# Patient Record
Sex: Male | Born: 1960 | Race: White | Hispanic: No | Marital: Married | Smoking: Never smoker
Health system: Southern US, Community
[De-identification: ages and names within clinical notes are randomized; demographics above are authoritative.]

## PROBLEM LIST (undated history)

## (undated) DIAGNOSIS — K7460 Unspecified cirrhosis of liver: Secondary | ICD-10-CM

## (undated) DIAGNOSIS — F1010 Alcohol abuse, uncomplicated: Secondary | ICD-10-CM

## (undated) DIAGNOSIS — I2510 Atherosclerotic heart disease of native coronary artery without angina pectoris: Secondary | ICD-10-CM

## (undated) DIAGNOSIS — E785 Hyperlipidemia, unspecified: Secondary | ICD-10-CM

## (undated) DIAGNOSIS — R0600 Dyspnea, unspecified: Secondary | ICD-10-CM

## (undated) DIAGNOSIS — E119 Type 2 diabetes mellitus without complications: Secondary | ICD-10-CM

## (undated) DIAGNOSIS — I10 Essential (primary) hypertension: Secondary | ICD-10-CM

## (undated) HISTORY — DX: Hyperlipidemia, unspecified: E78.5

## (undated) HISTORY — PX: NO PAST SURGERIES: SHX2092

## (undated) HISTORY — DX: Atherosclerotic heart disease of native coronary artery without angina pectoris: I25.10

## (undated) HISTORY — DX: Type 2 diabetes mellitus without complications: E11.9

---

## 2002-10-10 ENCOUNTER — Encounter: Payer: Self-pay | Admitting: Emergency Medicine

## 2002-10-10 ENCOUNTER — Emergency Department (HOSPITAL_COMMUNITY): Admission: AD | Admit: 2002-10-10 | Discharge: 2002-10-10 | Payer: Self-pay | Admitting: Emergency Medicine

## 2004-04-01 ENCOUNTER — Encounter: Admission: RE | Admit: 2004-04-01 | Discharge: 2004-04-01 | Payer: Self-pay | Admitting: Neurological Surgery

## 2004-04-02 ENCOUNTER — Encounter: Admission: RE | Admit: 2004-04-02 | Discharge: 2004-04-02 | Payer: Self-pay | Admitting: Neurological Surgery

## 2004-07-15 ENCOUNTER — Ambulatory Visit: Payer: Self-pay | Admitting: Family Medicine

## 2004-07-28 ENCOUNTER — Ambulatory Visit (HOSPITAL_BASED_OUTPATIENT_CLINIC_OR_DEPARTMENT_OTHER): Admission: RE | Admit: 2004-07-28 | Discharge: 2004-07-28 | Payer: Self-pay | Admitting: Family Medicine

## 2004-07-30 ENCOUNTER — Ambulatory Visit: Payer: Self-pay | Admitting: Internal Medicine

## 2004-10-23 ENCOUNTER — Ambulatory Visit: Payer: Self-pay | Admitting: Family Medicine

## 2004-10-30 ENCOUNTER — Ambulatory Visit: Payer: Self-pay | Admitting: Family Medicine

## 2018-07-28 ENCOUNTER — Emergency Department (HOSPITAL_COMMUNITY): Payer: Managed Care, Other (non HMO)

## 2018-07-28 ENCOUNTER — Inpatient Hospital Stay (HOSPITAL_COMMUNITY)
Admission: EM | Admit: 2018-07-28 | Discharge: 2018-08-06 | DRG: 897 | Disposition: A | Discharge status: Home Health | Payer: Managed Care, Other (non HMO) | Attending: Family Medicine | Admitting: Family Medicine

## 2018-07-28 ENCOUNTER — Other Ambulatory Visit: Payer: Self-pay

## 2018-07-28 ENCOUNTER — Encounter (HOSPITAL_COMMUNITY): Payer: Self-pay

## 2018-07-28 DIAGNOSIS — Z23 Encounter for immunization: Secondary | ICD-10-CM

## 2018-07-28 DIAGNOSIS — R17 Unspecified jaundice: Secondary | ICD-10-CM | POA: Diagnosis not present

## 2018-07-28 DIAGNOSIS — E785 Hyperlipidemia, unspecified: Secondary | ICD-10-CM | POA: Diagnosis present

## 2018-07-28 DIAGNOSIS — F10231 Alcohol dependence with withdrawal delirium: Principal | ICD-10-CM | POA: Diagnosis present

## 2018-07-28 DIAGNOSIS — R05 Cough: Secondary | ICD-10-CM | POA: Diagnosis present

## 2018-07-28 DIAGNOSIS — E871 Hypo-osmolality and hyponatremia: Secondary | ICD-10-CM | POA: Diagnosis present

## 2018-07-28 DIAGNOSIS — F10232 Alcohol dependence with withdrawal with perceptual disturbance: Secondary | ICD-10-CM | POA: Diagnosis present

## 2018-07-28 DIAGNOSIS — I10 Essential (primary) hypertension: Secondary | ICD-10-CM | POA: Diagnosis present

## 2018-07-28 DIAGNOSIS — K700 Alcoholic fatty liver: Secondary | ICD-10-CM | POA: Diagnosis present

## 2018-07-28 DIAGNOSIS — B9789 Other viral agents as the cause of diseases classified elsewhere: Secondary | ICD-10-CM | POA: Diagnosis not present

## 2018-07-28 DIAGNOSIS — R000 Tachycardia, unspecified: Secondary | ICD-10-CM | POA: Diagnosis not present

## 2018-07-28 DIAGNOSIS — R410 Disorientation, unspecified: Secondary | ICD-10-CM | POA: Diagnosis not present

## 2018-07-28 DIAGNOSIS — F10939 Alcohol use, unspecified with withdrawal, unspecified: Secondary | ICD-10-CM | POA: Diagnosis present

## 2018-07-28 DIAGNOSIS — R7401 Elevation of levels of liver transaminase levels: Secondary | ICD-10-CM

## 2018-07-28 DIAGNOSIS — R4182 Altered mental status, unspecified: Secondary | ICD-10-CM | POA: Diagnosis not present

## 2018-07-28 DIAGNOSIS — F10239 Alcohol dependence with withdrawal, unspecified: Secondary | ICD-10-CM | POA: Diagnosis present

## 2018-07-28 DIAGNOSIS — R740 Nonspecific elevation of levels of transaminase and lactic acid dehydrogenase [LDH]: Secondary | ICD-10-CM | POA: Diagnosis not present

## 2018-07-28 DIAGNOSIS — Z20828 Contact with and (suspected) exposure to other viral communicable diseases: Secondary | ICD-10-CM | POA: Diagnosis present

## 2018-07-28 DIAGNOSIS — F419 Anxiety disorder, unspecified: Secondary | ICD-10-CM | POA: Diagnosis present

## 2018-07-28 DIAGNOSIS — F10932 Alcohol use, unspecified with withdrawal with perceptual disturbance: Secondary | ICD-10-CM

## 2018-07-28 DIAGNOSIS — J069 Acute upper respiratory infection, unspecified: Secondary | ICD-10-CM | POA: Diagnosis present

## 2018-07-28 DIAGNOSIS — R030 Elevated blood-pressure reading, without diagnosis of hypertension: Secondary | ICD-10-CM

## 2018-07-28 DIAGNOSIS — F1020 Alcohol dependence, uncomplicated: Secondary | ICD-10-CM | POA: Diagnosis present

## 2018-07-28 HISTORY — DX: Alcohol abuse, uncomplicated: F10.10

## 2018-07-28 LAB — COMPREHENSIVE METABOLIC PANEL
ALT: 34 U/L (ref 0–44)
AST: 89 U/L — ABNORMAL HIGH (ref 15–41)
Albumin: 3.9 g/dL (ref 3.5–5.0)
Alkaline Phosphatase: 179 U/L — ABNORMAL HIGH (ref 38–126)
Anion gap: 12 (ref 5–15)
BUN: 10 mg/dL (ref 6–20)
CO2: 21 mmol/L — ABNORMAL LOW (ref 22–32)
Calcium: 10.2 mg/dL (ref 8.9–10.3)
Chloride: 98 mmol/L (ref 98–111)
Creatinine, Ser: 0.81 mg/dL (ref 0.61–1.24)
GFR calc Af Amer: >60 mL/min (ref >60)
GFR calc non Af Amer: >60 mL/min (ref >60)
Glucose, Bld: 132 mg/dL — ABNORMAL HIGH (ref 70–99)
Potassium: 3.6 mmol/L (ref 3.5–5.1)
Sodium: 131 mmol/L — ABNORMAL LOW (ref 135–145)
Total Bilirubin: 4.6 mg/dL — ABNORMAL HIGH (ref 0.3–1.2)
Total Protein: 8.5 g/dL — ABNORMAL HIGH (ref 6.5–8.1)

## 2018-07-28 LAB — CBC WITH DIFFERENTIAL/PLATELET
Abs Immature Granulocytes: 0.06 10*3/uL (ref 0.00–0.07)
Basophils Absolute: 0 10*3/uL (ref 0.0–0.1)
Basophils Relative: 1 %
Eosinophils Absolute: 0.2 10*3/uL (ref 0.0–0.5)
Eosinophils Relative: 2 %
HCT: 42 % (ref 39.0–52.0)
Hemoglobin: 14.3 g/dL (ref 13.0–17.0)
Immature Granulocytes: 1 %
Lymphocytes Relative: 11 %
Lymphs Abs: 0.7 10*3/uL (ref 0.7–4.0)
MCH: 34 pg (ref 26.0–34.0)
MCHC: 34 g/dL (ref 30.0–36.0)
MCV: 99.8 fL (ref 80.0–100.0)
Monocytes Absolute: 0.7 10*3/uL (ref 0.1–1.0)
Monocytes Relative: 11 %
Neutro Abs: 4.9 10*3/uL (ref 1.7–7.7)
Neutrophils Relative %: 74 %
Platelets: UNDETERMINED 10*3/uL (ref 150–400)
RBC: 4.21 MIL/uL — ABNORMAL LOW (ref 4.22–5.81)
RDW: 12.1 % (ref 11.5–15.5)
WBC: 6.6 10*3/uL (ref 4.0–10.5)
nRBC: 0 % (ref 0.0–0.2)

## 2018-07-28 LAB — ETHANOL: Alcohol, Ethyl (B): <10 mg/dL (ref <10)

## 2018-07-28 LAB — CBG MONITORING, ED: Glucose-Capillary: 131 mg/dL — ABNORMAL HIGH (ref 70–99)

## 2018-07-28 LAB — LIPID PANEL
Cholesterol: 224 mg/dL — ABNORMAL HIGH (ref 0–200)
HDL: 42 mg/dL (ref >40)
LDL Cholesterol: 162 mg/dL — ABNORMAL HIGH (ref 0–99)
Total CHOL/HDL Ratio: 5.3 RATIO
Triglycerides: 98 mg/dL (ref <150)
VLDL: 20 mg/dL (ref 0–40)

## 2018-07-28 MED ORDER — LACTATED RINGERS IV BOLUS
1000.0000 mL | Freq: Once | INTRAVENOUS | Status: AC
  Administered 2018-07-28: 1000 mL via INTRAVENOUS

## 2018-07-28 MED ORDER — LORAZEPAM 2 MG/ML IJ SOLN
1.0000 mg | Freq: Once | INTRAMUSCULAR | Status: AC
  Administered 2018-07-28: 17:00:00 1 mg via INTRAVENOUS
  Filled 2018-07-28: qty 1

## 2018-07-28 MED ORDER — LORAZEPAM 2 MG/ML IJ SOLN
2.0000 mg | INTRAMUSCULAR | Status: DC | PRN
  Administered 2018-07-28 – 2018-07-29 (×4): 2 mg via INTRAVENOUS
  Administered 2018-07-29: 3 mg via INTRAVENOUS
  Administered 2018-07-29 – 2018-07-31 (×7): 2 mg via INTRAVENOUS
  Filled 2018-07-28 (×2): qty 1
  Filled 2018-07-28: qty 2
  Filled 2018-07-28 (×11): qty 1

## 2018-07-28 MED ORDER — LORAZEPAM 2 MG/ML IJ SOLN
1.0000 mg | Freq: Once | INTRAMUSCULAR | Status: AC
  Administered 2018-07-28: 1 mg via INTRAVENOUS
  Filled 2018-07-28: qty 1

## 2018-07-28 MED ORDER — CHLORDIAZEPOXIDE HCL 25 MG PO CAPS: ORAL_CAPSULE | ORAL | 0 refills | Status: DC

## 2018-07-28 MED ORDER — LORAZEPAM 2 MG/ML IJ SOLN
1.0000 mg | Freq: Once | INTRAMUSCULAR | Status: AC
  Administered 2018-07-28: 19:00:00 1 mg via INTRAVENOUS
  Filled 2018-07-28: qty 1

## 2018-07-28 NOTE — ED Notes (Signed)
Pt reporting shortness of breat, a cough, and fevers since 07/24/18

## 2018-07-28 NOTE — H&P (Addendum)
[facility] Admission History and Physical Service Pager: [phone]  Patient name:  Leroy Bell Medical record number: [id number] Date of birth: 1960/05/13 Age: 58 y.o. Gender: male  Primary Care Provider: Patient, No Pcp Per Consultants: None Code Status: Full  Chief Complaint: Confusion, visual hallucinations, tremors, cough and fevers  Assessment and Plan: Leroy Bell is a 58 y.o. male presenting with confusion, visual hallucinations, tremors, cough and fevers at home in the setting of long history of alcohol abuse concerning for acute alcohol withdrawal. Patient has also reported cough, and fevers at home with self quarantined for the past few days suspicious for possible covid-19. PMH is significant for alcohol use disorder.  Acute alcohol withdrawal- last alcoholic drink on Sunday. patient actively tremulous and hallucinating/delirious/agitated while in ED. CIWA score 11>7>13.  Given 3 doses of Ativan and 2 liters LR in the ED. relevant labs significant for mild hyponatremia 131, AST 89, ALT 34, T bili 4.6, Alk Phos 179.  Physical exam showed jaundiced skin and sclera, obese abdomen negative for fluid wave, negative lower extremity edema, oriented x4, active tremor, negative nystagmus, active visual hallucinations and delirium. CXR showed no active cardiopulmonary process with an elevated left Hemidiaphragm.  Patient likely in acute alcohol withdrawal and needing admission to Step down unit for close monitoring of withdrawal symptoms and support for several days. No history of previous hospitalization for alcohol withdrawal or DTs.  May be difficult to assess tremor as a symptom of withdrawal 2/2 patient endorsing a chronic essential tremor. -Admit to stepdown, attending Dr. Mingo Amber -CIWA protocol, monitor score CCM consult prn. - Ativan with CIWA protocol - folate and thiamine - tylenol PRN - obtain alcohol level - Cmp and CBC  am - continuous cardiac  monitoring - social work consult once patient is stabilized to assist in drinking cessation - consider nicotine patch - reach out to wife for baseline information  Cough, Fevers at home, acute- no cough present during admission encounter.  Patient was afebrile since admission and appeared to have comfortable breathing on room air with 100% O2 sats despite being agitated from withdrawals. HEENT exam negative for infectious signs. Chest x-ray negative for active pulmonary disease. Patient reports fevers at home highest measured per patient 100.26F. He also endorses cough for a few days and reports wife had similar symptoms. Patient reports self isolation at home. He denies any know sick contact with covid patients or significant shortness of breath, however patient is a truck driver and given occupation and symptoms is at risk for exposure. Etiology likely viral upper respiratory infection that was associated with fever so will r/u COVID. Due to patient not having primary care follow-up for many years, and having elevated glucose on admission, would also recommend screening for diabetes as this is a risk factor for higher morbidity with covid coinfection. - Novel covid testing - Airborne and droplet precautions - Hemoglobin A1c --Follow up on RVP  #Transaminitis Patient with a long and significant history of alcohol abuse. He present with elevated AST and ALT in the classic pattern of 2:1 consistent with alcohol abuse. Patient also exhibited some scleral icterus consistent with his elevated T Bili of 4.6 and Alk Phos 179. CXR showed and elevated left Hemidiaphragm which also could be consistent with enlarged liver though will expect right hemidiaphragm elevation. However left hemi diaphragm elevation still possible given liver anatomy.  --Follow up on am CMP --Would consider GI consult given LFTs findings  --Continue Thiamine and folate --CIWA protocol  per above  #Hyponatremia On admission Na+ 131.  Patient with a known history of alcohol abuse. Suspect low sodium likely secondary to beer potomania and inappropriate diet. --Follow up on CMP --Continue supportive measures --Thiamine and Folate --Regular diet   Elevated blood pressure-patient denies history of hypertension and did not take any meds at home.  Hypertensive in emergency room.  Patient likely has essential hypertension undiagnosed since he does not follow with primary care, however, blood pressure most likely acutely elevated due to withdrawal symptoms.  Patient also tachycardic on admission which improved with Ativan treatment. - Monitor blood pressure - Set up patient for outpatient follow-up at discharge  FEN/GI: Normal diet Prophylaxis: lovenox  Disposition: admit to stepdown  History of Present Illness:  Leroy Bell is a 58 y.o. male presenting with history significant for acute alcohol withdrawal and recent upper respiratory infections symptoms.  Patient is unreliable historian due to withdrawal symptoms- giving inconsistent responses to questions. Does not have visitor with him due to isolation restrictions.  Respiratory infection-patient states he had 2 to 3 days of dry cough and was trying to isolate in his own bedroom at home.  Has not had any cough since Tuesday. his wife also had a cough worse than him but she is a smoker and has a chronic cough. Patient denies smoking or illicit drug use but does use chewing tobacco. He states that he has had a fever at home as high as 100.4 measured orally.  He denies shortness of breath, chest pain, rhinorrhea, odynophagia, ear pain, diarrhea, constipation, abdominal pain.  He denies having any sick contacts or any known Covid exposure.  Works as a Administrator.   Alcohol withdrawal-patient states that he drinks every day.  His last drink was on Sunday. Averages 6-12 beers per day.  The most he drinks in a day is 17 beers.  He sometimes starts drinking upon wakening in the  morning on "football "days.  He states that he has tried to stop drinking in the past.  He has stopped for multiple days before cold [place].  He has never been hospitalized for withdrawals in the past.  Denies any history of seizures or withdrawal symptoms.  Denies having any negative side effects of alcohol use to motivate his cessation.  He was just tired of drinking. When questioning the patient about hallucinations he denied having them but then started talking to people in the room who were not there multiple times during encounter.  ED physician signed out that patient admitted to multiple hallucinations at home.  Patient was not able to answer questions appropriately when investigating his withdrawal symptoms.  On initial entrance into the room patient was not oriented to self.  Upon reorientation patient was able to be oriented x4.  Patient received third dose of Ativan during encounter.  He had an active tremor in both hands and endorsed having chronic essential tremor.  Review Of Systems: Per HPI with the following additions:   Review of Systems  Constitutional: Positive for fever.  HENT: Negative for congestion, ear pain, sinus pain and sore throat.   Respiratory: Positive for cough. Negative for hemoptysis, sputum production and shortness of breath.   Cardiovascular: Negative for chest pain and palpitations.  Gastrointestinal: Negative for abdominal pain, nausea and vomiting.  Neurological: Positive for tremors. Negative for seizures.  Psychiatric/Behavioral: Positive for hallucinations and substance abuse.    Patient Active Problem List   Diagnosis Date Noted  . Alcohol withdrawal ([place]) 07/28/2018  Past Medical History: Past Medical History:  Diagnosis Date  . Alcohol abuse     Past Surgical History: Past Surgical History:  Procedure Laterality Date  . NECK SURGERY      Social History: Lives at home with wife.  Never smoker Positive chewing tobacco use Denies illicit  drug use Drinks 6-12 beers each day with as many as 17  Please also refer to relevant sections of EMR.  Family History: Non-contributory   Allergies and Medications: No Known Allergies  Objective: BP (!) 161/142   Pulse (!) 122   Temp 99.5 F (37.5 C) (Oral)   Resp (!) 34   SpO2 98%  Exam: General: agitated, delirious, cooperative Eyes: negative nystagmus, positive mild scleral icterus ENTM: negative nare or oropharynx edema, exudates or erythema Neck: negative cervico lymphadenitis or tenderness to palpation Cardiovascular: negative JVD, negative LE edema, quick capillary refill Respiratory: no increased WOB, no audible wheezing Gastrointestinal: abdomen obese and soft, negative fluid wave, negative hepatomegaly on exam which was limited 2/2 body habitus  MSK: normal appearing development Derm: no rashes or apparent lesions, but patient did have dried blood on hands and face without obvious source and patient did not know where it came from Neuro: negative focal deficits, negative nystagmus, bilateral tremor hands Psych: positive hallucinations apparent by patient talking about and to other people in the room who were not there  Labs and Imaging: CBC BMET  Recent Labs  Lab 07/28/18 1644  WBC 6.6  HGB 14.3  HCT 42.0  PLT PLATELET CLUMPS NOTED ON SMEAR, UNABLE TO ESTIMATE   Recent Labs  Lab 07/28/18 1644  NA 131*  K 3.6  CL 98  CO2 21*  BUN 10  CREATININE 0.81  GLUCOSE 132*  CALCIUM 10.2     Dg Chest Portable 1 View  Result Date: 07/28/2018 CLINICAL DATA:  Cough, chills, fever, and runny nose for 1 week. Hypertension. EXAM: PORTABLE CHEST 1 VIEW COMPARISON:  None. FINDINGS: Elevated LEFT hemidiaphragm, likely chronic. Cardiomegaly. No consolidation or edema. No effusion pneumothorax. Bones unremarkable. IMPRESSION: Cardiomegaly. No active disease. 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 Electronically Signed   By: Staci Righter M.D.   On: 07/28/2018 17:52    I have seen and evaluated the patient with Dr. Ouida Sills. I am in agreement with the note above in its revised form. My additions are in blue.  Marjie Skiff, MD Family Medicine, PGY-3  Richarda Osmond, DO 07/28/2018, 9:52 PM PGY-1, [place] Intern pager: [phone], text pages welcome

## 2018-07-28 NOTE — ED Provider Notes (Addendum)
[facility] EMERGENCY DEPARTMENT Provider Note   CSN: [id number] Arrival date & time: 07/28/18  1627    History   Chief Complaint Chief Complaint  Patient presents with  . Hallucinations  . Alcohol Problem    HPI Leroy Bell is a 58 y.o. male with history of alcohol abuse and untreated hypertension who presents to the emergency department concerned about visual hallucinations that started yesterday.  Patient is a daily drinker of at least 6-8 beers a day however stopped drinking 5 days ago.  He states he is had 1 beer 4 days ago since he initially tried stop.  He states that 3 days ago he noticed a fever as well as dry hacking cough.  He denies any shortness of breath, sick contacts, specifically no known COVID-19 exposures.  He states that he has seen individuals packing up his stuff as well as children running around his house which his wife states have not actually been present.  He denies any chest pain, shortness of breath, nausea/vomiting/diarrhea, dysuria, hematuria, or changes in bowel habits.      Illness  Severity:  Severe Onset quality:  Gradual Duration:  5 days Timing:  Constant Progression:  Worsening Chronicity:  New Associated symptoms: cough and fever   Associated symptoms: no abdominal pain, no chest pain, no ear pain, no rash, no shortness of breath, no sore throat and no vomiting     Past Medical History:  Diagnosis Date  . Alcohol abuse     There are no active problems to display for this patient.   Past Surgical History:  Procedure Laterality Date  . NECK SURGERY          Home Medications    Prior to Admission medications   Medication Sig Start Date End Date Taking? Authorizing Provider  chlordiazePOXIDE (LIBRIUM) 25 MG capsule  PO TID x 1D, then 25-50mg  PO BID X 1D, then 25-50mg  PO QD X 1D 07/28/18   Leonette Monarch, MD    Family History History reviewed. No pertinent family history.  Social History Social History    Tobacco Use  . Smoking status: Never Smoker  . Smokeless tobacco: Never Used  Substance Use Topics  . Alcohol use: Not Currently    Comment: stopped on 07/24/18  . Drug use: Never     Allergies   Patient has no known allergies.   Review of Systems Review of Systems  Constitutional: Positive for fever. Negative for chills.  HENT: Negative for ear pain and sore throat.   Eyes: Negative for pain and visual disturbance.  Respiratory: Positive for cough. Negative for shortness of breath.   Cardiovascular: Negative for chest pain and palpitations.  Gastrointestinal: Negative for abdominal pain and vomiting.  Genitourinary: Negative for dysuria and hematuria.  Musculoskeletal: Negative for arthralgias and back pain.  Skin: Negative for color change and rash.  Neurological: Negative for seizures and syncope.  Psychiatric/Behavioral: Positive for hallucinations.  All other systems reviewed and are negative.    Physical Exam Updated Vital Signs BP (!) 146/124   Pulse 99   Temp 99.5 F (37.5 C) (Oral)   Resp (!) 41   SpO2 98%   Physical Exam Vitals signs and nursing note reviewed.  Constitutional:      Appearance: He is well-developed.  HENT:     Head: Normocephalic and atraumatic.  Eyes:     Conjunctiva/sclera: Conjunctivae normal.  Neck:     Musculoskeletal: Neck supple.  Cardiovascular:     Rate  and Rhythm: Regular rhythm. Tachycardia present.     Heart sounds: No murmur.  Pulmonary:     Effort: Pulmonary effort is normal. No respiratory distress.     Breath sounds: Normal breath sounds.  Abdominal:     Palpations: Abdomen is soft.     Tenderness: There is no abdominal tenderness.  Musculoskeletal: Normal range of motion.     Right lower leg: No edema.     Left lower leg: No edema.  Skin:    General: Skin is warm and dry.  Neurological:     Mental Status: He is alert.     Comments: Alert and oriented x3.  Cranial nerves II through XII intact.  5 out of 5  strength in bilateral upper and lower extremities.  Tremulous in all 4 extremities.  Able to perform finger-to-nose without overshooting.  Psychiatric:     Comments: Does not appear to be responding to internal stimuli.      ED Treatments / Results  Labs (all labs ordered are listed, but only abnormal results are displayed) Labs Reviewed  CBC WITH DIFFERENTIAL/PLATELET - Abnormal; Notable for the following components:      Result Value   RBC 4.21 (*)    All other components within normal limits  COMPREHENSIVE METABOLIC PANEL - Abnormal; Notable for the following components:   Sodium 131 (*)    CO2 21 (*)    Glucose, Bld 132 (*)    Total Protein 8.5 (*)    AST 89 (*)    Alkaline Phosphatase 179 (*)    Total Bilirubin 4.6 (*)    All other components within normal limits  CBG MONITORING, ED - Abnormal; Notable for the following components:   Glucose-Capillary 131 (*)    All other components within normal limits    EKG EKG Interpretation  Date/Time:  Thursday July 28 2018 16:29:50 EDT Ventricular Rate:  103 PR Interval:    QRS Duration: 108 QT Interval:  368 QTC Calculation: 482 R Axis:   -66 Text Interpretation:  Pacemaker spikes or artifacts Sinus tachycardia Left anterior fascicular block Abnormal R-wave progression, late transition Borderline prolonged QT interval Confirmed by Jacalyn Lefevre [phone]) on 07/28/2018 4:38:48 PM   Radiology Dg Chest Portable 1 View  Result Date: 07/28/2018 CLINICAL DATA:  Cough, chills, fever, and runny nose for 1 week. Hypertension. EXAM: PORTABLE CHEST 1 VIEW COMPARISON:  None. FINDINGS: Elevated LEFT hemidiaphragm, likely chronic. Cardiomegaly. No consolidation or edema. No effusion pneumothorax. Bones unremarkable. IMPRESSION: Cardiomegaly. No active disease. 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 Electronically Signed   By: Elsie Stain M.D.   On: 07/28/2018 17:52    Procedures Procedures (including critical care time)   Medications Ordered in ED Medications  LORazepam (ATIVAN) injection 1 mg (has no administration in time range)  LORazepam (ATIVAN) injection 1 mg (1 mg Intravenous Given 07/28/18 1712)  lactated ringers bolus 1,000 mL (0 mLs Intravenous Stopped 07/28/18 1857)  lactated ringers bolus 1,000 mL (0 mLs Intravenous Stopped 07/28/18 1930)  LORazepam (ATIVAN) injection 1 mg (1 mg Intravenous Given 07/28/18 1857)     Initial Impression / Assessment and Plan / ED Course  I have reviewed the triage vital signs and the nursing notes.  Pertinent labs & imaging results that were available during my care of the patient were reviewed by me and considered in my medical decision making (see chart for details).        Patient is a 58 year old male with untreated hypertension and chronic alcohol abuse  who presents to the emergency department complaining of visual hallucinations over the course of the last 2 days as well as fever and cough for the past 3 days.  Of note, the patient has history of extensive alcohol use and decided to stop 5 days ago.  He states his last single drink was 4 days ago.  His wife was concerned that he was seeing visualizations in their home that were not actually there.  On initial evaluation of the patient he was afebrile, tachycardic to 102, hypertensive to 178/120, tachypneic, satting appropriately on room air.  Physical exam as detailed above which is remarkable for anxious appearing middle-aged male with lungs are clear to auscultation and tachycardia with no murmurs rubs or gallops present.  Patient's neurologic exam is largely unremarkable with the exception of baseline tremors and which he states are secondary to chronic back issues and are not new or changed in any way over the course of last few days.  EKG shows sinus tachycardia at 103 bpm with no evidence of ST or T wave abnormalities to suggest ischemia.   POC glucose WNL.  Clinical picture concerning for acute EtOH  withdrawal. Patient given 1mg  of Ativan IV and IV fluids for symptomatic relief.   Labs remarkable for mild hyponatremia at 131.  Bicarb slightly decreased to 21 however anion gap is within normal limits.  No other significant metabolic or electrolyte derangements are present.  Chest x-ray shows cardiomegaly however no active disease.  No evidence of consolidation not be concerning for pneumonia.  Specifically no consolidations in a pattern concerning for COVID-19.  Patient still had slight persistent tachycardia so additional IV fluids and 1 mg of IV Ativan were given.  Tachycardia improved.  He still remains mildly hypertensive however with his history of chronic hypertension with medication non compliance, this is not unexpected.  As the patient had no altered sensorium or confusion while emergency department and was hemodynamically stable initial plan was for discharge home, however he became acutely confused stating that he had to take a train to get home.  Patient got his own IV and was wandering the halls.  He was redirected back to his room and IV access was once again obtained.  Additional dose of IV Ativan was given and case was discussed with family medicine Dr. Dareen Piano.  He will be admitted to family medicine for ongoing evaluation and care of acute alcohol withdrawal.  GERAN LAUKAITIS was evaluated in Emergency Department on 07/28/2018 for the symptoms described in the history of present illness. He was evaluated in the context of the global COVID-19 pandemic, which necessitated consideration that the patient might be at risk for infection with the SARS-CoV-2 virus that causes COVID-19. Institutional protocols and algorithms that pertain to the evaluation of patients at risk for COVID-19 are in a state of rapid change based on information released by regulatory bodies including the CDC and federal and state organizations. These policies and algorithms were followed during the patient's care in  the ED.  Final Clinical Impressions(s) / ED Diagnoses   Final diagnoses:  Alcohol withdrawal syndrome with perceptual disturbance ([name])  Viral URI with cough    ED Discharge Orders         Ordered    chlordiazePOXIDE (LIBRIUM) 25 MG capsule     07/28/18 1935              Leonette Monarch, MD 07/28/18 2151    Jacalyn Lefevre, MD 07/28/18 2217

## 2018-07-28 NOTE — ED Notes (Signed)
Admitting at bedside 

## 2018-07-28 NOTE — ED Triage Notes (Signed)
Pt here by EMS after wife called stating since last night he has been hallucinating since yesterday.  Reports pt going out to cars in the middle of the night in fear someone was stealing them, and today talking to inanimate objects.

## 2018-07-28 NOTE — ED Notes (Signed)
Pt at door again very confused and uneasy on feet.  Pt redirected to bed and having severe tremors in arms and legs even while standing.  Pt disoriented and unable to tell date, time or situation.  Ativan to be given

## 2018-07-28 NOTE — Discharge Instructions (Addendum)
Pneumococcal Vaccine, Polyvalent solution for injection What is this medicine? PNEUMOCOCCAL VACCINE, POLYVALENT (NEU mo KOK al vak SEEN, pol ee VEY luhnt) is a vaccine to prevent pneumococcus bacteria infection. These bacteria are a major cause of ear infections, Strep throat infections, and serious pneumonia, meningitis, or blood infections worldwide. These vaccines help the body to produce antibodies (protective substances) that help your body defend against these bacteria. This vaccine is recommended for people 56 years of age and older with health problems. It is also recommended for all adults over 56 years old. This vaccine will not treat an infection. This medicine may be used for other purposes; ask your health care provider or pharmacist if you have questions. COMMON BRAND NAME(S): Pneumovax 23 What should I tell my health care provider before I take this medicine? They need to know if you have any of these conditions: -bleeding problems -bone marrow or organ transplant -cancer, Hodgkin's disease -fever -infection -immune system problems -low platelet count in the blood -seizures -an unusual or allergic reaction to pneumococcal vaccine, diphtheria toxoid, other vaccines, latex, other medicines, foods, dyes, or preservatives -pregnant or trying to get pregnant -breast-feeding How should I use this medicine? This vaccine is for injection into a muscle or under the skin. It is given by a health care professional. A copy of Vaccine Information Statements will be given before each vaccination. Read this sheet carefully each time. The sheet may change frequently. Talk to your pediatrician regarding the use of this medicine in children. While this drug may be prescribed for children as young as 56 years of age for selected conditions, precautions do apply. Overdosage: If you think you have taken too much of this medicine contact a poison control center or emergency room at once. NOTE: This  medicine is only for you. Do not share this medicine with others. What if I miss a dose? It is important not to miss your dose. Call your doctor or health care professional if you are unable to keep an appointment. What may interact with this medicine? -medicines for cancer chemotherapy -medicines that suppress your immune function -medicines that treat or prevent blood clots like warfarin, enoxaparin, and dalteparin -steroid medicines like prednisone or cortisone This list may not describe all possible interactions. Give your health care provider a list of all the medicines, herbs, non-prescription drugs, or dietary supplements you use. Also tell them if you smoke, drink alcohol, or use illegal drugs. Some items may interact with your medicine. What should I watch for while using this medicine? Mild fever and pain should go away in 3 days or less. Report any unusual symptoms to your doctor or health care professional. What side effects may I notice from receiving this medicine? Side effects that you should report to your doctor or health care professional as soon as possible: -allergic reactions like skin rash, itching or hives, swelling of the face, lips, or tongue -breathing problems -confused -fever over 102 degrees F -pain, tingling, numbness in the hands or feet -seizures -unusual bleeding or bruising -unusual muscle weakness Side effects that usually do not require medical attention (report to your doctor or health care professional if they continue or are bothersome): -aches and pains -diarrhea -fever of 102 degrees F or less -headache -irritable -loss of appetite -pain, tender at site where injected -trouble sleeping This list may not describe all possible side effects. Call your doctor for medical advice about side effects. You may report side effects to FDA at 1-800-FDA-1088. Where should  I keep my medicine? This does not apply. This vaccine is given in a clinic, pharmacy,  doctor's office, or other health care setting and will not be stored at home. NOTE: This sheet is a summary. It may not cover all possible information. If you have questions about this medicine, talk to your doctor, pharmacist, or health care provider.  2019 Elsevier/Gold Standard (2007-11-18 14:32:37)    Person Under Monitoring Name: Leroy Bell [facility]  Location: [address] [place] [address]   Infection Prevention Recommendations for Individuals Confirmed to have, or Being Evaluated for, 2019 Novel Coronavirus (COVID-19) Infection Who Receive Care at Home  Individuals who are confirmed to have, or are being evaluated for, COVID-19 should follow the prevention steps below until a healthcare provider or local or state health department says they can return to normal activities.  Stay home except to get medical care You should restrict activities outside your home, except for getting medical care. Do not go to work, school, or public areas, and do not use public transportation or taxis.  Call ahead before visiting your doctor Before your medical appointment, call the healthcare provider and tell them that you have, or are being evaluated for, COVID-19 infection. This will help the healthcare providers office take steps to keep other people from getting infected. Ask your healthcare provider to call the local or state health department.  Monitor your symptoms Seek prompt medical attention if your illness is worsening (e.g., difficulty breathing). Before going to your medical appointment, call the healthcare provider and tell them that you have, or are being evaluated for, COVID-19 infection. Ask your healthcare provider to call the local or state health department.  Wear a facemask You should wear a facemask that covers your nose and mouth when you are in the same room with other people and when you visit a healthcare provider. People who live with or visit you should also  wear a facemask while they are in the same room with you.  Separate yourself from other people in your home As much as possible, you should stay in a different room from other people in your home. Also, you should use a separate bathroom, if available.  Avoid sharing household items You should not share dishes, drinking glasses, cups, eating utensils, towels, bedding, or other items with other people in your home. After using these items, you should wash them thoroughly with soap and water.  Cover your coughs and sneezes Cover your mouth and nose with a tissue when you cough or sneeze, or you can cough or sneeze into your sleeve. Throw used tissues in a lined trash can, and immediately wash your hands with soap and water for at least 20 seconds or use an alcohol-based hand rub.  Wash your [employer] your hands often and thoroughly with soap and water for at least 20 seconds. You can use an alcohol-based hand sanitizer if soap and water are not available and if your hands are not visibly dirty. Avoid touching your eyes, nose, and mouth with unwashed hands.   Prevention Steps for Caregivers and Household Members of Individuals Confirmed to have, or Being Evaluated for, COVID-19 Infection Being Cared for in the Home  If you live with, or provide care at home for, a person confirmed to have, or being evaluated for, COVID-19 infection please follow these guidelines to prevent infection:  Follow healthcare providers instructions Make sure that you understand and can help the patient follow any healthcare provider instructions for  all care.  Provide for the patients basic needs You should help the patient with basic needs in the home and provide support for getting groceries, prescriptions, and other personal needs.  Monitor the patients symptoms If they are getting sicker, call his or her medical provider and tell them that the patient has, or is being evaluated for, COVID-19  infection. This will help the healthcare providers office take steps to keep other people from getting infected. Ask the healthcare provider to call the local or state health department.  Limit the number of people who have contact with the patient  If possible, have only one caregiver for the patient.  Other household members should stay in another home or place of residence. If this is not possible, they should stay  in another room, or be separated from the patient as much as possible. Use a separate bathroom, if available.  Restrict visitors who do not have an essential need to be in the home.  Keep older adults, very young children, and other sick people away from the patient Keep older adults, very young children, and those who have compromised immune systems or chronic health conditions away from the patient. This includes people with chronic heart, lung, or kidney conditions, diabetes, and cancer.  Ensure good ventilation Make sure that shared spaces in the home have good air flow, such as from an air conditioner or an opened window, weather permitting.  Wash your hands often  Wash your hands often and thoroughly with soap and water for at least 20 seconds. You can use an alcohol based hand sanitizer if soap and water are not available and if your hands are not visibly dirty.  Avoid touching your eyes, nose, and mouth with unwashed hands.  Use disposable paper towels to dry your hands. If not available, use dedicated cloth towels and replace them when they become wet.  Wear a facemask and gloves  Wear a disposable facemask at all times in the room and gloves when you touch or have contact with the patients blood, body fluids, and/or secretions or excretions, such as sweat, saliva, sputum, nasal mucus, vomit, urine, or feces.  Ensure the mask fits over your nose and mouth tightly, and do not touch it during use.  Throw out disposable facemasks and gloves after using them. Do  not reuse.  Wash your hands immediately after removing your facemask and gloves.  If your personal clothing becomes contaminated, carefully remove clothing and launder. Wash your hands after handling contaminated clothing.  Place all used disposable facemasks, gloves, and other waste in a lined container before disposing them with other household waste.  Remove gloves and wash your hands immediately after handling these items.  Do not share dishes, glasses, or other household items with the patient  Avoid sharing household items. You should not share dishes, drinking glasses, cups, eating utensils, towels, bedding, or other items with a patient who is confirmed to have, or being evaluated for, COVID-19 infection.  After the person uses these items, you should wash them thoroughly with soap and water.  Wash laundry thoroughly  Immediately remove and wash clothes or bedding that have blood, body fluids, and/or secretions or excretions, such as sweat, saliva, sputum, nasal mucus, vomit, urine, or feces, on them.  Wear gloves when handling laundry from the patient.  Read and follow directions on labels of laundry or clothing items and detergent. In general, wash and dry with the warmest temperatures recommended on the label.  Clean all  areas the individual has used often  Clean all touchable surfaces, such as counters, tabletops, doorknobs, bathroom fixtures, toilets, phones, keyboards, tablets, and bedside tables, every day. Also, clean any surfaces that may have blood, body fluids, and/or secretions or excretions on them.  Wear gloves when cleaning surfaces the patient has come in contact with.  Use a diluted bleach solution (e.g., dilute bleach with 1 part bleach and 10 parts water) or a household disinfectant with a label that says EPA-registered for coronaviruses. To make a bleach solution at home, add 1 tablespoon of bleach to 1 quart (4 cups) of water. For a larger supply, add  cup  of bleach to 1 gallon (16 cups) of water.  Read labels of cleaning products and follow recommendations provided on product labels. Labels contain instructions for safe and effective use of the cleaning product including precautions you should take when applying the product, such as wearing gloves or eye protection and making sure you have good ventilation during use of the product.  Remove gloves and wash hands immediately after cleaning.  Monitor yourself for signs and symptoms of illness Caregivers and household members are considered close contacts, should monitor their health, and will be asked to limit movement outside of the home to the extent possible. Follow the monitoring steps for close contacts listed on the symptom monitoring form.   ? If you have additional questions, contact your local health department or call the epidemiologist on call at [phone] (available 24/7). ? This guidance is subject to change. For the most up-to-date guidance from [facility], please refer to their website: TripMetro.huhttps://www.cdc.gov/coronavirus/2019-ncov/hcp/guidance-prevent-spread.html

## 2018-07-28 NOTE — ED Notes (Signed)
[facility] [phone] please call pts wife

## 2018-07-28 NOTE — ED Notes (Signed)
Pt came to the door holding wallet out and asking to be taken home.  Pt redirected to the bed.  Pt made aware that we are waiting for a bed upstairs.  Pt asked if his wife was getting a bed as well.  Pt redirected that he was being admitted overnight to the hospital.  Pt agreeable to rest in the bed.

## 2018-07-28 NOTE — ED Notes (Signed)
Pt became very disoriented prior to discharge.  Pt found by EMT wandering halls after pulling IVs out.  Pt states he needs to go to the train station to go to a ball game in [place].

## 2018-07-28 NOTE — ED Notes (Signed)
ED Provider at bedside. 

## 2018-07-29 DIAGNOSIS — R030 Elevated blood-pressure reading, without diagnosis of hypertension: Secondary | ICD-10-CM

## 2018-07-29 DIAGNOSIS — F10232 Alcohol dependence with withdrawal with perceptual disturbance: Secondary | ICD-10-CM

## 2018-07-29 DIAGNOSIS — B9789 Other viral agents as the cause of diseases classified elsewhere: Secondary | ICD-10-CM

## 2018-07-29 DIAGNOSIS — R000 Tachycardia, unspecified: Secondary | ICD-10-CM

## 2018-07-29 DIAGNOSIS — J069 Acute upper respiratory infection, unspecified: Secondary | ICD-10-CM

## 2018-07-29 LAB — COMPREHENSIVE METABOLIC PANEL
ALT: 30 U/L (ref 0–44)
AST: 75 U/L — ABNORMAL HIGH (ref 15–41)
Albumin: 3.5 g/dL (ref 3.5–5.0)
Alkaline Phosphatase: 146 U/L — ABNORMAL HIGH (ref 38–126)
Anion gap: 9 (ref 5–15)
BUN: 10 mg/dL (ref 6–20)
CO2: 20 mmol/L — ABNORMAL LOW (ref 22–32)
Calcium: 9.7 mg/dL (ref 8.9–10.3)
Chloride: 107 mmol/L (ref 98–111)
Creatinine, Ser: 0.83 mg/dL (ref 0.61–1.24)
GFR calc Af Amer: >60 mL/min (ref >60)
GFR calc non Af Amer: >60 mL/min (ref >60)
Glucose, Bld: 112 mg/dL — ABNORMAL HIGH (ref 70–99)
Potassium: 3.8 mmol/L (ref 3.5–5.1)
Sodium: 136 mmol/L (ref 135–145)
Total Bilirubin: 5.5 mg/dL — ABNORMAL HIGH (ref 0.3–1.2)
Total Protein: 7.3 g/dL (ref 6.5–8.1)

## 2018-07-29 LAB — BILIRUBIN, DIRECT: Bilirubin, Direct: 1.8 mg/dL — ABNORMAL HIGH (ref 0.0–0.2)

## 2018-07-29 LAB — CBC
HCT: 38.1 % — ABNORMAL LOW (ref 39.0–52.0)
Hemoglobin: 12.9 g/dL — ABNORMAL LOW (ref 13.0–17.0)
MCH: 33.9 pg (ref 26.0–34.0)
MCHC: 33.9 g/dL (ref 30.0–36.0)
MCV: 100 fL (ref 80.0–100.0)
Platelets: 110 10*3/uL — ABNORMAL LOW (ref 150–400)
RBC: 3.81 MIL/uL — ABNORMAL LOW (ref 4.22–5.81)
RDW: 12.3 % (ref 11.5–15.5)
WBC: 5.5 10*3/uL (ref 4.0–10.5)
nRBC: 0 % (ref 0.0–0.2)

## 2018-07-29 LAB — RESPIRATORY PANEL BY PCR

## 2018-07-29 LAB — HEMOGLOBIN A1C
Hgb A1c MFr Bld: 5.7 % — ABNORMAL HIGH (ref 4.8–5.6)
Mean Plasma Glucose: 116.89 mg/dL

## 2018-07-29 LAB — HIV ANTIBODY (ROUTINE TESTING W REFLEX): HIV Screen 4th Generation wRfx: NONREACTIVE

## 2018-07-29 LAB — MRSA PCR SCREENING: MRSA by PCR: NEGATIVE

## 2018-07-29 MED ORDER — FOLIC ACID 1 MG PO TABS
1.0000 mg | ORAL_TABLET | Freq: Every day | ORAL | Status: DC
  Administered 2018-07-29 – 2018-08-06 (×9): 1 mg via ORAL
  Filled 2018-07-29 (×10): qty 1

## 2018-07-29 MED ORDER — ACETAMINOPHEN 325 MG PO TABS
650.0000 mg | ORAL_TABLET | Freq: Four times a day (QID) | ORAL | Status: DC | PRN
  Administered 2018-08-05: 650 mg via ORAL
  Filled 2018-07-29: qty 2

## 2018-07-29 MED ORDER — VITAMIN B-1 100 MG PO TABS
100.0000 mg | ORAL_TABLET | Freq: Every day | ORAL | Status: DC
  Administered 2018-07-29 – 2018-08-06 (×9): 100 mg via ORAL
  Filled 2018-07-29 (×9): qty 1

## 2018-07-29 MED ORDER — CHLORDIAZEPOXIDE HCL 25 MG PO CAPS
50.0000 mg | ORAL_CAPSULE | Freq: Four times a day (QID) | ORAL | Status: DC
  Administered 2018-07-29 – 2018-07-30 (×5): 50 mg via ORAL
  Filled 2018-07-29 (×5): qty 2

## 2018-07-29 MED ORDER — ACETAMINOPHEN 650 MG RE SUPP: 650.0000 mg | Freq: Four times a day (QID) | RECTAL | Status: DC | PRN

## 2018-07-29 MED ORDER — ENOXAPARIN SODIUM 40 MG/0.4ML ~~LOC~~ SOLN
40.0000 mg | SUBCUTANEOUS | Status: DC
  Administered 2018-07-29 – 2018-08-06 (×9): 40 mg via SUBCUTANEOUS
  Filled 2018-07-29 (×9): qty 0.4

## 2018-07-29 NOTE — Progress Notes (Signed)
Received to room 2W32 from ER via stretcher. Assisted to bed and positioned for comfort. Attempted to orient to room, bed and unit but patient is confused and disoriented, mildly agitated and tremulous. CHG bath done.

## 2018-07-29 NOTE — Progress Notes (Signed)
Family Medicine Teaching Service Daily Progress Note Intern Pager: [phone]  Patient name: Leroy Bell [facility] Medical record number: [id number] Date of birth: 09-Jan-1961 Age: 58 y.o. Gender: male  Primary Care Provider: Patient, No Pcp Per Consultants: none Code Status: full  Pt Overview and Major Events to Date:  4/02: Admit for EtOH withdrawal, h/o dry cough+fever at home > COVID testing sent 4/03: RVP neg, awaiting COVID, added scd Librium 50 mg q6h given persistent Ativan use and CIWA 22  Assessment and Plan: Leroy Bell is a 58 y.o. male p/w confusion, visual hallucinations, tremors in the setting of EtOH withdrawal also self-reporting cough and fevers at home. PMH significant for alcohol use disorder.  1.  Alcohol withdrawal syndrome: Acute with visual hallucinations.  No seizures.  Hemodynamically stable.  Last drink reported 6 days ago, EtOH level negative on arrival.  CIWA persistently elevated at 22 despite PRN Ativan. - Will remain on progressive floor, may need ICU level care if CIWA remain >20 - Initiating scheduled Librium 50 mg every 6 hours in addition to Ativan 2-3 mg based on CIWA protocol - Daily labs including CBC, CMET, and Mg - Continue supportive measures, folate and thiamine - CSW to follow-up drinking cessation following stabilization  2.  SARS-CoV-2 virus PUI: History of subjective fever and dry cough at home with self quarantine.  No known exposure.  Patient is a Administrator.  No history of SOB.  Negative RVP. - Novel coronavirus testing pending - Airborne and droplet precaution  3.  Elevated transaminase, alk phosphatase, and bilirubin: No prior labs to compare.  AST: ALT ratio consistent with alcohol use disorder which is likely contributing to elevated alk phosphatase and bilirubin.  Bili has some degree of liver impairment given significant alcohol use.  Did have a left hemidiaphragm which is likely chronic possibly related to enlarged liver. - Consider  GI consult and RUQ U/S - Continue monitoring LFTs - Checking direct bilirubin level, HCV - HIV antibody pending  4.  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  Pending follow-up sodium from admission at 131. - Continue monitoring sodium - Continue supportive measures and regular diet  5.  Sinus tachycardia: Intermittent.  Likely secondary to alcohol withdrawal syndrome.  No signs of ACS for blocks on EKG. - See plan for alcohol withdrawal syndrome - Telemetry  6.  Elevated blood pressure: Systolic elevated at 829F yet stable.  No history of hypertension.  Likely secondary to alcohol withdrawal syndrome. -See plan for alcohol withdrawal syndrome  7.  Hyperlipidemia: LDL elevated at 162. - Could consider statin therapy but would benefit from evaluation of hypertension while not in the setting of alcohol withdrawal syndrome - Obtaining A1c for risk stratification  FEN/GI: regular diet Prophylaxis: Lovenox  Disposition: Continue management with scheduled benzodiazepines in the setting of alcohol withdrawal syndrome and awaiting COVID testing.  Hopeful to avoid transitioning to ICU.  Will remain in progressive floor at this time.  Subjective:    Objective: Temp:  [98.4 F (36.9 C)-99.5 F (37.5 C)] 98.4 F (36.9 C) (04/03 0732) Pulse Rate:  [85-122] 103 (04/03 0732) Resp:  [17-41] 29 (04/03 0732) BP: (115-189)/(72-142) 156/72 (04/03 0732) SpO2:  [94 %-100 %] 96 % (04/03 0732) Weight:  [80.4 kg] 80.4 kg (04/03 0249) Physical Exam:   Laboratory: Recent Labs  Lab 07/28/18 1644  WBC 6.6  HGB 14.3  HCT 42.0  PLT PLATELET CLUMPS NOTED ON SMEAR, UNABLE TO ESTIMATE   Recent Labs  Lab 07/28/18 1644  NA 131*  K 3.6  CL 98  CO2 21*  BUN 10  CREATININE 0.81  CALCIUM 10.2  PROT 8.5*  BILITOT 4.6*  ALKPHOS 179*  ALT 34  AST 89*  GLUCOSE 132*   HIV antibody: Pending Lipid panel: LDL 162, cholesterol 224, TGY 98, HDL 42 Ethanol:  Negative RVP: Negative COVID-19: Pending  Imaging/Diagnostic Tests: EKG 12-LEAD (07/28/2018) Sinus tachycardia 103 bpm, LAFB, QTC 42, no ST changes or T wave abnormalities  PORTABLE CHEST 1 VIEW (07/28/2018) IMPRESSION: Cardiomegaly. No active disease. 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    Freistatt Bing, DO 07/29/2018, 8:31 AM PGY-3, [place] Intern pager: [phone], text pages welcome

## 2018-07-29 NOTE — ED Notes (Signed)
Page sent to infectious disease regarding the ordering of the Covid-19 r/o test.

## 2018-07-29 NOTE — Progress Notes (Signed)
  callf rom FPTS  - Patient with DT not responding well to ativan - Patient is rule out covid though not hypoxemic and no lymphopenia - makes covid unlikely in hospitalized patient  Plan  -r ecommend precedex gtt as next step -> if needed move to icu under pccm (given pandemic resource allocation - fpts to be primary)  - pccm to be called to bedside if intubation needed  - elink can support from camera     SIGNATURE    Dr. Kalman Shan, M.D., F.C.C.P,  Pulmonary and Critical Care Medicine Staff Physician, [facility] Director - Interstitial Lung Disease  Program  Pulmonary Fibrosis [facility] at [facility] [place], [place], [address]  Pager: [phone], If no answer or between  15:00h - 7:00h: call [phone] Telephone: [phone]  3:54 PM 07/29/2018

## 2018-07-29 NOTE — ED Notes (Signed)
Paged FM to Dr Eudelia Bunch

## 2018-07-30 DIAGNOSIS — I10 Essential (primary) hypertension: Secondary | ICD-10-CM

## 2018-07-30 DIAGNOSIS — F10239 Alcohol dependence with withdrawal, unspecified: Secondary | ICD-10-CM

## 2018-07-30 LAB — COMPREHENSIVE METABOLIC PANEL
ALT: 27 U/L (ref 0–44)
AST: 66 U/L — ABNORMAL HIGH (ref 15–41)
Albumin: 3.4 g/dL — ABNORMAL LOW (ref 3.5–5.0)
Alkaline Phosphatase: 132 U/L — ABNORMAL HIGH (ref 38–126)
Anion gap: 7 (ref 5–15)
BUN: 10 mg/dL (ref 6–20)
CO2: 20 mmol/L — ABNORMAL LOW (ref 22–32)
Calcium: 9.4 mg/dL (ref 8.9–10.3)
Chloride: 109 mmol/L (ref 98–111)
Creatinine, Ser: 0.78 mg/dL (ref 0.61–1.24)
GFR calc Af Amer: >60 mL/min (ref >60)
GFR calc non Af Amer: >60 mL/min (ref >60)
Glucose, Bld: 102 mg/dL — ABNORMAL HIGH (ref 70–99)
Potassium: 3.7 mmol/L (ref 3.5–5.1)
Sodium: 136 mmol/L (ref 135–145)
Total Bilirubin: 4.8 mg/dL — ABNORMAL HIGH (ref 0.3–1.2)
Total Protein: 7.3 g/dL (ref 6.5–8.1)

## 2018-07-30 LAB — CBC
HCT: 38.7 % — ABNORMAL LOW (ref 39.0–52.0)
Hemoglobin: 13.1 g/dL (ref 13.0–17.0)
MCH: 34.3 pg — ABNORMAL HIGH (ref 26.0–34.0)
MCHC: 33.9 g/dL (ref 30.0–36.0)
MCV: 101.3 fL — ABNORMAL HIGH (ref 80.0–100.0)
Platelets: 115 10*3/uL — ABNORMAL LOW (ref 150–400)
RBC: 3.82 MIL/uL — ABNORMAL LOW (ref 4.22–5.81)
RDW: 12.2 % (ref 11.5–15.5)
WBC: 5.6 10*3/uL (ref 4.0–10.5)
nRBC: 0 % (ref 0.0–0.2)

## 2018-07-30 LAB — HEPATITIS B CORE ANTIBODY, TOTAL: Hep B Core Total Ab: NEGATIVE

## 2018-07-30 LAB — HEPATITIS C ANTIBODY: HCV Ab: <0.1 s/co ratio (ref 0.0–0.9)

## 2018-07-30 LAB — HEPATITIS B SURFACE ANTIBODY, QUANTITATIVE: Hep B S AB Quant (Post): <3.1 m[IU]/mL — ABNORMAL LOW (ref >9.9)

## 2018-07-30 LAB — BILIRUBIN, FRACTIONATED(TOT/DIR/INDIR)
Bilirubin, Direct: 1.6 mg/dL — ABNORMAL HIGH (ref 0.0–0.2)
Indirect Bilirubin: 2.9 mg/dL — ABNORMAL HIGH (ref 0.3–0.9)
Total Bilirubin: 4.5 mg/dL — ABNORMAL HIGH (ref 0.3–1.2)

## 2018-07-30 MED ORDER — ADULT MULTIVITAMIN W/MINERALS CH
1.0000 | ORAL_TABLET | Freq: Every day | ORAL | Status: DC
  Administered 2018-07-30 – 2018-08-06 (×8): 1 via ORAL
  Filled 2018-07-30 (×8): qty 1

## 2018-07-30 MED ORDER — CHLORDIAZEPOXIDE HCL 25 MG PO CAPS
100.0000 mg | ORAL_CAPSULE | Freq: Three times a day (TID) | ORAL | Status: DC
  Administered 2018-07-30 – 2018-07-31 (×3): 100 mg via ORAL
  Filled 2018-07-30 (×3): qty 4

## 2018-07-30 MED ORDER — ENSURE ENLIVE PO LIQD
237.0000 mL | Freq: Two times a day (BID) | ORAL | Status: DC
  Administered 2018-07-30 – 2018-08-06 (×10): 237 mL via ORAL

## 2018-07-30 MED ORDER — LISINOPRIL 10 MG PO TABS
10.0000 mg | ORAL_TABLET | Freq: Every day | ORAL | Status: DC
  Administered 2018-07-30: 10 mg via ORAL
  Filled 2018-07-30: qty 1

## 2018-07-30 NOTE — Progress Notes (Signed)
Family Medicine Teaching Service Daily Progress Note Intern Pager: [phone]  Patient name: Leroy Bell [facility] Medical record number: [id number] Date of birth: 1960/11/26 Age: 58 y.o. Gender: male  Primary Care Provider: Patient, No Pcp Per Consultants: CCM aware Code Status: Full  Pt Overview and Major Events to Date:  Leroy Bell is a 58 y.o. male presenting with confusion, visual hallucinations, tremors, cough and fevers at home in the setting of long history of alcohol abuse concerning for acute alcohol withdrawal. Patient has also reported cough, and fevers at home with self quarantined for the past few days suspicious for possible covid-19. PMH is significant for alcohol use disorder.  07/28/2018 - Admission    Assessment and Plan:  Acute alcohol withdrawal- H/o alcohol abuse.CIWAs 29 >> 2. Markedly improved on librium + CIWA protocol. Likely withdrawing in setting of acute viral illness and in ability to tolerate PO alcohol. SW reports that wife says patient quit 1 week ago due to depression and suicidal ideation. Will assess mood. May be able to discharge later today if remains stable and continues to tolerate PO.  - titrate up Librium 100 mg TID - CIWA protocol w/ ativan prn - folate and thiamine - tylenol PRN - continuous cardiac monitoring - SW consult - consider SSRI if depressed  URI PUI COVID19 RVP negative. Coronavirus pending. Afebrile. SORA. Likely able to D/C with instructions for home isolation for 14 days since onset of illness.  - Novel covid testing - Airborne and droplet precautions  Mild Transaminitis Patient with significant h/o alcohol use disorder. Also possibly elevated in setting of viral illness. AST 75 > 66. ALT wnl. Tbili 5.5 > 4.8. Hep C neg. Heb B shows not immunized. Will reassess vaccine status. - Recommend hepatitis B vaccination booster in outpatient setting.  - follow LFTs, fractionated bilirubin pending - consider outpatient GI  c/s  #Hyponatremia, resolved Na 131 >> 136  HTN- multiple reads > 140/90. May be slightly higher in setting of acute viral illness and alcohol withdrawal.  - monitor BP  FEN/GI: Normal diet Prophylaxis: lovenox  Disposition: inpatient in setting of alcohol withdrawal  Subjective:  See separate attestation for S&O.   Objective: Temp:  [97.9 F (36.6 C)-98.7 F (37.1 C)] 97.9 F (36.6 C) (04/04 0427) Pulse Rate:  [77-111] 77 (04/04 0734) Resp:  [25-34] 26 (04/04 0734) BP: (141-158)/(66-91) 144/66 (04/04 0734) SpO2:  [92 %-99 %] 96 % (04/04 0734) Physical Exam:   Laboratory: Recent Labs  Lab 07/28/18 1644 07/29/18 0810 07/30/18 0419  WBC 6.6 5.5 5.6  HGB 14.3 12.9* 13.1  HCT 42.0 38.1* 38.7*  PLT PLATELET CLUMPS NOTED ON SMEAR, UNABLE TO ESTIMATE 110* 115*   Recent Labs  Lab 07/28/18 1644 07/29/18 0810 07/30/18 0419  NA 131* 136 136  K 3.6 3.8 3.7  CL 98 107 109  CO2 21* 20* 20*  BUN 10 10 10   CREATININE 0.81 0.83 0.78  CALCIUM 10.2 9.7 9.4  PROT 8.5* 7.3 7.3  BILITOT 4.6* 5.5* 4.8*  ALKPHOS 179* 146* 132*  ALT 34 30 27  AST 89* 75* 66*  GLUCOSE 132* 112* 102*      Imaging/Diagnostic Tests: Dg Chest Portable 1 View  Result Date: 07/28/2018 CLINICAL DATA:  Cough, chills, fever, and runny nose for 1 week. Hypertension. EXAM: PORTABLE CHEST 1 VIEW COMPARISON:  None. FINDINGS: Elevated LEFT hemidiaphragm, likely chronic. Cardiomegaly. No consolidation or edema. No effusion pneumothorax. Bones unremarkable. IMPRESSION: Cardiomegaly. No active disease. 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 Electronically  Signed   By: Elsie Stain M.D.   On: 07/28/2018 17:52    Garnette Gunner, MD 07/30/2018, 8:47 AM PGY-2, [place] [facility] FPTS Intern pager: [phone], text pages welcome

## 2018-07-30 NOTE — TOC Initial Note (Signed)
Transition of Care [facility]) - Initial/Assessment Note    Patient Details  Name: Leroy Bell MRN: [id number] Date of Birth: [birth year]  Transition of Care [facility]) CM/SW Contact:    Gwenlyn Fudge, LCSWA Phone Number: 07/30/2018, 11:04 AM  Clinical Narrative:    CSW called and spoke with patient wife since the patient is only orientated x2. Patient's wife stated that her husband was an alcoholic. She stated that he would drink beer. She was unable to relay about how many drinks he would have on a daily basis. She stated that her husband was a high functioning alcoholic. She stated that her husband suffers from depression. She stated that he currently does not receive any type of care for his depression. She stated that her husband felt suicidial last weekend and that his hallucinations had started. She is fearful of what will happen if her husband were to come home and still have hallucinations. She would like a psych consultation for her husband.   CSW provided substance abuse resources via e-mail. CSW called and spoke with teaching service about the fears of the patient's wife.                  Expected Discharge Plan: (Home) Barriers to Discharge: Continued Medical Work up   Patient Goals and CMS Choice     Choice offered to / list presented to : NA  Expected Discharge Plan and Services Expected Discharge Plan: (Home) In-house Referral: Clinical Social Work Discharge Planning Services: Other - See comment(Provided substance resources to patient's wife) Post Acute Care Choice: NA                   DME Arranged: N/A DME Agency: NA HH Arranged: NA HH Agency: NA  Prior Living Arrangements/Services   Lives with:: Self, Spouse, Minor Children Patient language and need for interpreter reviewed:: No Do you feel safe going back to the place where you live?: Yes      Need for Family Participation in Patient Care: Yes (Comment)(Pt orient x2)     Criminal Activity/Legal Involvement  Pertinent to Current Situation/Hospitalization: No - Comment as needed  Activities of Daily Living Home Assistive Devices/Equipment: CPAP ADL Screening (condition at time of admission) Patient's cognitive ability adequate to safely complete daily activities?: Yes Is the patient deaf or have difficulty hearing?: No Does the patient have difficulty seeing, even when wearing glasses/contacts?: No Does the patient have difficulty concentrating, remembering, or making decisions?: Yes Patient able to express need for assistance with ADLs?: No Does the patient have difficulty dressing or bathing?: No Independently performs ADLs?: Yes (appropriate for developmental age) Does the patient have difficulty walking or climbing stairs?: No Weakness of Legs: Both Weakness of Arms/Hands: Right(R hand chronic tremors)  Permission Sought/Granted Permission sought to share information with : Case Manager Permission granted to share information with : Yes, Verbal Permission Granted  Share Information with NAME: Family           Emotional Assessment Appearance:: Appears stated age Attitude/Demeanor/Rapport: Unable to Assess Affect (typically observed): Unable to Assess Orientation: : Oriented to Self, Oriented to Place Alcohol / Substance Use: Alcohol Use Psych Involvement: Yes (comment)(Wife expressed concern for wanting a psych consult)  Admission diagnosis:  Viral URI with cough [J06.9, B97.89] Alcohol withdrawal syndrome with perceptual disturbance ([name]) [F10.232] Alcohol withdrawal ([name]) [F10.239] Patient Active Problem List   Diagnosis Date Noted  . Essential hypertension   . Viral URI with cough   . Sinus tachycardia   .  Elevated blood-pressure reading without diagnosis of hypertension   . Alcohol withdrawal ([name]) 07/28/2018   PCP:  Patient, No Pcp Per Pharmacy:   [id number] [place], [place] - [address] [place] [address] Phone:  [phone] Fax: [phone]     Social Determinants of Health (SDOH) Interventions    Readmission Risk Interventions Readmission Risk Prevention Plan 07/30/2018  Post Dischage Appt Complete  Medication Screening Complete  Transportation Screening Complete  Some recent data might be hidden

## 2018-07-30 NOTE — Progress Notes (Signed)
Initial Nutrition Assessment  DOCUMENTATION CODES:  Not applicable  INTERVENTION:  MVI with minerals  Already ordered thiamin/folate  Ensure Enlive po BID, each supplement provides 350 kcal and 20 grams of protein  NUTRITION DIAGNOSIS:  Poor nutrition quality of life related to chronic illness(alcoholism) as evidenced by Reportedly drinking 6-12 beers/day (as much as 17) at [place].  GOAL:  Patient will meet greater than or equal to 90% of their needs  MONITOR:  PO intake, Supplement acceptance, Weight trends, I & O's, Labs  REASON FOR ASSESSMENT:  Consult Assessment of nutrition requirement/status  ASSESSMENT:  58 y/o male w/ PMHx htn, etoh abuse. Brought by EMS d/t having hallucinations. Had acutely stopped drinking 5 days ago. Pt reported fever and dry cough. Truck driver for living. Admitted for management of etoh withdrawal and covid rule out  RD operating remotely d/t COVID precautions. Pt is actively withdrawing and is reportedly confused. All info obtained from chart. There is no information prior to this encounter available in chart.  Per chart the patient is a daily drinker- drinks 6-12 beers/day, though has been as much as 17. He apparently stopped cold [place] a little less than a week ago, though hallucinations have only been occurring x3 days- suspect poor intake during this time.    He has also developed cough and fever, concerning for coronavirus. He had denied n/v/d   No documented po intake at this time. Will add mvi and ensure supplements given his poor nutritional baseline.   Labs: Na: 131->136, Elevate alk phos, bili. A1C: 5.7, Albumin:3.4,  Meds thiamin, folate  Recent Labs  Lab 07/28/18 1644 07/29/18 0810 07/30/18 0419  NA 131* 136 136  K 3.6 3.8 3.7  CL 98 107 109  CO2 21* 20* 20*  BUN _0 CREATININE 0.81 0.83 0.78  CALCIUM 10.2 9.7 9.4  GLUCOSE 132* 112* 102*    NUTRITION - FOCUSED PHYSICAL EXAM: Unable to conduct   Diet Order:    Diet Order            Diet regular Room service appropriate? Yes; Fluid consistency: Thin  Diet effective now             EDUCATION NEEDS:  No education needs have been identified at this time  Skin:  Skin Assessment: Reviewed RN Assessment  Last BM:  Unknown  Height:  Ht Readings from Last 1 Encounters:  07/29/18 _1  (1.753 m)   Weight:  Wt Readings from Last 1 Encounters:  07/29/18 80.4 kg   Ideal Body Weight:  72.73 kg  BMI:  Body mass index is 26.18 kg/m.  Estimated Nutritional Needs:  Kcal:  1850-2000 kcals (23-25 kcals/d) Protein:  80-95g (1-1.2g/kg bw) Fluid:  1.8-2L fluid  Burtis Junes RD, LDN, CNSC Clinical Nutrition Available Tues-Sat via Pager: [phone] 07/30/2018 4:53 PM

## 2018-07-30 NOTE — Progress Notes (Signed)
Called patient's wife. Informed her that we will be keeping Leroy Bell is the hospital for at least 1-2 more days due to ongoing Alcohol Withdrawal. Wife appreciative of call.

## 2018-07-31 NOTE — Progress Notes (Signed)
Family Medicine Teaching Service Daily Progress Note Intern Pager: [phone]  Patient name: Leroy Bell [facility] Medical record number: [id number] Date of birth: 08/06/1960 Age: 58 y.o. Gender: male  Primary Care Provider: Patient, No Pcp Per Consultants: SW, dietitian  Code Status: Full Code   Pt Overview and Major Events to Date:  Admitted: 07/28/2018 for CC: Hallucinations and Alcohol Problem  Hospital Day: 4   Assessment and Plan: Leroy Bell is a 58 y.o. male admitted for confusion, visual hallucinations, tremor and diagnosed with delirium tremens.  Patient also noted subjective fevers and dry cough. His chronic conditions include alcohol use disorder  #Acute alcohol withdrawal CIWA is 6 this morning for mild tremor, paroxysmal sweats, anxiety, 11 at midnight.  Ranging from 4-11 in the last 24 hours.  This is a great improvement from 39 on admission.  Today with Loraine Leriche day 4 of acute alcohol withdrawal.  Currently on scheduled Librium 100 mg 3 times daily. Patient is very sedated on exam today.   Switch to PRN ativan   Continue thiamine , continue MVI, continue folic acid  CM and SW consulted   # Subjective cough and fever  VSS  COVID panel pending   Contact Droplet precautions   # Mild transaminitis, improving  AST downtrending. Fractionated bilirubin re  Check AM CMP   Recs for hep B booster after discharge   #Hyperbilrubinemia, indirect and direct  Both values are elevated.   Will need outpatient follow up   # HTN  Monitor in setting of ETOH withdrawal   #Hyperlipidemia  Total cholesterol and LDL elevated. Patient would benefit from statin  #FEN/GI:  . Fluids: KVO  . Electrolytes: wnl  . Nutrition: regular diet    VTE prophylaxis: Lovenox Q24H  Disposition: Home pending withdrawal improvement.     Subjective:  NAEO.   Objective: BP 134/64 (BP Location: Left Arm)   Pulse 73   Temp 98.1 F (36.7 C) (Oral)   Resp 19   Ht 5\' 9"  (1.753 m)   Wt  80.4 kg   SpO2 100%   BMI 26.18 kg/m  Intake/Output      04/04 0701 - 04/05 0700 04/05 0701 - 04/06 0700   Urine (mL/kg/hr)     Total Output     Net           Physical Exam: See attending attestation   Laboratory: I have personally read and reviewed all labs and imaging studies.  CBC: Recent Labs  Lab 07/28/18 1644 07/29/18 0810 07/30/18 0419  WBC 6.6 5.5 5.6  NEUTROABS 4.9  --   --   HGB 14.3 12.9* 13.1  HCT 42.0 38.1* 38.7*  MCV 99.8 100.0 101.3*  PLT PLATELET CLUMPS NOTED ON SMEAR, UNABLE TO ESTIMATE 110* 115*   Basic Metabolic Panel: Recent Labs  Lab 07/28/18 1644 07/29/18 0810 07/30/18 0419  NA 131* 136 136  K 3.6 3.8 3.7  CL 98 107 109  CO2 21* 20* 20*  GLUCOSE 132* 112* 102*  BUN 10 10 10   CREATININE 0.81 0.83 0.78  CALCIUM 10.2 9.7 9.4   GFR: Estimated Creatinine Clearance: 104.3 mL/min (by C-G formula based on SCr of 0.78 mg/dL). Liver Function Tests: Recent Labs  Lab 07/28/18 1644 07/29/18 0810 07/30/18 0419 07/30/18 1117  AST 89* 75* 66*  --   ALT 34 30 27  --   ALKPHOS 179* 146* 132*  --   BILITOT 4.6* 5.5* 4.8* 4.5*  PROT 8.5* 7.3 7.3  --  ALBUMIN 3.9 3.5 3.4*  --   HbA1C: Recent Labs    07/29/18 0810  HGBA1C 5.7*   Lipid Profile: Recent Labs    07/28/18 2235  CHOL 224*  HDL 42  LDLCALC 162*  TRIG 98  CHOLHDL 5.3   RVP negative    Melene Plan, MD 07/31/2018, 9:47 AM PGY-1, [facility] Family Medicine FPTS Intern pager: [phone], text pages welcome

## 2018-07-31 NOTE — Progress Notes (Signed)
Family Medicine Teaching Service Attending Note  I interviewed and examined patient Leroy Bell and reviewed their tests and x-rays.  I discussed with Dr. Selena Batten and reviewed their note for today.  I agree with their assessment and plan.     Additionally  Very sedated this am.  Denies pain or shortness of breath  O Sleeping soundly Awakens to loud voice and gentle shaking Oriented to city and state not place or year Abdomen: soft and non-tender without masses, organomegaly or hernias noted.  No guarding or rebound   Withdrawal - stop scheduled librium and follow with as needed ativan given his current level of sedation and that he is 4 days+ since last drink  Once more awake need to assess for depression and abstinance

## 2018-08-01 ENCOUNTER — Inpatient Hospital Stay (HOSPITAL_COMMUNITY): Payer: Managed Care, Other (non HMO)

## 2018-08-01 LAB — NOVEL CORONAVIRUS, NAA (HOSP ORDER, SEND-OUT TO REF LAB; TAT 18-24 HRS): SARS-CoV-2, NAA: NOT DETECTED

## 2018-08-01 LAB — COMPREHENSIVE METABOLIC PANEL
ALT: 29 U/L (ref 0–44)
AST: 59 U/L — ABNORMAL HIGH (ref 15–41)
Albumin: 3.4 g/dL — ABNORMAL LOW (ref 3.5–5.0)
Alkaline Phosphatase: 138 U/L — ABNORMAL HIGH (ref 38–126)
Anion gap: 8 (ref 5–15)
BUN: 10 mg/dL (ref 6–20)
CO2: 24 mmol/L (ref 22–32)
Calcium: 9.6 mg/dL (ref 8.9–10.3)
Chloride: 105 mmol/L (ref 98–111)
Creatinine, Ser: 0.84 mg/dL (ref 0.61–1.24)
GFR calc Af Amer: >60 mL/min (ref >60)
GFR calc non Af Amer: >60 mL/min (ref >60)
Glucose, Bld: 95 mg/dL (ref 70–99)
Potassium: 3.9 mmol/L (ref 3.5–5.1)
Sodium: 137 mmol/L (ref 135–145)
Total Bilirubin: 3.3 mg/dL — ABNORMAL HIGH (ref 0.3–1.2)
Total Protein: 7.2 g/dL (ref 6.5–8.1)

## 2018-08-01 MED ORDER — PNEUMOCOCCAL VAC POLYVALENT 25 MCG/0.5ML IJ INJ
0.5000 mL | INJECTION | INTRAMUSCULAR | Status: AC
  Administered 2018-08-06: 17:00:00 0.5 mL via INTRAMUSCULAR
  Filled 2018-08-01 (×2): qty 0.5

## 2018-08-01 MED ORDER — INFLUENZA VAC SPLIT QUAD 0.5 ML IM SUSY
0.5000 mL | PREFILLED_SYRINGE | INTRAMUSCULAR | Status: AC
  Administered 2018-08-06: 0.5 mL via INTRAMUSCULAR
  Filled 2018-08-01: qty 0.5

## 2018-08-01 NOTE — Progress Notes (Addendum)
Family Medicine Teaching Service Daily Progress Note Intern Pager: [phone]  Patient name: Leroy Bell Medical record number: [id number] Date of birth: [birth year] Age: 58 y.o. Gender: male  Primary Care Provider: Patient, No Pcp Per Consultants: SW, dietitian  Code Status: Full Code   Pt Overview and Major Events to Date:  Admitted: 07/28/2018 for CC: Hallucinations and Alcohol Problem  Hospital Day: 5   Assessment and Plan: Leroy Bell is a 58 y.o. male admitted for confusion, visual hallucinations, tremor and diagnosed with delirium tremens.  Patient also noted subjective fevers and dry cough. His chronic conditions include alcohol use disorder  #Acute alcohol withdrawal CIWA 2 this AM. Range from 2-10 past 24 hr. 5 days since last drink. Librium held yesterday due to oversedation. Still sedated in AM. Pt got 8 mg ativan total past 24 hrs. A&O to person and place and year this AM.   CIWA protocol,  discontinue PRN ativan  Monitor mental status, if continues to be somnolent, will get ammonia and CT Head   Continue thiamine , continue MVI, continue folic acid  CM and SW consulted   Consult wife for dispo  # Subjective cough and fever  PUI COVID19 VSS. Afebrile. RVP neg. SARs-Cov-2 neg  Transfer off of 2W  Stop Contact Droplet precautions   # Mild transaminitis, improving # Hyperbilrubinemia, indirect and direct   AST downtrending. Tbili downtreding. Indirect > direct, but both elevated on 4/5. No abdominal pain. Possible Gilbert syndrome.    Trend CMP  Recs for hep B booster after discharge   Will need outpatient GI follow up  RUQ evaluate biliary tract  # HTN  Monitor in setting of ETOH withdrawal   #Hyperlipidemia   Total cholesterol and LDL elevated. Patient would benefit from statin once LFTs normalize  #FEN/GI:  . Fluids: KVO  . Electrolytes: wnl  . Nutrition: regular diet    VTE prophylaxis: Lovenox Q24H  Disposition: Home pending AMS  improvement.     Subjective:  No acute events overnight. Patient sounds somnolent on phone. A&Ox2.   Objective: BP 122/69 (BP Location: Left Arm)   Pulse 67   Temp 98.5 F (36.9 C) (Oral)   Resp 20   Ht 5\' 9"  (1.753 m)   Wt 80.4 kg   SpO2 96%   BMI 26.18 kg/m  Intake/Output      04/05 0701 - 04/06 0700 04/06 0701 - 04/07 0700   Urine (mL/kg/hr) 1100 (0.6)    Total Output 1100    Net -1100          Physical Exam: See attending attestation   Laboratory: I have personally read and reviewed all labs and imaging studies.  CBC: Recent Labs  Lab 07/28/18 1644 07/29/18 0810 07/30/18 0419  WBC 6.6 5.5 5.6  NEUTROABS 4.9  --   --   HGB 14.3 12.9* 13.1  HCT 42.0 38.1* 38.7*  MCV 99.8 100.0 101.3*  PLT PLATELET CLUMPS NOTED ON SMEAR, UNABLE TO ESTIMATE 110* 115*   Basic Metabolic Panel: Recent Labs  Lab 07/28/18 1644 07/29/18 0810 07/30/18 0419 08/01/18 0725  NA 131* 136 136 137  K 3.6 3.8 3.7 3.9  CL 98 107 109 105  CO2 21* 20* 20* 24  GLUCOSE 132* 112* 102* 95  BUN 10 10 10 10   CREATININE 0.81 0.83 0.78 0.84  CALCIUM 10.2 9.7 9.4 9.6   GFR: Estimated Creatinine Clearance: 99.4 mL/min (by C-G formula based on SCr of 0.84 mg/dL). Liver Function Tests: Recent  Labs  Lab 07/28/18 1644 07/29/18 0810 07/30/18 0419 07/30/18 1117 08/01/18 0725  AST 89* 75* 66*  --  59*  ALT 34 30 27  --  29  ALKPHOS 179* 146* 132*  --  138*  BILITOT 4.6* 5.5* 4.8* 4.5* 3.3*  PROT 8.5* 7.3 7.3  --  7.2  ALBUMIN 3.9 3.5 3.4*  --  3.4*  HbA1C: No results for input(s): HGBA1C in the last 72 hours. Lipid Profile: No results for input(s): CHOL, HDL, LDLCALC, TRIG, CHOLHDL, LDLDIRECT in the last 72 hours. RVP negative    Garnette Gunner, MD 08/01/2018, 8:32 AM PGY-2, [place] Family Medicine FPTS Intern pager: [phone], text pages welcome

## 2018-08-02 ENCOUNTER — Inpatient Hospital Stay (HOSPITAL_COMMUNITY): Payer: Managed Care, Other (non HMO)

## 2018-08-02 DIAGNOSIS — R17 Unspecified jaundice: Secondary | ICD-10-CM

## 2018-08-02 LAB — BLOOD GAS, ARTERIAL
Acid-base deficit: 0.3 mmol/L (ref 0.0–2.0)
Bicarbonate: 23.7 mmol/L (ref 20.0–28.0)
Drawn by: 331761
FIO2: 21
O2 Saturation: 94.7 %
Patient temperature: 98
pCO2 arterial: 37.4 mmHg (ref 32.0–48.0)
pH, Arterial: 7.417 (ref 7.350–7.450)
pO2, Arterial: 72.2 mmHg — ABNORMAL LOW (ref 83.0–108.0)

## 2018-08-02 LAB — AMMONIA: Ammonia: 27 umol/L (ref 9–35)

## 2018-08-02 LAB — VITAMIN B12: Vitamin B-12: 1757 pg/mL — ABNORMAL HIGH (ref 180–914)

## 2018-08-02 LAB — HEPATIC FUNCTION PANEL
ALT: 31 U/L (ref 0–44)
AST: 61 U/L — ABNORMAL HIGH (ref 15–41)
Albumin: 3.5 g/dL (ref 3.5–5.0)
Alkaline Phosphatase: 128 U/L — ABNORMAL HIGH (ref 38–126)
Bilirubin, Direct: 1.2 mg/dL — ABNORMAL HIGH (ref 0.0–0.2)
Indirect Bilirubin: 2.3 mg/dL — ABNORMAL HIGH (ref 0.3–0.9)
Total Bilirubin: 3.5 mg/dL — ABNORMAL HIGH (ref 0.3–1.2)
Total Protein: 8.2 g/dL — ABNORMAL HIGH (ref 6.5–8.1)

## 2018-08-02 LAB — FOLATE: Folate: 36.5 ng/mL (ref >5.9)

## 2018-08-02 LAB — TSH: TSH: 2.903 u[IU]/mL (ref 0.350–4.500)

## 2018-08-02 LAB — GLUCOSE, CAPILLARY: Glucose-Capillary: 84 mg/dL (ref 70–99)

## 2018-08-02 NOTE — Progress Notes (Addendum)
Family Medicine Teaching Service Daily Progress Note Intern Pager: [phone]  Patient name: [facility] Medical record number: [id number] Date of birth: 1961/02/03 Age: 58 y.o. Gender: male  Primary Care Provider: Patient, No Pcp Per Consultants: SW, dietitian  Code Status: Full Code   Pt Overview and Major Events to Date:  Admitted: 07/28/2018 for CC: Hallucinations and Alcohol Problem  Hospital Day: 6   Assessment and Plan: Leroy BarreWilliam T Awbrey is a 58 y.o. male admitted for confusion, visual hallucinations, tremor and diagnosed with delirium tremens.  Patient also noted subjective fevers and dry cough. His chronic conditions include alcohol use disorder  #Acute alcohol withdrawal CIWA 2 this AM. Range from 2-4 past 24 hr. 5 days since last drink. Patient CIWA stable, but and still endorses sleepiness this AM. Has not had ativan in > 24 hrs. Pt has not had CPAP in several days, patient is possibly retaining CO2, although satting well on RA. Ammonia, ABG  CIWA protocol.   If negative, recheck AMS this PM. If still altered, get head CT.   Call wife and get mental status baseline.   Continue thiamine , continue MVI, continue folic acid  # Subjective cough and fever  PUI COVID19 VSS. Afebrile. RVP neg. SARs-Cov-2 neg  # Mild transaminitis, improving # Hyperbilrubinemia, indirect and direct   AST and Tbili stable. Elevated transaminases likely due to alcohol use. Hepatitis B surface antigen negative. Hepatitis C Ab neg. RUQ US showed hepatic steatosis. No bililary disease. Possible Gilbert syndrome leading to elevated bilirubin  Recs for hep B booster after discharge   Will need outpatient GI follow up  # HTN  Normotensive  #Hyperlipidemia   Total cholesterol and LDL elevated. Patient would benefit from statin once LFTs normalize  #FEN/GI:  . Fluids: KVO  . Electrolytes: wnl  . Nutrition: regular diet    VTE prophylaxis: Lovenox Q24H  Disposition: Home pending AMS  improvement.     Subjective:  Patient feels very tired. He says that he has been able to sleep better with his CPAP. He says he could sleep for 24 hrs.   Objective: BP 124/74 (BP Location: Left Arm)   Pulse 65   Temp 98 F (36.7 C)   Resp 18   Ht 5\' 9"  (1.753 m)   Wt 80.4 kg   SpO2 98%   BMI 26.18 kg/m  Intake/Output      04/06 0701 - 04/07 0700 04/07 0701 - 04/08 0700   Urine (mL/kg/hr) 1400 (0.7)    Total Output 1400    Net -1400          Physical Exam: Gen: NAD, resting comfortably, CPAP in place CV: RRR with no murmurs appreciated Pulm: NWOB, CTAB with no crackles, wheezes, or rhonchi GI: Normal bowel sounds present. Soft, Nontender, Nondistended. MSK: no edema, cyanosis, or clubbing noted Skin: warm, dry Neuro: A&Ox3, moves all extremities spontaneously   Laboratory: I have personally read and reviewed all labs and imaging studies.  CBC: Recent Labs  Lab 07/28/18 1644 07/29/18 0810 07/30/18 0419  WBC 6.6 5.5 5.6  NEUTROABS 4.9  --   --   HGB 14.3 12.9* 13.1  HCT 42.0 38.1* 38.7*  MCV 99.8 100.0 101.3*  PLT PLATELET CLUMPS NOTED ON SMEAR, UNABLE TO ESTIMATE 110* 115*   Basic Metabolic Panel: Recent Labs  Lab 07/28/18 1644 07/29/18 0810 07/30/18 0419 08/01/18 0725  NA 131* 136 136 137  K 3.6 3.8 3.7 3.9  CL 98 107 109 105  CO2  21* 20* 20* 24  GLUCOSE 132* 112* 102* 95  BUN 10 10 10 10   CREATININE 0.81 0.83 0.78 0.84  CALCIUM 10.2 9.7 9.4 9.6   GFR: Estimated Creatinine Clearance: 99.4 mL/min (by C-G formula based on SCr of 0.84 mg/dL). Liver Function Tests: Recent Labs  Lab 07/28/18 1644 07/29/18 0810 07/30/18 0419 07/30/18 1117 08/01/18 0725  AST 89* 75* 66*  --  59*  ALT 34 30 27  --  29  ALKPHOS 179* 146* 132*  --  138*  BILITOT 4.6* 5.5* 4.8* 4.5* 3.3*  PROT 8.5* 7.3 7.3  --  7.2  ALBUMIN 3.9 3.5 3.4*  --  3.4*  Garnette Gunner, MD 08/02/2018, 9:11 AM PGY-2, [place] Family Medicine FPTS Intern pager: [phone], text pages  welcome

## 2018-08-02 NOTE — Evaluation (Signed)
Physical Therapy Evaluation Patient Details Name: Leroy Bell MRN: [id number] DOB: [birth year] Today's Date: 08/02/2018   History of Present Illness  Pt is a 58 y/o male admitted for confusion, visual hallucinations, tremor and diagnosed with delirium.  Patient also noted subjective fevers and dry cough. Covid-19 negative. Pt admitted for ETOH withdrawal.   Clinical Impression  Pt admitted with above diagnosis. Pt currently with functional limitations due to the deficits listed below (see PT Problem List). At the time of PT eval pt was able to perform transfers and ambulation with up to mod assist for balance support, walker management and safety. Noted slow processing, confusion, decreased awareness, decreased short term memory, and need for multimodal cues at times. Pt will benefit from skilled PT to increase their independence and safety with mobility to allow discharge to the venue listed below.       Follow Up Recommendations Home health PT;Supervision for mobility/OOB    Equipment Recommendations  Rolling walker with 5" wheels;3in1 (PT)    Recommendations for Other Services       Precautions / Restrictions Precautions Precautions: Fall Restrictions Weight Bearing Restrictions: No      Mobility  Bed Mobility Overal bed mobility: Needs Assistance Bed Mobility: Supine to Sit     Supine to sit: Min guard     General bed mobility comments: Increased time and frequent cues to get pt to fully transition to EOB with feet on floor.   Transfers Overall transfer level: Needs assistance Equipment used: Rolling walker (2 wheeled) Transfers: Sit to/from Stand Sit to Stand: Mod assist         General transfer comment: Assist to power-up to full standing position. Pt required increased time to gain/maintain balance. Pt required multimodal cues to reach back for chair prior to initiating stand>sit.   Ambulation/Gait Ambulation/Gait assistance: Min assist Gait Distance  (Feet): 25 Feet Assistive device: Rolling walker (2 wheeled) Gait Pattern/deviations: Decreased stride length;Shuffle;Trunk flexed;Narrow base of support Gait velocity: Decreased Gait velocity interpretation: <1.8 ft/sec, indicate of risk for recurrent falls General Gait Details: Drifting to the L and occasionally requiring assist for pt to avoid obstacles on the L side with the walker. Min assist for balance and walker management.   Stairs            Wheelchair Mobility    Modified Rankin (Stroke Patients Only)       Balance Overall balance assessment: Needs assistance Sitting-balance support: Feet supported;No upper extremity supported Sitting balance-[name]: Fair     Standing balance support: Bilateral upper extremity supported;During functional activity Standing balance-[name]: Poor Standing balance comment: Reliant on RW for support at this time.                              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   Dominant Hand: Right    Extremity/Trunk Assessment   Upper Extremity Assessment Upper Extremity Assessment: Defer to OT  evaluation    Lower Extremity Assessment Lower Extremity Assessment: (Strength is good but pt uncoordinated and slow moving)    Cervical / Trunk Assessment Cervical / Trunk Assessment: Normal;Other exceptions Cervical / Trunk Exceptions: Forward head posture  with rounded shoulders  Communication   Communication: No difficulties  Cognition Arousal/Alertness: Lethargic Behavior During Therapy: Flat affect Overall Cognitive Status: Impaired/Different from baseline Area of Impairment: Orientation;Memory;Following commands;Safety/judgement;Attention;Awareness;Problem solving                 Orientation Level: Disoriented to;Time;Situation Current Attention Level: Sustained Memory: Decreased short-term memory Following Commands: Follows one step commands consistently;Follows one step commands with increased time;Follows multi-step commands inconsistently Safety/Judgement: Decreased awareness of safety;Decreased awareness of deficits Awareness: Intellectual Problem Solving: Slow processing;Decreased initiation;Difficulty sequencing;Requires verbal cues        General Comments      Exercises     Assessment/Plan    PT Assessment Patient needs continued PT services  PT Problem List Decreased strength;Decreased activity tolerance;Decreased balance;Decreased mobility;Decreased knowledge of use of DME;Decreased cognition;Decreased safety awareness;Decreased knowledge of precautions       PT Treatment Interventions DME instruction;Gait training;Functional mobility training;Stair training;Therapeutic activities;Therapeutic exercise;Neuromuscular re-education;Patient/family education    PT Goals (Current goals can be found in the Care Plan section)  Acute Rehab PT Goals Patient Stated Goal: Pt did not state goals - perseverating on finding his wallet (in personal belongings bag) PT Goal Formulation: With patient Time For Goal Achievement: 08/09/18 Potential to Achieve Goals: Good    Frequency Min 3X/week   Barriers to discharge        Co-evaluation               AM-PAC PT "6 Clicks" Mobility  Outcome Measure Help needed turning from your back to your side while in a flat bed without using bedrails?: None Help needed moving  from lying on your back to sitting on the side of a flat bed without using bedrails?: A Little Help needed moving to and from a bed to a chair (including a wheelchair)?: A Little Help needed standing up from a chair using your arms (e.g., wheelchair or bedside chair)?: A Little Help needed to walk in hospital room?: A Little Help needed climbing 3-5 steps with a railing? : A Lot 6 Click Score: 18    End of Session Equipment Utilized During Treatment: Gait belt Activity Tolerance: Patient tolerated treatment well Patient left: in chair;with call bell/phone within reach;with nursing/sitter in room Nurse Communication: Mobility status PT Visit Diagnosis: Unsteadiness on feet (R26.81);Difficulty in walking, not elsewhere classified (R26.2)    Time: 6045-40981215-1240 PT Time Calculation (min) (ACUTE ONLY): 25 min   Charges:   PT Evaluation $PT Eval Moderate Complexity: 1 Mod PT Treatments $Gait Training: 8-22 mins        Conni SlipperLaura Damian Buckles, PT, DPT Acute Rehabilitation Services Pager: [phone] Office: [phone]   Marylynn PearsonLaura D Antonyo Hinderer 08/02/2018, 1:50 PM

## 2018-08-02 NOTE — Evaluation (Signed)
Occupational Therapy Evaluation Patient Details Name: Leroy Bell MRN: [id number] DOB: 1961/02/22 Today's Date: 08/02/2018    History of Present Illness Pt is a 58 y/o male admitted for confusion, visual hallucinations, tremor and diagnosed with delirium.  Patient also noted subjective fevers and dry cough. Covid-19 negative. Pt admitted for ETOH withdrawal.    Clinical Impression   PTA patient independent and working.  Admitted for above and limited by impaired cognition, decreased balance, generalized weakness, safety.  Patient able to complete basic transfers with min assist, functional in room mobility with min-mod assist with walker mgmt and safety cueing, UB ADls with min assist and LB ADLs with mod assist.  Limited eval due to transport arriving, but recommend further cognitive assessment.  Pt with slow processing, difficulty problem solving functional tasks.  Patient will benefit from continued OT services while admitted and after dc at [facility] level in order to optimize independence and safety with ADLs/mobility.     Follow Up Recommendations  Home health OT;Supervision/Assistance - 24 hour    Equipment Recommendations  3 in 1 bedside commode    Recommendations for Other Services       Precautions / Restrictions Precautions Precautions: Fall Restrictions Weight Bearing Restrictions: No      Mobility Bed Mobility Overal bed mobility: Needs Assistance Bed Mobility: Sit to Supine       Sit to supine: Min guard   General bed mobility comments: increased time and cueing for sequencing, min guard for safety   Transfers Overall transfer level: Needs assistance Equipment used: Rolling walker (2 wheeled) Transfers: Sit to/from Stand Sit to Stand: Min assist         General transfer comment: min assist to power up into standing, increased time and effort required with cueing for safety and balance.      Balance Overall balance assessment: Needs  assistance Sitting-balance support: Feet supported;No upper extremity supported Sitting balance-[name]: Fair     Standing balance support: Bilateral upper extremity supported;During functional activity Standing balance-[name]: Poor Standing balance comment: Reliant on RW for support at this time.                            ADL either performed or assessed with clinical judgement   ADL Overall ADL's : Needs assistance/impaired     Grooming: Minimal assistance;Standing;Wash/dry hands   Upper Body Bathing: Minimal assistance;Sitting   Lower Body Bathing: Moderate assistance;Sit to/from stand   Upper Body Dressing : Minimal assistance;Sitting   Lower Body Dressing: Moderate assistance;Sit to/from stand   Toilet Transfer: Minimal assistance;Ambulation;RW Toilet Transfer Details (indicate cue type and reason): to recliner          Functional mobility during ADLs: Rolling walker;Moderate assistance General ADL Comments: poor coordination, strength and balance      Vision   Additional Comments: appears [facility]      Perception     Praxis      Pertinent Vitals/Pain Pain Assessment: No/denies pain     Hand Dominance Right   Extremity/Trunk Assessment Upper Extremity Assessment Upper Extremity Assessment: Generalized weakness(poor coordination, difficulty reaching to turn sink on )   Lower Extremity Assessment Lower Extremity Assessment: Defer to PT evaluation   Cervical / Trunk Assessment Cervical / Trunk Assessment: Normal;Other exceptions Cervical / Trunk Exceptions: Forward head posture with rounded shoulders   Communication Communication Communication: No difficulties   Cognition Arousal/Alertness: Lethargic Behavior During Therapy: Flat affect Overall Cognitive Status: Impaired/Different from  baseline Area of Impairment: Orientation;Memory;Following commands;Safety/judgement;Attention;Awareness;Problem solving                  Orientation Level: Disoriented to;Time;Situation Current Attention Level: Sustained Memory: Decreased short-term memory Following Commands: Follows one step commands consistently;Follows one step commands with increased time;Follows multi-step commands inconsistently Safety/Judgement: Decreased awareness of safety;Decreased awareness of deficits Awareness: Intellectual Problem Solving: Slow processing;Decreased initiation;Difficulty sequencing;Requires verbal cues     General Comments       Exercises     Shoulder Instructions      Home Living Family/patient expects to be discharged to:: Private residence Living Arrangements: Spouse/significant other Available Help at Discharge: Family;Available PRN/intermittently Type of Home: House Home Access: Stairs to enter [employer] CorporationEntrance Stairs-Number of Steps: 7 Entrance Stairs-Rails: Left Home Layout: Two level Alternate Level Stairs-Number of Steps: 13 Alternate Level Stairs-Rails: None Bathroom Shower/Tub: Walk-in shower         Home Equipment: Shower seat - built in;Crutches          Prior Functioning/Environment Level of Independence: Independent        Comments: Drives, owns a trucking company        OT Problem List: Decreased strength;Impaired balance (sitting and/or standing);Decreased activity tolerance;Decreased safety awareness;Decreased cognition;Decreased coordination;Decreased knowledge of use of DME or AE;Decreased knowledge of precautions      OT Treatment/Interventions: Self-care/ADL training;Therapeutic exercise;Energy conservation;DME and/or AE instruction;Therapeutic activities;Patient/family education;Balance training    OT Goals(Current goals can be found in the care plan section) Acute Rehab OT Goals Patient Stated Goal: none stated  Time For Goal Achievement: 08/16/18 Potential to Achieve Goals: Good  OT Frequency: Min 2X/week   Barriers to D/C:            Co-evaluation               AM-PAC OT "6 Clicks" Daily Activity     Outcome Measure Help from another person eating meals?: A Little Help from another person taking care of personal grooming?: A Little Help from another person toileting, which includes using toliet, bedpan, or urinal?: A Lot Help from another person bathing (including washing, rinsing, drying)?: A Little Help from another person to put on and taking off regular upper body clothing?: A Little Help from another person to put on and taking off regular lower body clothing?: A Lot 6 Click Score: 16   End of Session Equipment Utilized During Treatment: Rolling walker Nurse Communication: Mobility status  Activity Tolerance: Patient tolerated treatment well Patient left: in bed;with call bell/phone within reach;with bed alarm set;with nursing/sitter in room  OT Visit Diagnosis: Other abnormalities of gait and mobility (R26.89);Muscle weakness (generalized) (M62.81);Other symptoms and signs involving cognitive function                Time: 1530-1546 OT Time Calculation (min): 16 min Charges:  OT General Charges $OT Visit: 1 Visit OT Evaluation $OT Eval Moderate Complexity: 1 Mod  Chancy Milroyhristie S Artemus Romanoff, OT Acute Rehabilitation Services Pager [phone] Office [phone]   Chancy MilroyChristie S Irja Wheless 08/02/2018, 4:28 PM

## 2018-08-02 NOTE — Discharge Summary (Signed)
[facility]  Patient name: Leroy LawmanWilliam T Xxxdurham Medical record number: [id number] Date of birth: 29-Apr-1960 Age: 58 y.o. Gender: male Date of Admission: 07/28/2018  Date of Discharge: 08/06/2018 Admitting Physician: Leeroy Bockhelsey L Anderson, DO  Primary Care Provider: Patient, No Pcp Per Consultants: None  Indication for Hospitalization: AMS secondary to acute alcohol withdrawal  Discharge Diagnoses/Problem List:  Patient Active Problem List   Diagnosis Date Noted  . Transaminitis   . Altered mental status   . Elevated bilirubin   . Essential hypertension   . Viral URI with cough   . Sinus tachycardia   . Elevated blood-pressure reading without diagnosis of hypertension   . Alcohol withdrawal ([name]) 07/28/2018    [facility]  Discharge Condition: Improving but stable   Discharge Exam:  General: NAD, pleasant, able to answer basic questions Cardiac: RRR, normal heart sounds, no murmurs. 2+ radial and PT pulses bilaterally Respiratory: CTAB, normal effort, No wheezes, rales or rhonchi Abdomen: soft, nontender, nondistended, no hepatic or splenomegaly, +BS Extremities: no edema or cyanosis. WWP. Skin: warm and dry, no rashes noted Neuro: alert and oriented x4, no focal deficits Psych: Normal affect and mood   Brief Hospital Course:  Leroy LawmanWilliam T Xxxdurham is a 58 y.o. male who presents with  Chief Complaint  Patient presents with  . Hallucinations  . Alcohol Problem   Patient presented to the ED with acute alcohol withdrawal with delirium tremens.  Patient was immediately started on CIWA protocol due to elevated CIWA scores.  Patient was initially also started on Librium to help manage withdrawal.  Once he was became less than 5 consistently, patient had still a altered mental status.  He was more drowsy, however, he was alert and oriented x3.  Patient's wife was called.  Over the phone she reported that patient's mental  status was still altered from baseline.  Librium and Ativan were held for greater than 24 hours.  Patient's he was still remained less than 3.  But, patient still had persistent altered mental status.  CMP and CBC were evaluated.  She has a mild elevated transaminitis and the AST in the 70s.  Ammonia levels were within normal limits.  Patient had CPAP and ABG was obtained but did not show CO2 retention. Both CT head and MRI were negative for any intracranial abnormalities.   Patient had mild hyperbilirubinemia with indirect greater than direct.  Right upper quadrant ultrasound was obtained and showed no biliary tract obstruction.  Patient had no abdominal pain associated with this.  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  Is felt that alcohol abuse is the reason for the transaminitis. Patient experienced delirium 5 day into his hospitalization, however he continue to improve after ativan and librium were discontinued. RPR, B12 within normal limits. Patient was alert and oriented x 3 on discharge day. Patient will have HH PT/OT given deconditioning during admission.    Issues for Follow Up:  1. PT/OT on discharge with home health. 2. Continue Thiamine and Folate. 3. Establish care with PCP. 4. Follow up with GI for likely alcoholic liver disease. 5. Repeat CMP within 7 day from discharge. 6. Alcohol cessation counseling.  Significant Procedures:   Significant Labs and Imaging:  Recent Labs  Lab 08/03/18 0919 08/06/18 0345  WBC 6.2 8.5  HGB 14.1 15.2  HCT 42.3 44.2  PLT 133* 179   Recent Labs  Lab 08/01/18 0725 08/02/18 0940 08/03/18 0919  08/04/18 0452 08/06/18 0345  NA 137  --  136  --  137  K 3.9  --  3.5  --  3.8  CL 105  --  101  --  102  CO2 24  --  25  --  24  GLUCOSE 95  --  108*  --  124*  BUN 10  --  9  --  9  CREATININE 0.84  --  0.84 0.94 0.86  CALCIUM 9.6  --  10.2  --  10.9*  ALKPHOS 138* 128* 145*  --  132*  AST 59* 61* 59*   --  62*  ALT 29 31 32  --  37  ALBUMIN 3.4* 3.5 3.7  --  3.5     Results/Tests Pending at Time of Discharge:   Discharge Medications:  Allergies as of 08/06/2018   No Known Allergies     Medication List    TAKE these medications   folic acid 1 MG tablet Commonly known as:  FOLVITE Take 1 tablet (1 mg total) by mouth daily. Start taking on:  August 07, 2018   Influenza vac split quadrivalent PF 0.5 ML injection Commonly known as:  FLUARIX Inject 0.5 mLs into the muscle tomorrow at 10 am for 1 dose.   pneumococcal 23 valent vaccine 25 MCG/0.5ML injection Commonly known as:  PNU-IMMUNE Inject 0.5 mLs into the muscle tomorrow at 10 am for 1 dose.   thiamine 100 MG tablet Take 1 tablet (100 mg total) by mouth daily. Start taking on:  August 07, 2018       Discharge Instructions: Please refer to Patient Instructions section of EMR for full details.  Patient was counseled important signs and symptoms that should prompt return to medical care, changes in medications, dietary instructions, activity restrictions, and follow up appointments.   Follow-Up Appointments: Follow-up Information    [facility] Follow up on 08/08/2018.   Why:   Post hospital follow up scheduled with Dr. Rodena Medin on 08/08/2018 at 9:30 am Contact information: [address] [place] [address] [phone]          Lovena Neighbours, MD 08/06/2018, 4:37 PM PGY-3, [facility]

## 2018-08-02 NOTE — Progress Notes (Signed)
RT placed patient on CPAP HS auto. NO O2 bleed in needed. 

## 2018-08-02 NOTE — Progress Notes (Signed)
Went to room to re-evaluate patients mental status. Patient was sitting up and appeared more energetic. I called his wife from the room phone and had the patient talk. She reported that the patient was not oriented to the situation and there is something "very wrong with him". It appeared that the patient was confabulating a story about planting a garden on the phone and after he recently had gotten out of the hospital. The nurses also report that the patient has had difficulty completing his meals and that he has been trying to eat inedible objects such as paper and plastic containers. 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

## 2018-08-02 NOTE — Progress Notes (Signed)
Pt was trying to get out of bed. 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 Pt told this nurse and NT if we get him a CPAP he will sleep like a baby tonight. Notified MD on call that pt requesting CPAP tonight. MD ordered CPAP for qhs. Notified RT to come set pt up on CPAP. Will continue to monitor pt. Nelda Marseille, RN

## 2018-08-03 DIAGNOSIS — R740 Nonspecific elevation of levels of transaminase and lactic acid dehydrogenase [LDH]: Secondary | ICD-10-CM

## 2018-08-03 DIAGNOSIS — R7401 Elevation of levels of liver transaminase levels: Secondary | ICD-10-CM

## 2018-08-03 DIAGNOSIS — R4182 Altered mental status, unspecified: Secondary | ICD-10-CM

## 2018-08-03 LAB — RPR: RPR Ser Ql: NONREACTIVE

## 2018-08-03 LAB — COMPREHENSIVE METABOLIC PANEL
ALT: 32 U/L (ref 0–44)
AST: 59 U/L — ABNORMAL HIGH (ref 15–41)
Albumin: 3.7 g/dL (ref 3.5–5.0)
Alkaline Phosphatase: 145 U/L — ABNORMAL HIGH (ref 38–126)
Anion gap: 10 (ref 5–15)
BUN: 9 mg/dL (ref 6–20)
CO2: 25 mmol/L (ref 22–32)
Calcium: 10.2 mg/dL (ref 8.9–10.3)
Chloride: 101 mmol/L (ref 98–111)
Creatinine, Ser: 0.84 mg/dL (ref 0.61–1.24)
GFR calc Af Amer: >60 mL/min (ref >60)
GFR calc non Af Amer: >60 mL/min (ref >60)
Glucose, Bld: 108 mg/dL — ABNORMAL HIGH (ref 70–99)
Potassium: 3.5 mmol/L (ref 3.5–5.1)
Sodium: 136 mmol/L (ref 135–145)
Total Bilirubin: 2.8 mg/dL — ABNORMAL HIGH (ref 0.3–1.2)
Total Protein: 8 g/dL (ref 6.5–8.1)

## 2018-08-03 LAB — CBC
HCT: 42.3 % (ref 39.0–52.0)
Hemoglobin: 14.1 g/dL (ref 13.0–17.0)
MCH: 34 pg (ref 26.0–34.0)
MCHC: 33.3 g/dL (ref 30.0–36.0)
MCV: 101.9 fL — ABNORMAL HIGH (ref 80.0–100.0)
Platelets: 133 10*3/uL — ABNORMAL LOW (ref 150–400)
RBC: 4.15 MIL/uL — ABNORMAL LOW (ref 4.22–5.81)
RDW: 11.9 % (ref 11.5–15.5)
WBC: 6.2 10*3/uL (ref 4.0–10.5)
nRBC: 0 % (ref 0.0–0.2)

## 2018-08-03 LAB — HIV ANTIBODY (ROUTINE TESTING W REFLEX): HIV Screen 4th Generation wRfx: NONREACTIVE

## 2018-08-03 NOTE — Progress Notes (Signed)
Physical Therapy Treatment Patient Details Name: Leroy Bell MRN: [id number] DOB: Mar 25, 1961 Today's Date: 08/03/2018    History of Present Illness Pt is a 58 y/o male admitted for confusion, visual hallucinations, tremor and diagnosed with delirium.  Patient also noted subjective fevers and dry cough. Covid-19 negative. Pt admitted for ETOH withdrawal.     PT Comments    Pt performed gait training and functional mobility.  On arrival patient scooted to edge of seat on recliner leg rest attempting to stand.  Pt is slow to process, presented with urinary incontinence and had multiple LOB during session.  Pt will require assistance at home unless he improves.     Follow Up Recommendations  Home health PT;Supervision for mobility/OOB     Equipment Recommendations  Rolling walker with 5" wheels;3in1 (PT)    Recommendations for Other Services       Precautions / Restrictions Precautions Precautions: Fall Restrictions Weight Bearing Restrictions: No    Mobility  Bed Mobility Overal bed mobility: Needs Assistance Bed Mobility: Sit to Supine     Supine to sit: Min guard     General bed mobility comments: Pt in recliner on arrival.    Transfers Overall transfer level: Needs assistance Equipment used: Rolling walker (2 wheeled) Transfers: Sit to/from Stand Sit to Stand: Min assist;Mod assist         General transfer comment: Pt required min to moderate assistance to power up into standing.  pt presents with urinary incontinence after 1st trial, after second trial LOB to the R and posterior.    Ambulation/Gait Ambulation/Gait assistance: Min assist;Mod assist;+2 safety/equipment Gait Distance (Feet): 130 Feet Assistive device: Rolling walker (2 wheeled) Gait Pattern/deviations: Decreased stride length;Shuffle;Trunk flexed;Narrow base of support Gait velocity: Decreased   General Gait Details: Drifting to the L and occasionally requiring assist for pt to avoid  obstacles on the L side with the walker. Min assist/ mod assistance for balance and walker management.    Stairs             Wheelchair Mobility    Modified Rankin (Stroke Patients Only)       Balance Overall balance assessment: Needs assistance Sitting-balance support: Feet supported;No upper extremity supported Sitting balance-[name]: Fair       Standing balance-[name]: Poor Standing balance comment: Reliant on RW for support at this time.                             Cognition Arousal/Alertness: Lethargic Behavior During Therapy: Flat affect Overall Cognitive Status: Impaired/Different from baseline Area of Impairment: Orientation;Memory;Following commands;Safety/judgement;Attention;Awareness;Problem solving                 Orientation Level: Disoriented to;Time;Situation Current Attention Level: Sustained Memory: Decreased short-term memory Following Commands: Follows one step commands consistently;Follows one step commands with increased time;Follows multi-step commands inconsistently Safety/Judgement: Decreased awareness of safety;Decreased awareness of deficits Awareness: Intellectual Problem Solving: Slow processing;Decreased initiation;Difficulty sequencing;Requires verbal cues General Comments: Attempted pill box test even for 1 prescription and pt unable to keep attention sustained for enough time to complete 1. Pt able to read labels- may  be ore appropriate when mentation clears prior to d/c. Pt unable to complete pill box test at this time. Pt requiring increased time for functional tasks and to answer any questions.      Exercises      General Comments General comments (skin integrity, edema, etc.): Pt unable to perform pill  box test and poor fine motor coordination at this time,.      Pertinent Vitals/Pain Pain Assessment: No/denies pain    Home Living                      Prior Function            PT  Goals (current goals can now be found in the care plan section) Acute Rehab PT Goals Patient Stated Goal: none stated  Potential to Achieve Goals: Good Progress towards PT goals: Progressing toward goals    Frequency    Min 3X/week      PT Plan Current plan remains appropriate    Co-evaluation              AM-PAC PT "6 Clicks" Mobility   Outcome Measure  Help needed turning from your back to your side while in a flat bed without using bedrails?: None Help needed moving from lying on your back to sitting on the side of a flat bed without using bedrails?: A Little Help needed moving to and from a bed to a chair (including a wheelchair)?: A Little Help needed standing up from a chair using your arms (e.g., wheelchair or bedside chair)?: A Lot Help needed to walk in hospital room?: A Lot Help needed climbing 3-5 steps with a railing? : A Little 6 Click Score: 17    End of Session Equipment Utilized During Treatment: Gait belt Activity Tolerance: Patient tolerated treatment well Patient left: in chair;with call bell/phone within reach;with nursing/sitter in room Nurse Communication: Mobility status PT Visit Diagnosis: Unsteadiness on feet (R26.81);Difficulty in walking, not elsewhere classified (R26.2)     Time: 9983-3825 PT Time Calculation (min) (ACUTE ONLY): 33 min  Charges:  $Gait Training: 8-22 mins $Therapeutic Activity: 8-22 mins                     Joycelyn Rua, PTA Acute Rehabilitation Services Pager [phone] Office [phone]     Leroy Bell Artis Delay 08/03/2018, 4:54 PM

## 2018-08-03 NOTE — Progress Notes (Signed)
Occupational Therapy Treatment Patient Details Name: Leroy BarreWilliam T Potash MRN: [id number] DOB: 12/26/1960 Today's Date: 08/03/2018    History of present illness Pt is a 58 y/o male admitted for confusion, visual hallucinations, tremor and diagnosed with delirium.  Patient also noted subjective fevers and dry cough. Covid-19 negative. Pt admitted for ETOH withdrawal.    OT comments  Pt remains confused and with poor awareness of current deficits. Very little communication was comprehensive from pt. Pt attempting a portion of the Pill Box Test and pt able to read prescription bottles with increased assist time and some visual cues on where to look; pt unable to properly place fake pills into container in 3/3 of trials with verbal cues. Pt minA for sit to stand and modA for transfer to recliner- chair alarm set. Pt with poor attention span and unable to fully participate with OT at this time. Pt willing to participate and OT to follow acutely. Recommending HHOT with 24/7 supervision.     Follow Up Recommendations  Home health OT;Supervision/Assistance - 24 hour    Equipment Recommendations  3 in 1 bedside commode    Recommendations for Other Services      Precautions / Restrictions Precautions Precautions: Fall Restrictions Weight Bearing Restrictions: No       Mobility Bed Mobility Overal bed mobility: Needs Assistance Bed Mobility: Sit to Supine     Supine to sit: Min guard     General bed mobility comments: increased time and cueing for sequencing, min guard for safety   Transfers Overall transfer level: Needs assistance Equipment used: Rolling walker (2 wheeled) Transfers: Sit to/from Stand Sit to Stand: Min assist         General transfer comment: power up and safety to continue to transfer to recliner    Balance Overall balance assessment: Needs assistance   Sitting balance-[name]: Fair       Standing balance-[name]: Poor Standing balance comment:  Reliant on RW for support at this time.                            ADL either performed or assessed with clinical judgement   ADL Overall ADL's : Needs assistance/impaired                                     Functional mobility during ADLs: Rolling walker;Moderate assistance General ADL Comments: poor coordination, strength and balance; staring at a spot on the floor and requring max VCs to attend to task.     Vision   Vision Assessment?: No apparent visual deficits   Perception     Praxis      Cognition Arousal/Alertness: Lethargic Behavior During Therapy: Flat affect Overall Cognitive Status: Impaired/Different from baseline Area of Impairment: Orientation;Memory;Following commands;Safety/judgement;Attention;Awareness;Problem solving                 Orientation Level: Disoriented to;Time;Situation Current Attention Level: Sustained Memory: Decreased short-term memory Following Commands: Follows one step commands consistently;Follows one step commands with increased time;Follows multi-step commands inconsistently Safety/Judgement: Decreased awareness of safety;Decreased awareness of deficits     General Comments: Attempted pill box test even for 1 prescription and pt unable to keep attention sustained for enough time to complete 1. Pt able to read labels- may  be ore appropriate when mentation clears prior to d/c. Pt unable to complete pill box test at this  time. Pt requiring increased time for functional tasks and to answer any questions.        Exercises     Shoulder Instructions       General Comments Pt unable to perform pill box test and poor fine motor coordination at this time,.    Pertinent Vitals/ Pain       Pain Assessment: No/denies pain  Home Living                                          Prior Functioning/Environment              Frequency  Min 3X/week        Progress Toward Goals  OT  Goals(current goals can now be found in the care plan section)  Progress towards OT goals: Progressing toward goals  Acute Rehab OT Goals Patient Stated Goal: none stated  Time For Goal Achievement: 08/16/18 Potential to Achieve Goals: Good ADL Goals Pt Will Perform Grooming: with supervision;standing Pt Will Perform Lower Body Dressing: with supervision;sit to/from stand Pt Will Transfer to Toilet: with supervision;ambulating;bedside commode Pt Will Perform Tub/Shower Transfer: Shower transfer;3 in 1;ambulating;rolling walker Pt/caregiver will Perform Home Exercise Program: Both right and left upper extremity;With written HEP provided;Increased strength Additional ADL Goal #1: Pt will complete 3 step trail making task with minimal cueing.  Plan Discharge plan remains appropriate    Co-evaluation                 AM-PAC OT "6 Clicks" Daily Activity     Outcome Measure   Help from another person eating meals?: A Little Help from another person taking care of personal grooming?: A Little Help from another person toileting, which includes using toliet, bedpan, or urinal?: A Lot Help from another person bathing (including washing, rinsing, drying)?: A Little Help from another person to put on and taking off regular upper body clothing?: A Little Help from another person to put on and taking off regular lower body clothing?: A Lot 6 Click Score: 16    End of Session Equipment Utilized During Treatment: Rolling walker  OT Visit Diagnosis: Other abnormalities of gait and mobility (R26.89);Muscle weakness (generalized) (M62.81);Other symptoms and signs involving cognitive function   Activity Tolerance Patient tolerated treatment well   Patient Left in chair;with call bell/phone within reach;with chair alarm set   Nurse Communication Mobility status        Time: 0254-2706 OT Time Calculation (min): 27 min  Charges: OT General Charges $OT Visit: 1 Visit OT  Treatments $Self Care/Home Management : 8-22 mins $Neuromuscular Re-education: 8-22 mins  Cristi Loron) Glendell Docker OTR/L Acute Rehabilitation Services Pager: [phone] Office: [phone]    Gunnar Fusi Rexton Greulich 08/03/2018, 4:03 PM

## 2018-08-03 NOTE — Progress Notes (Signed)
Pt placed on nasal CPAP auto titrate tolerating well RT will monitor

## 2018-08-03 NOTE — Progress Notes (Signed)
Patient intermittently confused throughout the shift. Requires frequent redirection. Patient keeps saying "I need to go water my garden." Very slow with responses however able to answer orientation questions. Attempts to get up without assistance almost every 15 minutes. Bed alarm on and audible. Will continue to monitor.  Lyndal Pulley, RN 08/03/2018 6:01 PM

## 2018-08-03 NOTE — Progress Notes (Signed)
During shift assessment patient inquired how long it would take to stop drinking. RN asked patient if he was interested in ETOH cessation; patient stated yes, he was interested in stopping drinking. Upon further questioning by RN, patient stated drinks 6-8 beers 6 days per week and up to 15 beers on nights he watches football. Patient indicated that he started drinking heavily 6-7 years prior after the death of his father and felt as though now would be a time to stop. Patient stated support system at home consists of his daughter and son. RN asked if it would be ok to share information with the attending MD and a social worker to help identify available resources and support to assist with ETOH cessation; patient stated ok to discuss with care team.

## 2018-08-03 NOTE — Progress Notes (Signed)
Family Medicine Teaching Service Daily Progress Note Intern Pager: [phone]  [facility] number: [id number] Date of birth: 1960-10-31 Age: 58 y.o. Gender: male  Primary Care Provider: Patient, No Pcp Per Consultants: SW, dietitian  Code Status: Full Code   Pt Overview and Major Events to Date:  Admitted: 07/28/2018 for CC: Hallucinations and Alcohol Problem  Hospital Day: 7   Assessment and Plan: Leroy BarreWilliam T Bell is a 58 y.o. male admitted for delirium tremens.  His chronic conditions include alcohol use disorder  AMS. Likely delirium d/t alcohol and ativan as no focal findings, MRI brain neg, and appears to be clearing. Speech and thought process slow but oriented x3 this morning.  - monitor mental status, anticipate continued improvement over next 24h  Acute alcohol withdrawal. Stable, improving. CIWAs 8, 8, 5, 4. Has not needed any prn ativan in last 48h - CIWA protocol.  - Continue thiamine , continue MVI, continue folic acid  Mild transaminitis, improving. LFTs and Tbili stable. Elevated likely due to alcohol use. - Will need hep B booster after discharge  - Will need outpatient GI follow up  Hyperlipidemia  - follow up outpatient, likely needs statin  FEN/GI: regular diet   PPx:  Lovenox Q24H  Disposition: Home with HHPT/OT pending AMS improvement. Likely will be ready in next 24-48h   Subjective:  Feels well today. States that he is talking slower than normal. Remembers being more confused but thinks he is better today.   Objective: BP (!) 146/73 (BP Location: Left Arm)   Pulse 77   Temp 97.9 F (36.6 C)   Resp 18   Ht 5\' 9"  (1.753 m)   Wt 80.4 kg   SpO2 97%   BMI 26.18 kg/m  Intake/Output      04/07 0701 - 04/08 0700 04/08 0701 - 04/09 0700   P.O. 240    Total Intake(mL/kg) 240 (3)    Urine (mL/kg/hr) 200 (0.1)    Total Output 200    Net +40         Urine Occurrence 1 x      Physical Exam: General: laying in bed,  in NAD Cardiovascular: RRR  Respiratory: CTAB, NWOB on RA Abdomen: soft, nontender, nondistended, + bowel sounds Extremities: WWP, no LE edema Neuro: alert. Oriented to person, place and time. Slowed thought process and speech. CN II-XII intact. No pronator drift   Laboratory:  CBC: Recent Labs  Lab 07/28/18 1644 07/29/18 0810 07/30/18 0419  WBC 6.6 5.5 5.6  NEUTROABS 4.9  --   --   HGB 14.3 12.9* 13.1  HCT 42.0 38.1* 38.7*  MCV 99.8 100.0 101.3*  PLT PLATELET CLUMPS NOTED ON SMEAR, UNABLE TO ESTIMATE 110* 115*   Basic Metabolic Panel: Recent Labs  Lab 07/28/18 1644 07/29/18 0810 07/30/18 0419 08/01/18 0725  NA 131* 136 136 137  K 3.6 3.8 3.7 3.9  CL 98 107 109 105  CO2 21* 20* 20* 24  GLUCOSE 132* 112* 102* 95  BUN 10 10 10 10   CREATININE 0.81 0.83 0.78 0.84  CALCIUM 10.2 9.7 9.4 9.6   GFR: Estimated Creatinine Clearance: 99.4 mL/min (by C-G formula based on SCr of 0.84 mg/dL).   Liver Function Tests: Recent Labs  Lab 07/28/18 1644 07/29/18 0810 07/30/18 0419 07/30/18 1117 08/01/18 0725 08/02/18 0940  AST 89* 75* 66*  --  59* 61*  ALT 34 30 27  --  29 31  ALKPHOS 179* 146* 132*  --  138* 128*  BILITOT 4.6* 5.5* 4.8* 4.5* 3.3* 3.5*  PROT 8.5* 7.3 7.3  --  7.2 8.2*  ALBUMIN 3.9 3.5 3.4*  --  3.4* 3.5     Imaging/Diagnostic Tests: Mr Brain Wo Contrast  Result Date: 08/02/2018 CLINICAL DATA:  Altered mental status EXAM: MRI HEAD WITHOUT CONTRAST TECHNIQUE: Multiplanar, multiecho pulse sequences of the brain and surrounding structures were obtained without intravenous contrast. COMPARISON:  None. FINDINGS: Brain: Mild atrophy without hydrocephalus. Negative for acute infarct. No significant chronic ischemic change. Negative for hemorrhage mass or edema. No midline shift. Vascular: Normal arterial flow voids Skull and upper cervical spine: Negative Sinuses/Orbits: Minimal mucosal edema paranasal sinuses. Normal orbit Other: None IMPRESSION: Mild atrophy.  No  acute abnormality. Electronically Signed   By: Marlan Palau M.D.   On: 08/02/2018 16:55   US Abdomen Limited Ruq  Result Date: 08/01/2018 CLINICAL DATA:  Elevated bilirubin. EXAM: ULTRASOUND ABDOMEN LIMITED RIGHT UPPER QUADRANT COMPARISON:  None. FINDINGS: Gallbladder: Physiologically distended. No gallstones or wall thickening visualized. No sonographic Murphy sign noted by sonographer. Common bile duct: Diameter: 5-6 mm, normal. Liver: No focal lesion identified. Diffusely heterogeneous and increased in parenchymal echogenicity. No definite contour nodularity. Portal vein is patent on color Doppler imaging with normal direction of blood flow towards the liver. IMPRESSION: 1. Increased heterogeneous hepatic parenchymal echogenicity, typically seen with hepatic steatosis or other chronic hepatocellular liver disease. No focal lesion. 2. Unremarkable sonographic appearance of gallbladder and biliary tree. Electronically Signed   By: Narda Rutherford M.D.   On: 08/01/2018 19:12    Leroy Her, DO 08/03/2018, 8:48 AM PGY-3, [facility] FPTS Intern pager: [phone], text pages welcome

## 2018-08-04 DIAGNOSIS — R410 Disorientation, unspecified: Secondary | ICD-10-CM

## 2018-08-04 LAB — CREATININE, SERUM
Creatinine, Ser: 0.94 mg/dL (ref 0.61–1.24)
GFR calc Af Amer: >60 mL/min (ref >60)
GFR calc non Af Amer: >60 mL/min (ref >60)

## 2018-08-04 NOTE — Progress Notes (Signed)
Placed Tele-sitter in room for patient per MD order. Awaiting call back from tele-sitter to give report so patient can be watched closely.

## 2018-08-04 NOTE — Progress Notes (Signed)
Gave report to [facility] for patient Tele-sitter monitor.

## 2018-08-04 NOTE — Progress Notes (Signed)
Page said patient had been attempting to get out of bed and wander at the room.  His nurses appropriately concerned that he could hurt himself as he has been a little unsteady on his feet.  He has not been getting aggressive in any way form.  Went to the room to assess the patient, he is politely insistent that he has been walking around the greenhouse all day yesterday and therefore he should not be a fall risk.  I discussed with him that this memory is not correct and he very politely does not believe me.  He did agree that he would never want to hurt anyone and that he would stay in bed if people would remind him because he says it is normal for him to want to get out of bed and he will keep forgetting.  His nurses and I agree that a Recruitment consultant for redirection is appropriate for him as restraints are not warranted in any way and medicinally sedating him would only delay his recovery.  Order for sitter placed Dr. Parke Simmers

## 2018-08-04 NOTE — Progress Notes (Addendum)
Family Medicine Teaching Service Daily Progress Note Intern Pager: [phone]  Patient name: Leroy Bell [facility] Medical record number: [id number] Date of birth: 1960-08-10 Age: 58 y.o. Gender: male  Primary Care Provider: Patient, No Pcp Per Consultants: SW, dietitian  Code Status: Full Code   Pt Overview and Major Events to Date:  Admitted: 07/28/2018 for CC: Hallucinations and Alcohol Problem  Hospital Day: 8   Assessment and Plan: Leroy Bell is a 58 y.o. male admitted for delirium tremens.  His chronic conditions include alcohol use disorder  AMS Patient continue to be confused. MRI was negative. TSH. RPR and HIV were also negative. Patient is only oriented x1 this am. AMS likely secondary to delirium. Possible sedating meds lingering due to poor clearance given alcoholic liver disease. d/t alcohol and ativan as no focal findings. --Continue to monitor mental status, --Sitter for reorientation  --Avoiding sedating medications  Acute alcohol withdrawal. Stable, improving. CIWAs 13>11. Has not needed any prn ativan in last 72 hours. -CIWA protocol.  -Continue thiamine, continue MVI, continue folic acid  Mild transaminitis, improving. LFTs and Tbili stable. Elevated likely due to alcohol use. -Will need hep B booster after discharge  -Will need outpatient GI follow up  Hyperlipidemia  -Follow up outpatient, likely needs statin  FEN/GI: regular diet   PPx:  Lovenox Q24H  Disposition: Home with HHPT/OT pending AMS improvement. Likely will be ready in next 24-48h   Subjective:  Feels well today. Patient is still confused this morning only oriented to name.   Objective: BP (!) 178/93   Pulse 80   Temp 98.1 F (36.7 C) (Oral)   Resp 20   Ht 5\' 9"  (1.753 m)   Wt 80.4 kg   SpO2 99%   BMI 26.18 kg/m  Intake/Output      04/08 0701 - 04/09 0700 04/09 0701 - 04/10 0700   P.O. 120    Total Intake(mL/kg) 120 (1.5)    Urine (mL/kg/hr)     Total Output     Net +120          Urine Occurrence 2 x      Physical Exam: General: laying in bed, in NAD Cardiovascular: RRR  Respiratory: CTAB, NWOB on RA Abdomen: soft, nontender, nondistended, + bowel sounds Extremities: WWP, no LE edema Neuro: alert. Oriented to person, place and time. Slowed thought process and speech. CN II-XII intact. No pronator drift   Laboratory:  CBC: Recent Labs  Lab 07/28/18 1644 07/29/18 0810 07/30/18 0419 08/03/18 0919  WBC 6.6 5.5 5.6 6.2  NEUTROABS 4.9  --   --   --   HGB 14.3 12.9* 13.1 14.1  HCT 42.0 38.1* 38.7* 42.3  MCV 99.8 100.0 101.3* 101.9*  PLT PLATELET CLUMPS NOTED ON SMEAR, UNABLE TO ESTIMATE 110* 115* 133*   Basic Metabolic Panel: Recent Labs  Lab 07/28/18 1644 07/29/18 0810 07/30/18 0419 08/01/18 0725 08/03/18 0919 08/04/18 0452  NA 131* 136 136 137 136  --   K 3.6 3.8 3.7 3.9 3.5  --   CL 98 107 109 105 101  --   CO2 21* 20* 20* 24 25  --   GLUCOSE 132* 112* 102* 95 108*  --   BUN 10 10 10 10 9   --   CREATININE 0.81 0.83 0.78 0.84 0.84 0.94  CALCIUM 10.2 9.7 9.4 9.6 10.2  --    GFR: Estimated Creatinine Clearance: 88.8 mL/min (by C-G formula based on SCr of 0.94 mg/dL).   Liver Function Tests:  Recent Labs  Lab 07/29/18 0810 07/30/18 0419 07/30/18 1117 08/01/18 0725 08/02/18 0940 08/03/18 0919  AST 75* 66*  --  59* 61* 59*  ALT 30 27  --  29 31 32  ALKPHOS 146* 132*  --  138* 128* 145*  BILITOT 5.5* 4.8* 4.5* 3.3* 3.5* 2.8*  PROT 7.3 7.3  --  7.2 8.2* 8.0  ALBUMIN 3.5 3.4*  --  3.4* 3.5 3.7     Imaging/Diagnostic Tests: Mr Brain Wo Contrast  Result Date: 08/02/2018 CLINICAL DATA:  Altered mental status EXAM: MRI HEAD WITHOUT CONTRAST TECHNIQUE: Multiplanar, multiecho pulse sequences of the brain and surrounding structures were obtained without intravenous contrast. COMPARISON:  None. FINDINGS: Brain: Mild atrophy without hydrocephalus. Negative for acute infarct. No significant chronic ischemic change. Negative for hemorrhage mass  or edema. No midline shift. Vascular: Normal arterial flow voids Skull and upper cervical spine: Negative Sinuses/Orbits: Minimal mucosal edema paranasal sinuses. Normal orbit Other: None IMPRESSION: Mild atrophy.  No acute abnormality. Electronically Signed   By: Marlan Palauharles  Clark M.D.   On: 08/02/2018 16:55   [place] Abdomen Limited Ruq  Result Date: 08/01/2018 CLINICAL DATA:  Elevated bilirubin. EXAM: ULTRASOUND ABDOMEN LIMITED RIGHT UPPER QUADRANT COMPARISON:  None. FINDINGS: Gallbladder: Physiologically distended. No gallstones or wall thickening visualized. No sonographic Murphy sign noted by sonographer. Common bile duct: Diameter: 5-6 mm, normal. Liver: No focal lesion identified. Diffusely heterogeneous and increased in parenchymal echogenicity. No definite contour nodularity. Portal vein is patent on color Doppler imaging with normal direction of blood flow towards the liver. IMPRESSION: 1. Increased heterogeneous hepatic parenchymal echogenicity, typically seen with hepatic steatosis or other chronic hepatocellular liver disease. No focal lesion. 2. Unremarkable sonographic appearance of gallbladder and biliary tree. Electronically Signed   By: Narda RutherfordMelanie  Sanford M.D.   On: 08/01/2018 19:12    Lovena Neighboursiallo, Jasmia Angst, MD 08/04/2018, 8:52 AM PGY-3, [place] [facility] FPTS Intern pager: [phone], text pages welcome

## 2018-08-04 NOTE — TOC Progression Note (Signed)
Transition of Care [facility]) - Progression Note    Patient Details  Name: Leroy Bell MRN: [id number] Date of Birth: 1960/11/22  Transition of Care [facility]) CM/SW Contact  Epifanio Lesches, RN Phone Number: 08/04/2018, 2:32 PM  Clinical Narrative:     [facility] Customer Service, [phone]. Office will search for in network .  [facility]  [phone], fax # [phone], if no agency in network but have found agency willing to accept call [phone]  Expected Discharge Plan: (Home) Barriers to Discharge: Continued Medical Work up  Expected Discharge Plan and Services Expected Discharge Plan: (Home) In-house Referral: Clinical Social Work Discharge Planning Services: Other - See comment(Provided substance resources to patient's wife) Post Acute Care Choice: NA                   DME Arranged: N/A DME Agency: NA HH Arranged: NA HH Agency: NA   Social Determinants of Health (SDOH) Interventions    Readmission Risk Interventions Readmission Risk Prevention Plan 07/30/2018  Post Dischage Appt Complete  Medication Screening Complete  Transportation Screening Complete  Some recent data might be hidden

## 2018-08-04 NOTE — Progress Notes (Signed)
Pt has clothes off all over bed not able to keep CPAP on spoke with RN we agreed not in best interested for patient at this time

## 2018-08-04 NOTE — Progress Notes (Signed)
Physical Therapy Treatment Patient Details Name: Leroy Bell MRN: [id number] DOB: [birth year] Today's Date: 08/04/2018    History of Present Illness Pt is a 58 y/o male admitted for confusion, visual hallucinations, tremor and diagnosed with delirium.  Patient also noted subjective fevers and dry cough. Covid-19 negative. Pt admitted for ETOH withdrawal.     PT Comments    Pt performed gait training and functional mobility.  He is slow to respond and remains to present with cognitive impairments.  He performed gait with decreased assistance.  Plan for HHPT with support from caregiver at home remains appropriate.     Follow Up Recommendations  Home health PT;Supervision for mobility/OOB     Equipment Recommendations  Rolling walker with 5" wheels;3in1 (PT)    Recommendations for Other Services       Precautions / Restrictions Precautions Precautions: Fall Restrictions Weight Bearing Restrictions: No    Mobility  Bed Mobility Overal bed mobility: Needs Assistance Bed Mobility: Supine to Sit     Supine to sit: Mod assist     General bed mobility comments: Pt required assistance to elevate trunk to edge of bed and advance LEs off.  Pt slow to respond.    Transfers Overall transfer level: Needs assistance Equipment used: Rolling walker (2 wheeled) Transfers: Sit to/from Stand Sit to Stand: Min assist         General transfer comment: min assistance to stand at edge of bed.  Cues for hand placement to and from seated surface.    Ambulation/Gait Ambulation/Gait assistance: +2 safety/equipment;Min assist Gait Distance (Feet): 160 Feet Assistive device: Rolling walker (2 wheeled) Gait Pattern/deviations: Decreased stride length;Shuffle;Trunk flexed;Narrow base of support Gait velocity: Decreased   General Gait Details: Pt with improve balance but remains to require min assistance for turns and backing.  Pt with poor posture and cues to gaze forward and extend trunk  required.     Stairs             Wheelchair Mobility    Modified Rankin (Stroke Patients Only)       Balance Overall balance assessment: Needs assistance Sitting-balance support: Feet supported;No upper extremity supported Sitting balance-[name]: Fair       Standing balance-[name]: Poor                              Cognition Arousal/Alertness: Lethargic Behavior During Therapy: Flat affect Overall Cognitive Status: Impaired/Different from baseline Area of Impairment: Orientation;Memory;Following commands;Safety/judgement;Attention;Awareness;Problem solving                 Orientation Level: Disoriented to;Time;Situation Current Attention Level: Sustained Memory: Decreased short-term memory Following Commands: Follows one step commands consistently;Follows one step commands with increased time;Follows multi-step commands inconsistently Safety/Judgement: Decreased awareness of safety;Decreased awareness of deficits Awareness: Intellectual Problem Solving: Slow processing;Decreased initiation;Difficulty sequencing;Requires verbal cues General Comments: [place] and [place] not know why he is here once re-oriented.        Exercises      General Comments        Pertinent Vitals/Pain Pain Assessment: No/denies pain    Home Living                      Prior Function            PT Goals (current goals can now be found in the care plan section) Acute Rehab PT Goals Patient Stated  Goal: none stated  Potential to Achieve Goals: Good Progress towards PT goals: Progressing toward goals    Frequency    Min 3X/week      PT Plan Current plan remains appropriate    Co-evaluation              AM-PAC PT "6 Clicks" Mobility   Outcome Measure  Help needed turning from your back to your side while in a flat bed without using bedrails?: None Help needed moving from lying on your back to sitting on the  side of a flat bed without using bedrails?: A Little Help needed moving to and from a bed to a chair (including a wheelchair)?: A Little Help needed standing up from a chair using your arms (e.g., wheelchair or bedside chair)?: A Lot Help needed to walk in hospital room?: A Lot Help needed climbing 3-5 steps with a railing? : A Little 6 Click Score: 17    End of Session Equipment Utilized During Treatment: Gait belt Activity Tolerance: Patient tolerated treatment well Patient left: in chair;with call Bell/phone within reach;with nursing/sitter in room Nurse Communication: Mobility status PT Visit Diagnosis: Unsteadiness on feet (R26.81);Difficulty in walking, not elsewhere classified (R26.2)     Time: 5852-7782 PT Time Calculation (min) (ACUTE ONLY): 25 min  Charges:  $Gait Training: 8-22 mins $Therapeutic Activity: 8-22 mins                     Joycelyn Rua, PTA Acute Rehabilitation Services Pager [phone] Office [phone]     Leroy Bell 08/04/2018, 4:38 PM

## 2018-08-05 NOTE — Progress Notes (Signed)
Family Medicine Teaching Service Daily Progress Note Intern Pager: [phone]  Patient name: Leroy Bell: [id number] Date of birth: 1960/08/09 Age: 58 y.o. Gender: male  Primary Care Provider: Patient, No Pcp Per Consultants: SW, dietitian  Code Status: Full Code   Pt Overview and Major Events to Date:  Admitted: 07/28/2018 for CC: Hallucinations and Alcohol Problem  Hospital Day: 9   Assessment and Plan: Leroy Bell is a 58 y.o. male admitted for delirium tremens.  His chronic conditions include alcohol use disorder  AMS likely secondary to delirium, improving Patient continue to be confused. MRI was negative. TSH. RPR and HIV were also negative. Patient is only oriented x3 this am. AMS likely secondary to delirium. Possible sedating meds lingering due to poor clearance given alcoholic liver disease. --Continue to monitor mental status --Sitter for reorientation  --Avoiding sedating medications  Acute alcohol withdrawal. Stable, improving. CIWAs 1>2>1 . Has not needed any prn ativan in in the past 4 days. -CIWA protocol.  -Continue thiamine, continue MVI, continue folic acid  Mild transaminitis, improving. LFTs and Tbili stable. Elevated likely due to alcohol use. -Will need hep B booster after discharge  -Will need outpatient GI follow up  Hyperlipidemia  -Follow up outpatient, likely needs statin  FEN/GI: regular diet   PPx:  Lovenox Q24H  Disposition: Home with HHPT/OT pending AMS improvement.   Subjective:  Patient with improved mentation this morning though still slightly confused as conversation carries on. Sitter present at bedside. No agitation, easily reoriented. Wife was updated by phone this morning.   Objective: BP (!) 137/59 (BP Location: Right Arm)   Pulse 86   Temp 98 F (36.7 C) (Oral)   Resp 17   Ht 5\' 9"  (1.753 m)   Wt 80.4 kg   SpO2 100%   BMI 26.18 kg/m  Intake/Output      04/09 0701 - 04/10 0700 04/10 0701 - 04/11  0700   P.O. 300    Total Intake(mL/kg) 300 (3.7)    Stool 210    Total Output 210    Net +90           Physical Exam: General: laying in bed, in NAD Cardiovascular: RRR  Respiratory: CTAB, NWOB on RA Abdomen: soft, nontender, nondistended, + bowel sounds Extremities: WWP, no LE edema Neuro: alert. Oriented to person, place and time. Slowed thought process and speech. CN II-XII intact. No pronator drift   Laboratory:  CBC: Recent Labs  Lab 07/30/18 0419 08/03/18 0919  WBC 5.6 6.2  HGB 13.1 14.1  HCT 38.7* 42.3  MCV 101.3* 101.9*  PLT 115* 133*   Basic Metabolic Panel: Recent Labs  Lab 07/30/18 0419 08/01/18 0725 08/03/18 0919 08/04/18 0452  NA 136 137 136  --   K 3.7 3.9 3.5  --   CL 109 105 101  --   CO2 20* 24 25  --   GLUCOSE 102* 95 108*  --   BUN 10 10 9   --   CREATININE 0.78 0.84 0.84 0.94  CALCIUM 9.4 9.6 10.2  --    GFR: Estimated Creatinine Clearance: 88.8 mL/min (by C-G formula based on SCr of 0.94 mg/dL).   Liver Function Tests: Recent Labs  Lab 07/30/18 0419 07/30/18 1117 08/01/18 0725 08/02/18 0940 08/03/18 0919  AST 66*  --  59* 61* 59*  ALT 27  --  29 31 32  ALKPHOS 132*  --  138* 128* 145*  BILITOT 4.8* 4.5* 3.3* 3.5* 2.8*  PROT 7.3  --  7.2 8.2* 8.0  ALBUMIN 3.4*  --  3.4* 3.5 3.7     Imaging/Diagnostic Tests: Mr Brain Wo Contrast  Result Date: 08/02/2018 CLINICAL DATA:  Altered mental status EXAM: MRI HEAD WITHOUT CONTRAST TECHNIQUE: Multiplanar, multiecho pulse sequences of the brain and surrounding structures were obtained without intravenous contrast. COMPARISON:  None. FINDINGS: Brain: Mild atrophy without hydrocephalus. Negative for acute infarct. No significant chronic ischemic change. Negative for hemorrhage mass or edema. No midline shift. Vascular: Normal arterial flow voids Skull and upper cervical spine: Negative Sinuses/Orbits: Minimal mucosal edema paranasal sinuses. Normal orbit Other: None IMPRESSION: Mild atrophy.   No acute abnormality. Electronically Signed   By: Marlan Palauharles  Clark M.D.   On: 08/02/2018 16:55   [place] Abdomen Limited Ruq  Result Date: 08/01/2018 CLINICAL DATA:  Elevated bilirubin. EXAM: ULTRASOUND ABDOMEN LIMITED RIGHT UPPER QUADRANT COMPARISON:  None. FINDINGS: Gallbladder: Physiologically distended. No gallstones or wall thickening visualized. No sonographic Murphy sign noted by sonographer. Common bile duct: Diameter: 5-6 mm, normal. Liver: No focal lesion identified. Diffusely heterogeneous and increased in parenchymal echogenicity. No definite contour nodularity. Portal vein is patent on color Doppler imaging with normal direction of blood flow towards the liver. IMPRESSION: 1. Increased heterogeneous hepatic parenchymal echogenicity, typically seen with hepatic steatosis or other chronic hepatocellular liver disease. No focal lesion. 2. Unremarkable sonographic appearance of gallbladder and biliary tree. Electronically Signed   By: Narda RutherfordMelanie  Sanford M.D.   On: 08/01/2018 19:12    Lovena Neighboursiallo, Joelly Bolanos, MD 08/05/2018, 12:05 PM PGY-3, [place] [facility] FPTS Intern pager: [phone], text pages welcome

## 2018-08-05 NOTE — Progress Notes (Addendum)
Nutrition Follow Up  RD working remotely  DOCUMENTATION CODES:   Not applicable  INTERVENTION:    Ensure Enlive po BID, each supplement provides 350 kcal and 20 grams of protein  NUTRITION DIAGNOSIS:   Poor nutrition quality of life related to chronic illness(alcoholism) as evidenced by Reportedly drinking 6-12 beers/day (as much as 17) at baseline, ongoing  GOAL:   Patient will meet greater than or equal to 90% of their needs, progressing  MONITOR:   PO intake, Supplement acceptance, Weight trends, I & O's, Labs  ASSESSMENT:  58 y/o male w/ PMHx htn, etoh abuse. Brought by EMS d/t having hallucinations. Had acutely stopped drinking 5 days ago. Pt reported fever and dry cough. Truck driver for living. Admitted for management of etoh withdrawal and covid rule out.  RD spoke with pt over room phone. He reports his appetite is "okay;" he is ordering breakfast. PO intake variable at 10-50% per flowsheet records.  Pt states he is drinking his Ensure Enlive supplements. Medications reviewed; receiving MVI daily. Labs reviewed. CBG 84.  Diet Order:    Diet Order            Diet regular Room service appropriate? Yes; Fluid consistency: Thin  Diet effective now             EDUCATION NEEDS:   No education needs have been identified at this time  Skin:  Skin Assessment: Reviewed RN Assessment  Last BM:  4/5  Height:   Ht Readings from Last 1 Encounters:  07/29/18 5\' 9"  (1.753 m)   Weight:   Wt Readings from Last 1 Encounters:  07/29/18 80.4 kg   Ideal Body Weight:  72.73 kg  BMI:  Body mass index is 26.18 kg/m.  Estimated Nutritional Needs:   Kcal:  1800-2000   Protein:  80-95 gm   Fluid:  1.8-2.0 L  Maureen Chatters, RD, LDN Pager #: [phone] After-Hours Pager #: [phone]

## 2018-08-05 NOTE — Progress Notes (Signed)
Occupational Therapy Treatment Patient Details Name: Leroy Bell MRN: [id number] DOB: Sep 19, 1960 Today's Date: 08/05/2018    History of present illness Pt is a 58 y/o male admitted for confusion, visual hallucinations, tremor and diagnosed with delirium.  Patient also noted subjective fevers and dry cough. Covid-19 negative. Pt admitted for ETOH withdrawal.    OT comments  Pt. Seen for skilled OT.  Pt. Able to complete bed mobility, simulated toileting task, and LB dressing with min a.   Follow Up Recommendations  Home health OT;Supervision/Assistance - 24 hour    Equipment Recommendations  3 in 1 bedside commode    Recommendations for Other Services      Precautions / Restrictions Precautions Precautions: Fall       Mobility Bed Mobility Overal bed mobility: Needs Assistance Bed Mobility: Supine to Sit     Supine to sit: Min guard     General bed mobility comments: Pt required assistance to elevate trunk to edge of bed and advance LEs off.  Pt slow to respond.    Transfers Overall transfer level: Needs assistance Equipment used: Rolling walker (2 wheeled) Transfers: Sit to/from [employer] Sit to Stand: Min assist Stand pivot transfers: Min assist       General transfer comment: min assistance to stand at edge of bed.  Cues for hand placement to and from seated surface.      Balance                                           ADL either performed or assessed with clinical judgement   ADL Overall ADL's : Needs assistance/impaired                     Lower Body Dressing: Min guard;Sitting/lateral leans   Toilet Transfer: Min guard;Ambulation;RW Toilet Transfer Details (indicate cue type and reason): to recliner  Toileting- Clothing Manipulation and Hygiene: Min guard;Sit to/from stand Toileting - Clothing Manipulation Details (indicate cue type and reason): simulated     Functional mobility during ADLs:  Rolling walker;Min guard General ADL Comments: poor coordination, strength and balance; staring at a spot on the floor and requring max VCs to attend to task.     Vision       Perception     Praxis      Cognition Arousal/Alertness: Awake/alert Behavior During Therapy: Flat affect Overall Cognitive Status: Impaired/Different from baseline Area of Impairment: Orientation;Memory;Following commands;Safety/judgement;Attention;Awareness;Problem solving                 Orientation Level: Disoriented to;Time;Situation Current Attention Level: Sustained Memory: Decreased short-term memory Following Commands: Follows one step commands consistently;Follows one step commands with increased time;Follows multi-step commands inconsistently Safety/Judgement: Decreased awareness of safety;Decreased awareness of deficits Awareness: Intellectual Problem Solving: Slow processing;Decreased initiation;Difficulty sequencing;Requires verbal cues          Exercises     Shoulder Instructions       General Comments      Pertinent Vitals/ Pain       Pain Assessment: No/denies pain  Home Living                                          Prior Functioning/Environment  Frequency  Min 3X/week        Progress Toward Goals  OT Goals(current goals can now be found in the care plan section)  Progress towards OT goals: Progressing toward goals     Plan Discharge plan remains appropriate    Co-evaluation                 AM-PAC OT "6 Clicks" Daily Activity     Outcome Measure   Help from another person eating meals?: A Little Help from another person taking care of personal grooming?: A Little Help from another person toileting, which includes using toliet, bedpan, or urinal?: A Lot Help from another person bathing (including washing, rinsing, drying)?: A Little Help from another person to put on and taking off regular upper body clothing?:  A Little Help from another person to put on and taking off regular lower body clothing?: A Lot 6 Click Score: 16    End of Session Equipment Utilized During Treatment: Rolling walker;Gait belt  OT Visit Diagnosis: Other abnormalities of gait and mobility (R26.89);Muscle weakness (generalized) (M62.81);Other symptoms and signs involving cognitive function   Activity Tolerance Patient tolerated treatment well   Patient Left in chair;with call bell/phone within reach;with nursing/sitter in room   Nurse Communication Other (comment)(asked nurse and sitter if chair alarm indicated and they said not needed if sitter present), notified rn I had spoken to pts. Wife on the phone per his request and he is reporting he is missing his phone. States it is an I phone with a phone case that has him and his dtr. On the back with a snap chat type filter on the picture.          Time: 1225-1240 OT Time Calculation (min): 15 min  Charges: OT General Charges $OT Visit: 1 Visit OT Treatments $Self Care/Home Management : 8-22 mins  Robet LeuMorris, Adea Geisel Lorraine, COTA/L 08/05/2018, 3:13 PM

## 2018-08-05 NOTE — Progress Notes (Addendum)
Physical Therapy Treatment Patient Details Name: Leroy Bell MRN: [id number] DOB: December 18, 1960 Today's Date: 08/05/2018    History of Present Illness Pt is a 58 y/o male admitted for confusion, visual hallucinations, tremor and diagnosed with delirium.  Patient also noted subjective fevers and dry cough. Covid-19 negative. Pt admitted for ETOH withdrawal.     PT Comments    Pt continues to present with poor balance.  He required min to moderate assistance to maintain and correct LOB.  Will update recommendations to SNF at this time.  Will inform supervising PT of need for change in recommendations based on deficits listed below.  Will continue acute PT to address functional deficits.      Follow Up Recommendations  SNF;Supervision/Assistance - 24 hour     Equipment Recommendations  Rolling walker with 5" wheels;3in1 (PT)    Recommendations for Other Services       Precautions / Restrictions Precautions Precautions: Fall Restrictions Weight Bearing Restrictions: No    Mobility  Bed Mobility Overal bed mobility: Needs Assistance Bed Mobility: Supine to Sit     Supine to sit: Min guard     General bed mobility comments: Pt required assistance to elevate trunk to edge of bed and advance LEs off.  Pt slow to respond.  Increased time to complete.    Transfers Overall transfer level: Needs assistance Equipment used: Rolling walker (2 wheeled) Transfers: Sit to/from [employer] Sit to Stand: Min assist Stand pivot transfers: Min assist       General transfer comment: POsterior lean with increased time to correct posture and balance.  Pt bracing against edge of bed.  Stood from elevate surface.    Ambulation/Gait Ambulation/Gait assistance: Min assist;Mod assist Gait Distance (Feet): 160 Feet Assistive device: Rolling walker (2 wheeled) Gait Pattern/deviations: Decreased stride length;Shuffle;Trunk flexed;Narrow base of support Gait velocity:  Decreased   General Gait Details: Pt with LOB x2 during gait training.  He presents to ambulate in L side of device.  Pt required increased assistance to correct LOB and to turn and back.    Stairs             Wheelchair Mobility    Modified Rankin (Stroke Patients Only)       Balance Overall balance assessment: Needs assistance   Sitting balance-[name]: Fair       Standing balance-[name]: Poor Standing balance comment: Reliant on RW for support at this time. LOB x2.                              Cognition Arousal/Alertness: Awake/alert Behavior During Therapy: Flat affect Overall Cognitive Status: Impaired/Different from baseline Area of Impairment: Orientation;Memory;Following commands;Safety/judgement;Attention;Awareness;Problem solving                 Orientation Level: Disoriented to;Time;Situation Current Attention Level: Sustained Memory: Decreased short-term memory Following Commands: Follows one step commands consistently;Follows one step commands with increased time;Follows multi-step commands inconsistently Safety/Judgement: Decreased awareness of safety;Decreased awareness of deficits Awareness: Intellectual Problem Solving: Slow processing;Decreased initiation;Difficulty sequencing;Requires verbal cues General Comments: Pt reports he has a husband.  Then corrected that he had a wife.        Exercises      General Comments        Pertinent Vitals/Pain Pain Assessment: No/denies pain    Home Living  Prior Function            PT Goals (current goals can now be found in the care plan section) Acute Rehab PT Goals Patient Stated Goal: none stated  Potential to Achieve Goals: Good Progress towards PT goals: Progressing toward goals    Frequency    Min 3X/week      PT Plan Discharge plan needs to be updated    Co-evaluation              AM-PAC PT "6 Clicks" Mobility    Outcome Measure  Help needed turning from your back to your side while in a flat bed without using bedrails?: None Help needed moving from lying on your back to sitting on the side of a flat bed without using bedrails?: A Little Help needed moving to and from a bed to a chair (including a wheelchair)?: A Little Help needed standing up from a chair using your arms (e.g., wheelchair or bedside chair)?: A Lot Help needed to walk in hospital room?: A Lot Help needed climbing 3-5 steps with a railing? : A Little 6 Click Score: 17    End of Session Equipment Utilized During Treatment: Gait belt Activity Tolerance: Patient tolerated treatment well Patient left: in chair;with call bell/phone within reach;with nursing/sitter in room Nurse Communication: Mobility status PT Visit Diagnosis: Unsteadiness on feet (R26.81);Difficulty in walking, not elsewhere classified (R26.2)     Time: 1610-96041504-1520 PT Time Calculation (min) (ACUTE ONLY): 16 min  Charges:  $Gait Training: 8-22 mins                     Joycelyn RuaAimee Jonah Nestle, PTA Acute Rehabilitation Services Pager [phone] Office [phone]     Mclean Moya Artis DelayJ Yaira Bernardi 08/05/2018, 3:33 PM

## 2018-08-06 LAB — COMPREHENSIVE METABOLIC PANEL
ALT: 37 U/L (ref 0–44)
AST: 62 U/L — ABNORMAL HIGH (ref 15–41)
Albumin: 3.5 g/dL (ref 3.5–5.0)
Alkaline Phosphatase: 132 U/L — ABNORMAL HIGH (ref 38–126)
Anion gap: 11 (ref 5–15)
BUN: 9 mg/dL (ref 6–20)
CO2: 24 mmol/L (ref 22–32)
Calcium: 10.9 mg/dL — ABNORMAL HIGH (ref 8.9–10.3)
Chloride: 102 mmol/L (ref 98–111)
Creatinine, Ser: 0.86 mg/dL (ref 0.61–1.24)
GFR calc Af Amer: >60 mL/min (ref >60)
GFR calc non Af Amer: >60 mL/min (ref >60)
Glucose, Bld: 124 mg/dL — ABNORMAL HIGH (ref 70–99)
Potassium: 3.8 mmol/L (ref 3.5–5.1)
Sodium: 137 mmol/L (ref 135–145)
Total Bilirubin: 2.4 mg/dL — ABNORMAL HIGH (ref 0.3–1.2)
Total Protein: 7.5 g/dL (ref 6.5–8.1)

## 2018-08-06 LAB — CBC WITH DIFFERENTIAL/PLATELET
Abs Immature Granulocytes: 0.03 10*3/uL (ref 0.00–0.07)
Basophils Absolute: 0.1 10*3/uL (ref 0.0–0.1)
Basophils Relative: 1 %
Eosinophils Absolute: 0.4 10*3/uL (ref 0.0–0.5)
Eosinophils Relative: 4 %
HCT: 44.2 % (ref 39.0–52.0)
Hemoglobin: 15.2 g/dL (ref 13.0–17.0)
Immature Granulocytes: 0 %
Lymphocytes Relative: 16 %
Lymphs Abs: 1.3 10*3/uL (ref 0.7–4.0)
MCH: 34.3 pg — ABNORMAL HIGH (ref 26.0–34.0)
MCHC: 34.4 g/dL (ref 30.0–36.0)
MCV: 99.8 fL (ref 80.0–100.0)
Monocytes Absolute: 1.1 10*3/uL — ABNORMAL HIGH (ref 0.1–1.0)
Monocytes Relative: 12 %
Neutro Abs: 5.7 10*3/uL (ref 1.7–7.7)
Neutrophils Relative %: 67 %
Platelets: 179 10*3/uL (ref 150–400)
RBC: 4.43 MIL/uL (ref 4.22–5.81)
RDW: 11.7 % (ref 11.5–15.5)
WBC: 8.5 10*3/uL (ref 4.0–10.5)
nRBC: 0 % (ref 0.0–0.2)

## 2018-08-06 MED ORDER — THIAMINE HCL 100 MG PO TABS: 100.0000 mg | ORAL_TABLET | Freq: Every day | ORAL | 0 refills | Status: DC

## 2018-08-06 MED ORDER — PNEUMOCOCCAL VAC POLYVALENT 25 MCG/0.5ML IJ INJ: 0.5000 mL | INJECTION | INTRAMUSCULAR | 0 refills | Status: AC

## 2018-08-06 MED ORDER — FOLIC ACID 1 MG PO TABS: 1.0000 mg | ORAL_TABLET | Freq: Every day | ORAL | 0 refills | Status: DC

## 2018-08-06 MED ORDER — INFLUENZA VAC SPLIT QUAD 0.5 ML IM SUSY: 0.5000 mL | PREFILLED_SYRINGE | INTRAMUSCULAR | 0 refills | Status: AC

## 2018-08-06 NOTE — Progress Notes (Signed)
Physical Therapy Treatment Patient Details Name: Leroy Bell MRN: [id number] DOB: May 02, 1960 Today's Date: 08/06/2018    History of Present Illness Pt is a 58 y/o male admitted for confusion, visual hallucinations, tremor and diagnosed with delirium.  Patient also noted subjective fevers and dry cough. Covid-19 negative. Pt admitted for ETOH withdrawal.     PT Comments    Pt making steady progress with functional mobility. Pt with no LOB or need for physical assistance throughout, min guard overall with use of RW. Pt would continue to benefit from skilled physical therapy services at this time while admitted and after d/c to address the below listed limitations in order to improve overall safety and independence with functional mobility.    Follow Up Recommendations  Home health PT;Supervision/Assistance - 24 hour     Equipment Recommendations  Rolling walker with 5" wheels;3in1 (PT)    Recommendations for Other Services       Precautions / Restrictions Precautions Precautions: Fall Restrictions Weight Bearing Restrictions: No    Mobility  Bed Mobility Overal bed mobility: Needs Assistance Bed Mobility: Supine to Sit;Sit to Supine     Supine to sit: Supervision Sit to supine: Supervision   General bed mobility comments: supervision for safety  Transfers Overall transfer level: Needs assistance Equipment used: Rolling walker (2 wheeled) Transfers: Sit to/from Stand Sit to Stand: Min guard         General transfer comment: cueing for safe hand placement, min guard for transition into standing from EOB  Ambulation/Gait Ambulation/Gait assistance: Min guard Gait Distance (Feet): 200 Feet Assistive device: Rolling walker (2 wheeled) Gait Pattern/deviations: Step-through pattern;Decreased stride length;Trunk flexed Gait velocity: Decreased   General Gait Details: pt with mild instability but no overt LOB or need for physical assistance, min guard for safety  and cueing to maintain proximity to [employer] Mobility    Modified Rankin (Stroke Patients Only)       Balance Overall balance assessment: Needs assistance Sitting-balance support: Feet supported;No upper extremity supported Sitting balance-[name]: Good     Standing balance support: Bilateral upper extremity supported;During functional activity Standing balance-[name]: Poor                              Cognition Arousal/Alertness: Awake/alert Behavior During Therapy: Flat affect Overall Cognitive Status: Impaired/Different from baseline Area of Impairment: Attention;Memory;Following commands;Safety/judgement;Awareness;Problem solving                   Current Attention Level: Sustained Memory: Decreased short-term memory Following Commands: Follows one step commands consistently;Follows one step commands with increased time;Follows multi-step commands inconsistently Safety/Judgement: Decreased awareness of safety;Decreased awareness of deficits Awareness: Intellectual Problem Solving: Slow processing;Decreased initiation;Difficulty sequencing;Requires verbal cues        Exercises      General Comments        Pertinent Vitals/Pain Pain Assessment: No/denies pain    Home Living                      Prior Function            PT Goals (current goals can now be found in the care plan section) Acute Rehab PT Goals PT Goal Formulation: With patient Time For Goal Achievement: 08/09/18 Potential to Achieve Goals: Good Progress towards PT goals: Progressing toward goals    Frequency  Min 3X/week      PT Plan Discharge plan needs to be updated    Co-evaluation              AM-PAC PT "6 Clicks" Mobility   Outcome Measure  Help needed turning from your back to your side while in a flat bed without using bedrails?: None Help needed moving from lying on your back to sitting on  the side of a flat bed without using bedrails?: None Help needed moving to and from a bed to a chair (including a wheelchair)?: A Little Help needed standing up from a chair using your arms (e.g., wheelchair or bedside chair)?: A Little Help needed to walk in hospital room?: A Little Help needed climbing 3-5 steps with a railing? : A Lot 6 Click Score: 19    End of Session Equipment Utilized During Treatment: Gait belt Activity Tolerance: Patient tolerated treatment well Patient left: in bed;with call bell/phone within reach;with bed alarm set Nurse Communication: Mobility status PT Visit Diagnosis: Unsteadiness on feet (R26.81);Difficulty in walking, not elsewhere classified (R26.2)     Time: [id number] PT Time Calculation (min) (ACUTE ONLY): 22 min  Charges:  $Gait Training: 8-22 mins                     Deborah ChalkJennifer Arielle Eber, [place]PT, DPT  Acute Rehabilitation Services Pager [phone] Office [phone]     Alessandra BevelsJennifer M Shemar Plemmons 08/06/2018, 11:47 AM

## 2018-08-06 NOTE — Plan of Care (Signed)
  Problem: Clinical Measurements: Goal: Ability to maintain clinical measurements within normal limits will improve Outcome: Progressing   Problem: Clinical Measurements: Goal: Will remain free from infection Outcome: Progressing   Problem: Clinical Measurements: Goal: Diagnostic test results will improve Outcome: Progressing   Problem: Clinical Measurements: Goal: Respiratory complications will improve Outcome: Progressing   Problem: Education: Goal: Knowledge of General Education information will improve Description Including pain rating scale, medication(s)/side effects and non-pharmacologic comfort measures Outcome: Progressing   Problem: Health Behavior/Discharge Planning: Goal: Ability to manage health-related needs will improve Outcome: Progressing

## 2018-08-06 NOTE — Progress Notes (Signed)
Family Medicine Teaching Service Daily Progress Note Intern Pager: [phone]  Patient name: RAJVIR BYRDSONG Medical record number: [id number] Date of birth: [birth year] Age: 58 y.o. Gender: male  Primary Care Provider: Patient, No Pcp Per Consultants: SW, dietitian  Code Status: Full Code   Pt Overview and Major Events to Date:  Admitted: 07/28/2018 for CC: Hallucinations and Alcohol Problem  Hospital Day: 10   Assessment and Plan: DAROL COLA is a 58 y.o. male admitted for delirium tremens.  His chronic conditions include alcohol use disorder  AMS likely secondary to delirium, improving Patient continue to be confused. MRI was negative. TSH. RPR and HIV were also negative. AMS likely secondary to delirium. Possible sedating meds lingering due to poor clearance given alcoholic liver disease. --Continue to monitor mental status --Sitter for reorientation  --Delirium precautions --Avoiding sedating medications  Acute alcohol withdrawal, stable, resolved CIWAs 0>0>0  . Has not needed any prn ativan in in the past 5 days. >15 days since last drink. -Continue CIWA protocol.  -Continue thiamine, continue MVI, continue folic acid  Mild transaminitis, improving LFTs and Tbili continue to improve throughout hospitalization. Elevated likely due to alcohol use. -Will need hep B booster after discharge  -Will need outpatient GI follow up  Hyperlipidemia  -Follow up outpatient, likely needs statin  FEN/GI: regular diet   PPx:  Lovenox Q24H  Disposition: Home with HHPT/OT pending AMS improvement.   Subjective:  Alert and oriented x3 this am. Much more appropriate and conversant. No acute complaints.   Objective: BP (!) 164/94 (BP Location: Left Arm)   Pulse 90   Temp 98.3 F (36.8 C) (Oral)   Resp 18   Ht 5\' 9"  (1.753 m)   Wt 74.2 kg   SpO2 96%   BMI 24.16 kg/m  Intake/Output      04/10 0701 - 04/11 0700 04/11 0701 - 04/12 0700   P.O. 200    Total Intake(mL/kg) 200  (2.7)    Urine (mL/kg/hr) 400 (0.2)    Stool     Total Output 400    Net -200          Physical Exam: General: laying in bed, in NAD Cardiovascular: RRR  Respiratory: CTAB, NWOB on RA Abdomen: soft, nontender, nondistended, + bowel sounds Extremities: WWP, no LE edema Neuro: alert. Oriented x3 . Slowed thought process and speech. CN II-XII intact. No pronator drift  Laboratory:  CBC: Recent Labs  Lab 08/03/18 0919 08/06/18 0345  WBC 6.2 8.5  NEUTROABS  --  5.7  HGB 14.1 15.2  HCT 42.3 44.2  MCV 101.9* 99.8  PLT 133* 179   Basic Metabolic Panel: Recent Labs  Lab 08/01/18 0725 08/03/18 0919 08/04/18 0452 08/06/18 0345  NA 137 136  --  137  K 3.9 3.5  --  3.8  CL 105 101  --  102  CO2 24 25  --  24  GLUCOSE 95 108*  --  124*  BUN 10 9  --  9  CREATININE 0.84 0.84 0.94 0.86  CALCIUM 9.6 10.2  --  10.9*   GFR: Estimated Creatinine Clearance: 97.1 mL/min (by C-G formula based on SCr of 0.86 mg/dL).   Liver Function Tests: Recent Labs  Lab 07/30/18 1117 08/01/18 0725 08/02/18 0940 08/03/18 0919 08/06/18 0345  AST  --  59* 61* 59* 62*  ALT  --  29 31 32 37  ALKPHOS  --  138* 128* 145* 132*  BILITOT 4.5* 3.3* 3.5* 2.8* 2.4*  PROT  --  7.2 8.2* 8.0 7.5  ALBUMIN  --  3.4* 3.5 3.7 3.5     Imaging/Diagnostic Tests: Mr Brain Wo Contrast  Result Date: 08/02/2018 CLINICAL DATA:  Altered mental status EXAM: MRI HEAD WITHOUT CONTRAST TECHNIQUE: Multiplanar, multiecho pulse sequences of the brain and surrounding structures were obtained without intravenous contrast. COMPARISON:  None. FINDINGS: Brain: Mild atrophy without hydrocephalus. Negative for acute infarct. No significant chronic ischemic change. Negative for hemorrhage mass or edema. No midline shift. Vascular: Normal arterial flow voids Skull and upper cervical spine: Negative Sinuses/Orbits: Minimal mucosal edema paranasal sinuses. Normal orbit Other: None IMPRESSION: Mild atrophy.  No acute abnormality.  Electronically Signed   By: Marlan Palauharles  Clark M.D.   On: 08/02/2018 16:55    Lovena Neighboursiallo, Tyresa Prindiville, MD 08/06/2018, 8:57 AM PGY-3, [facility] FPTS Intern pager: [phone], text pages welcome

## 2018-08-06 NOTE — Progress Notes (Signed)
Education given to wife beth Mckercher via telephone. Discharge packet given & taken to care with pt who was being picked up by his son. All belongings besides cellphone sent home w pt. No cell phone was seen in room & wife aware. Sanda Linger, RN

## 2018-08-08 ENCOUNTER — Inpatient Hospital Stay: Payer: Managed Care, Other (non HMO) | Admitting: Internal Medicine

## 2018-08-22 NOTE — Progress Notes (Signed)
Virtual Visit via Telephone Note  I connected with Leroy Bell on 08/23/18 at  2:30 PM EDT by telephone and verified that I am speaking with the correct person using two identifiers.   I discussed the limitations, risks, security and privacy concerns of performing an evaluation and management service by telephone and the availability of in person appointments.The staff discussed with the patient that there may be a patient responsible charge related to this service. The patient expressed understanding and agreed to proceed.  Location of Patient- Home Location of Provider- In Clinic   History of Present Illness: Leroy Bell calls in today to establish as a new pt. He is a pleasant 58 year old male. PMH: Alcoholism with recent hospitalization 07/28/2018-08/06/2018 for AMS secondary to acute alcohol withdrawal He reports not consuming any beer (Coors Lite) since 07/26/2018 He reports heavy drinking (10-15 Coors Lite/day) on/off since the death of his [age] father (around 2010/2011). Per pt- his father was a poorly controlled diabetic who was undergoing surgery for diabetic "foot problems" and suffered MI during hospitalization. He reports a very close relationship with his father. He reports not drinking for 2 years prior to resuming heavy ETOH use this "go round", however he is unable to tell me how many months/weeks he was drinking prior to hospitalization. He states "I don't need any help staying away from beer". He ir married with two children ages 5121 and 3525- reports strong support system. He reports never needing "professional help" with ETOH abuse He owns a trucking company, he was evasive when asked if he drives, rather stated "I run things". He reports using "chew", no other forms of tobacco He has been taking Folic Acid and Thiamine from hospital, he has not had the labs or the ordered home PT/OT He has not established with Behavioral Health He has not established with GI-likely alcoholic  liver disease. Reviewed labs and imaging during admission with pt  Review of Systems: General:   No F/C, wt loss Pulm:   No DIB, SOB, pleuritic chest pain Card:  No CP, palpitations Abd:  No n/v/d or pain Ext:  No inc edema from baseline This patient does not have sx concerning for COVID-19 Infection (ie; fever, chills, cough, new or worsening shortness of breath).  Patient Care Team    Relationship Specialty Notifications Start End  Julaine Fusianford, Katy D, NP PCP - General Family Medicine  08/23/18     Patient Active Problem List   Diagnosis Date Noted  . Transaminitis   . Altered mental status   . Elevated bilirubin   . Essential hypertension   . Viral URI with cough   . Sinus tachycardia   . Elevated blood-pressure reading without diagnosis of hypertension   . Alcohol withdrawal ([name]) 07/28/2018     Past Medical History:  Diagnosis Date  . Alcohol abuse      Past Surgical History:  Procedure Laterality Date  . NECK SURGERY       Family History  Problem Relation Age of Onset  . Diabetes Mother   . Hypertension Mother   . Diabetes Father   . Hypertension Father   . Heart attack Father   . Cancer Father        prostate  . Diabetes Sister   . Healthy Daughter   . Healthy Son   . Diabetes Paternal Grandmother   . Diabetes Paternal Grandfather      Social History   Substance and Sexual Activity  Drug Use Not Currently  Social History   Substance and Sexual Activity  Alcohol Use Not Currently   Comment: stopped on 07/24/18     Social History   Tobacco Use  Smoking Status Never Smoker  Smokeless Tobacco Current User  . Types: Snuff     Outpatient Encounter Medications as of 08/23/2018  Medication Sig  . folic acid (FOLVITE) 1 MG tablet Take 1 tablet (1 mg total) by mouth daily.  Marland Kitchen thiamine 100 MG tablet Take 1 tablet (100 mg total) by mouth daily.  . [DISCONTINUED] folic acid (FOLVITE) 1 MG tablet Take 1 tablet (1 mg total) by mouth daily.  .  [DISCONTINUED] thiamine 100 MG tablet Take 1 tablet (100 mg total) by mouth daily.   No facility-administered encounter medications on file as of 08/23/2018.     Allergies: Penicillins  There is no height or weight on file to calculate BMI.  Blood pressure 119/63, pulse 76.  Observations/Objective: No acute distress noted during telephone conversation  Assessment and Plan: Continue vitamin supplements Remain off [employer] Lite Referral to GI and Behavioral Health Need labs COVID-19 Education: Signs and symptoms of COVID-19 infection were discussed with pt and how to seek care for testing.  The importance of following the Stay at Home order, and when out- Social Distancing and wearing a facial mask were discussed today.  Follow Up Instructions: Fasting labs next week OV 3 months   I discussed the assessment and treatment plan with the patient. The patient was provided an opportunity to ask questions and all were answered. The patient agreed with the plan and demonstrated an understanding of the instructions.   The patient was advised to call back or seek an in-person evaluation if the symptoms worsen or if the condition fails to improve as anticipated.  I provided 35 minutes of non-face-to-face time during this encounter.   Julaine Fusi, NP

## 2018-08-23 ENCOUNTER — Other Ambulatory Visit: Payer: Self-pay

## 2018-08-23 ENCOUNTER — Ambulatory Visit (INDEPENDENT_AMBULATORY_CARE_PROVIDER_SITE_OTHER): Payer: Managed Care, Other (non HMO) | Admitting: Adult Health

## 2018-08-23 ENCOUNTER — Encounter: Payer: Self-pay | Admitting: Adult Health

## 2018-08-23 VITALS — BP 119/63 | HR 76

## 2018-08-23 DIAGNOSIS — R7989 Other specified abnormal findings of blood chemistry: Secondary | ICD-10-CM

## 2018-08-23 DIAGNOSIS — Z1211 Encounter for screening for malignant neoplasm of colon: Secondary | ICD-10-CM

## 2018-08-23 DIAGNOSIS — R17 Unspecified jaundice: Secondary | ICD-10-CM | POA: Diagnosis not present

## 2018-08-23 DIAGNOSIS — Z0000 Encounter for general adult medical examination without abnormal findings: Secondary | ICD-10-CM

## 2018-08-23 DIAGNOSIS — F1010 Alcohol abuse, uncomplicated: Secondary | ICD-10-CM | POA: Diagnosis not present

## 2018-08-23 DIAGNOSIS — Z833 Family history of diabetes mellitus: Secondary | ICD-10-CM | POA: Diagnosis not present

## 2018-08-23 DIAGNOSIS — R945 Abnormal results of liver function studies: Secondary | ICD-10-CM

## 2018-08-23 MED ORDER — THIAMINE HCL 100 MG PO TABS: 100.0000 mg | ORAL_TABLET | Freq: Every day | ORAL | 0 refills | Status: DC

## 2018-08-23 MED ORDER — FOLIC ACID 1 MG PO TABS: 1.0000 mg | ORAL_TABLET | Freq: Every day | ORAL | 0 refills | Status: DC

## 2018-08-23 NOTE — Assessment & Plan Note (Signed)
Assessment and Plan: Continue vitamin supplements Remain off [employer] Lite Referral to GI and Behavioral Health Need labs COVID-19 Education: Signs and symptoms of COVID-19 infection were discussed with pt and how to seek care for testing.  The importance of following the Stay at Home order, and when out- Social Distancing and wearing a facial mask were discussed today.  Follow Up Instructions: Fasting labs next week OV 3 months   I discussed the assessment and treatment plan with the patient. The patient was provided an opportunity to ask questions and all were answered. The patient agreed with the plan and demonstrated an understanding of the instructions.   The patient was advised to call back or seek an in-person evaluation if the symptoms worsen or if the condition fails to improve as anticipated.

## 2018-08-23 NOTE — Assessment & Plan Note (Signed)
BH referral placed

## 2018-08-23 NOTE — Assessment & Plan Note (Signed)
Referral to GI placed

## 2018-08-28 ENCOUNTER — Other Ambulatory Visit: Payer: Self-pay | Admitting: Family Medicine

## 2018-08-30 ENCOUNTER — Other Ambulatory Visit: Payer: Self-pay

## 2018-08-30 ENCOUNTER — Other Ambulatory Visit (INDEPENDENT_AMBULATORY_CARE_PROVIDER_SITE_OTHER): Payer: Managed Care, Other (non HMO)

## 2018-08-30 DIAGNOSIS — F1010 Alcohol abuse, uncomplicated: Secondary | ICD-10-CM

## 2018-08-30 DIAGNOSIS — Z833 Family history of diabetes mellitus: Secondary | ICD-10-CM

## 2018-08-30 DIAGNOSIS — R17 Unspecified jaundice: Secondary | ICD-10-CM

## 2018-08-31 LAB — COMPREHENSIVE METABOLIC PANEL
ALT: 25 IU/L (ref 0–44)
AST: 44 IU/L — ABNORMAL HIGH (ref 0–40)
Albumin/Globulin Ratio: 1.1 — ABNORMAL LOW (ref 1.2–2.2)
Albumin: 3.9 g/dL (ref 3.8–4.9)
Alkaline Phosphatase: 221 IU/L — ABNORMAL HIGH (ref 39–117)
BUN/Creatinine Ratio: 5 — ABNORMAL LOW (ref 9–20)
BUN: 4 mg/dL — ABNORMAL LOW (ref 6–24)
Bilirubin Total: 1.6 mg/dL — ABNORMAL HIGH (ref 0.0–1.2)
CO2: 23 mmol/L (ref 20–29)
Calcium: 9.3 mg/dL (ref 8.7–10.2)
Chloride: 102 mmol/L (ref 96–106)
Creatinine, Ser: 0.82 mg/dL (ref 0.76–1.27)
GFR calc Af Amer: 115 mL/min/{1.73_m2} (ref >59)
GFR calc non Af Amer: 100 mL/min/{1.73_m2} (ref >59)
Globulin, Total: 3.4 g/dL (ref 1.5–4.5)
Glucose: 146 mg/dL — ABNORMAL HIGH (ref 65–99)
Potassium: 4.2 mmol/L (ref 3.5–5.2)
Sodium: 138 mmol/L (ref 134–144)
Total Protein: 7.3 g/dL (ref 6.0–8.5)

## 2018-08-31 LAB — LIPID PANEL
Chol/HDL Ratio: 3.5 ratio (ref 0.0–5.0)
Cholesterol, Total: 184 mg/dL (ref 100–199)
HDL: 52 mg/dL (ref >39)
LDL Calculated: 113 mg/dL — ABNORMAL HIGH (ref 0–99)
Triglycerides: 95 mg/dL (ref 0–149)
VLDL Cholesterol Cal: 19 mg/dL (ref 5–40)

## 2018-08-31 LAB — HEMOGLOBIN A1C
Est. average glucose Bld gHb Est-mCnc: 146 mg/dL
Hgb A1c MFr Bld: 6.7 % — ABNORMAL HIGH (ref 4.8–5.6)

## 2018-08-31 NOTE — Progress Notes (Signed)
Virtual Visit via Telephone Note  I connected with Leroy Bell on 09/01/2018 at  1:45 PM EDT by telephone and verified that I am speaking with the correct person using two identifiers.  Location: Patient: Home Provider: In Clinic   I discussed the limitations, risks, security and privacy concerns of performing an evaluation and management service by telephone and the availability of in person appointments. I also discussed with the patient that there may be a patient responsible charge related to this service. The patient expressed understanding and agreed to proceed.   History of Present Illness: [name] calls in today to review recent lab results: CMP: Alk Phos- 221 AST- 44 ALT- 24 Last heavy use of ETOH 1 April, prior to hospital admission He reports having one beer since hospital d/c 08/06/2018 He denies using Acetaminophen He denies RUQ pain He denies change in bowel habits, reports well, formed BM once day or once every other day. He was able to provide more detail of his ETOH use today- He states "I was drinking heavily this time for the last two years with only a week off here or there". He has not established with Behavioral Health- referral placed, 08/23/2018 Discussed utilizing AA or looking for Zoom AA meetings He reports strong support system of family or friends  A1c- new onset diabetes 6.7 His mother, father, and sister all have/had T2D Discussed how to start Metformin, how to reduce CHO/sugar in diet  The 10-year ASCVD risk score Leroy Bell DC Leroy Bell., et al., 2013) is: 10.9%   Values used to calculate the score:     Age: 29 years     Sex: Male     Is Non-Hispanic African American: No     Diabetic: Yes     Tobacco smoker: No     Systolic Blood Pressure: 161 mmHg     Is BP treated: No     HDL Cholesterol: 52 mg/dL     Total Cholesterol: 184 mg/dL  LDL-113   Patient Care Team    Relationship Specialty Notifications Start End  Leroy Grandchild, NP PCP -  General Family Medicine  08/23/18     Patient Active Problem List   Diagnosis Date Noted  . Alcohol abuse 08/23/2018  . Healthcare maintenance 08/23/2018  . Elevated LFTs 08/23/2018  . Transaminitis   . Altered mental status   . Elevated bilirubin   . Essential hypertension   . Viral URI with cough   . Sinus tachycardia   . Elevated blood-pressure reading without diagnosis of hypertension   . Alcohol withdrawal ([place]) 07/28/2018     Past Medical History:  Diagnosis Date  . Alcohol abuse      Past Surgical History:  Procedure Laterality Date  . NECK SURGERY       Family History  Problem Relation Age of Onset  . Diabetes Mother   . Hypertension Mother   . Diabetes Father   . Hypertension Father   . Heart attack Father   . Cancer Father        prostate  . Diabetes Sister   . Healthy Daughter   . Healthy Son   . Diabetes Paternal Grandmother   . Diabetes Paternal Grandfather      Social History   Substance and Sexual Activity  Drug Use Not Currently     Social History   Substance and Sexual Activity  Alcohol Use Not Currently   Comment: stopped on 07/24/18     Social History  Tobacco Use  Smoking Status Never Smoker  Smokeless Tobacco Current User  . Types: Snuff     Outpatient Encounter Medications as of 09/01/2018  Medication Sig  . folic acid (FOLVITE) 1 MG tablet Take 1 tablet (1 mg total) by mouth daily.  Marland Kitchen thiamine 100 MG tablet Take 1 tablet (100 mg total) by mouth daily.  . metFORMIN (GLUCOPHAGE) 500 MG tablet 1/2 tablet with dinner for two weeks, then increase to one full tablet with dinner   No facility-administered encounter medications on file as of 09/01/2018.     Allergies: Penicillins  There is no height or weight on file to calculate BMI.  Blood pressure (!) 141/83.   Review of Systems: General:   Denies fever, chills, unexplained weight loss.  Optho/Auditory:   Denies visual changes, blurred vision/LOV Respiratory:    Denies SOB, DOE more than baseline levels.  Cardiovascular:   Denies chest pain, palpitations, new onset peripheral edema  Gastrointestinal:   Denies nausea, vomiting, diarrhea.  Genitourinary: Denies dysuria, freq/ urgency, flank pain or discharge from genitals.  Endocrine:     Denies hot or cold intolerance, polyuria, polydipsia. Musculoskeletal:   Denies unexplained myalgias, joint swelling, unexplained arthralgias, gait problems.  Skin:  Denies rash, suspicious lesions Neurological:     Denies dizziness, unexplained weakness, numbness  Psychiatric/Behavioral:   Denies mood changes, suicidal or homicidal ideations, hallucinations This patient does not have sx concerning for COVID-19 Infection (ie; fever, chills, cough, new or worsening shortness of breath).  Observations/Objective: No acute distress noted during telephone conversation  Assessment and Plan: 1) New Onset of T2D- Lab Results  Component Value Date   HGBA1C 6.7 (H) 08/30/2018   HGBA1C 5.7 (H) 07/29/2018  Metformin 575m 1/2 tab with dinner for two weeks, then increase to one tab with dinner Diabetic diet Increase exercise as tolerate Call [employer] and inquire which glucometer is covered, then call clinic and provide info so that we can send in correct order Check AM BG every morning and several times in afternoon/week 2) Referral to GI placed, re: abnormal LFTs 3) [facility] Continue to abstain from [facility]H  Follow Up Instructions: Has f/u in July 2020   I discussed the assessment and treatment plan with the patient. The patient was provided an opportunity to ask questions and all were answered. The patient agreed with the plan and demonstrated an understanding of the instructions.   The patient was advised to call back or seek an in-person evaluation if the symptoms worsen or if the condition fails to improve as anticipated.  I provided 35 minutes of non-face-to-face time  during this encounter.   KEsaw Grandchild NP

## 2018-09-01 ENCOUNTER — Other Ambulatory Visit: Payer: Self-pay

## 2018-09-01 ENCOUNTER — Ambulatory Visit (INDEPENDENT_AMBULATORY_CARE_PROVIDER_SITE_OTHER): Payer: Managed Care, Other (non HMO) | Admitting: Adult Health

## 2018-09-01 ENCOUNTER — Encounter: Payer: Self-pay | Admitting: Adult Health

## 2018-09-01 VITALS — BP 141/83

## 2018-09-01 DIAGNOSIS — F1010 Alcohol abuse, uncomplicated: Secondary | ICD-10-CM | POA: Diagnosis not present

## 2018-09-01 DIAGNOSIS — E119 Type 2 diabetes mellitus without complications: Secondary | ICD-10-CM | POA: Diagnosis not present

## 2018-09-01 DIAGNOSIS — R748 Abnormal levels of other serum enzymes: Secondary | ICD-10-CM | POA: Diagnosis not present

## 2018-09-01 MED ORDER — METFORMIN HCL 500 MG PO TABS: ORAL_TABLET | ORAL | 0 refills | Status: DC

## 2018-09-01 NOTE — Assessment & Plan Note (Signed)
  Assessment and Plan: 1) New Onset of T2D- Lab Results  Component Value Date   HGBA1C 6.7 (H) 08/30/2018   HGBA1C 5.7 (H) 07/29/2018  Metformin 500mg 1/2 tab with dinner for two weeks, then increase to one tab with dinner Diabetic diet Increase exercise as tolerate Call [employer] and inquire which glucometer is covered, then call clinic and provide info so that we can send in correct order Check AM BG every morning and several times in afternoon/week 2) Referral to GI placed, re: abnormal LFTs 3) Alcoholism- referral placed to [facility] Recommend local AA Continue to abstain from ETOH  Follow Up Instructions: Has f/u in July 2020   I discussed the assessment and treatment plan with the patient. The patient was provided an opportunity to ask questions and all were answered. The patient agreed with the plan and demonstrated an understanding of the instructions.   The patient was advised to call back or seek an in-person evaluation if the symptoms worsen or if the condition fails to improve as anticipated. 

## 2018-09-01 NOTE — Assessment & Plan Note (Signed)
  Assessment and Plan: 1) New Onset of T2D- Lab Results  Component Value Date   HGBA1C 6.7 (H) 08/30/2018   HGBA1C 5.7 (H) 07/29/2018  Metformin 500mg  1/2 tab with dinner for two weeks, then increase to one tab with dinner Diabetic diet Increase exercise as tolerate Call [employer] and inquire which glucometer is covered, then call clinic and provide info so that we can send in correct order Check AM BG every morning and several times in afternoon/week 2) Referral to GI placed, re: abnormal LFTs 3) Alcoholism- referral placed to [facility] Recommend local AA Continue to abstain from ETOH  Follow Up Instructions: Has f/u in July 2020   I discussed the assessment and treatment plan with the patient. The patient was provided an opportunity to ask questions and all were answered. The patient agreed with the plan and demonstrated an understanding of the instructions.   The patient was advised to call back or seek an in-person evaluation if the symptoms worsen or if the condition fails to improve as anticipated.

## 2018-09-06 ENCOUNTER — Other Ambulatory Visit: Payer: Self-pay

## 2018-09-06 ENCOUNTER — Encounter: Payer: Self-pay | Admitting: Gastroenterology

## 2018-09-06 ENCOUNTER — Ambulatory Visit (INDEPENDENT_AMBULATORY_CARE_PROVIDER_SITE_OTHER): Payer: Managed Care, Other (non HMO) | Admitting: Gastroenterology

## 2018-09-06 VITALS — Ht 67.0 in | Wt 172.0 lb

## 2018-09-06 DIAGNOSIS — R7401 Elevation of levels of liver transaminase levels: Secondary | ICD-10-CM

## 2018-09-06 DIAGNOSIS — R7989 Other specified abnormal findings of blood chemistry: Secondary | ICD-10-CM

## 2018-09-06 DIAGNOSIS — R945 Abnormal results of liver function studies: Secondary | ICD-10-CM | POA: Diagnosis not present

## 2018-09-06 DIAGNOSIS — F1011 Alcohol abuse, in remission: Secondary | ICD-10-CM

## 2018-09-06 DIAGNOSIS — R740 Nonspecific elevation of levels of transaminase and lactic acid dehydrogenase [LDH]: Secondary | ICD-10-CM

## 2018-09-06 NOTE — Addendum Note (Signed)
Addended by: Alexis Frock on: 09/06/2018 03:37 PM   Modules accepted: Orders

## 2018-09-06 NOTE — Progress Notes (Addendum)
This patient contacted our office requesting a physician telemedicine consultation regarding clinical questions and/or test results.  If new patient, they were referred by Mina Marble, NP  Participants on the conference : myself and patient   The patient consented to this consultation and was aware that a charge will be placed through their insurance.  I was in my office and the patient was at home   Encounter time:  Total time 35 minutes, with 22 minutes spent with patient on [employer]   _____________________________________________________________________________________________              [name] Consult Note:  History: Leroy Bell 09/06/2018  Referring physician: Esaw Grandchild, NP  Reason for consult/chief complaint: No chief complaint on file. Elevated LFTs  Subjective  HPI:  I reviewed a discharge summary from last month with the patient was admitted with alcohol withdrawal. Reviewed most recent primary care follow-up note from 09/01/2018 noting patient had become almost completely abstinent since hospital discharge, was referred to behavioral health, noted to have new onset diabetes with hemoglobin A1c 6.7.  Leroy Bell says he is generally feeling well since recent hospital discharge.  He has stopped drinking alcohol altogether, and taking his metformin daily.  He felt fatigued for the first several days of taking it, but that seems to have improved.  He is not having diarrhea or rectal bleeding.   ROS:  Review of Systems  Constitutional: Negative for appetite change and unexpected weight change.  HENT: Negative for mouth sores and voice change.   Eyes: Negative for pain and redness.  Respiratory: Negative for cough and shortness of breath.   Cardiovascular: Negative for chest pain and palpitations.  Genitourinary: Negative for dysuria and hematuria.  Musculoskeletal: Negative for arthralgias and myalgias.  Skin: Negative for pallor  and rash.  Neurological: Negative for weakness and headaches.  Hematological: Negative for adenopathy.     Past Medical History: Past Medical History:  Diagnosis Date  . Alcohol abuse   . Diabetes ([place])    Sleep apnea, uses CPAP intermittently  He had not been seen primary care regularly for many years until recently establishing care after the hospital stay.  Past Surgical History: Past Surgical History:  Procedure Laterality Date  . NO PAST SURGERIES       Family History: Family History  Problem Relation Age of Onset  . Diabetes Mother   . Hypertension Mother   . Diabetes Father   . Hypertension Father   . Heart attack Father   . Prostate cancer Father   . Diabetes Sister   . Healthy Daughter   . Healthy Son   . Diabetes Paternal Grandmother   . Diabetes Paternal Grandfather   . Cancer Paternal Grandfather        unknown    Two uncles with cirrhosis and alcohol use  Social History: Social History   Socioeconomic History  . Marital status: Married    Spouse name: Not on file  . Number of children: Not on file  . Years of education: Not on file  . Highest education level: Not on file  Occupational History  . Not on file  Social Needs  . Financial resource strain: Not on file  . Food insecurity:    Worry: Not on file    Inability: Not on file  . Transportation needs:    Medical: Not on file    Non-medical: Not on file  Tobacco Use  . Smoking status: Never Smoker  . Smokeless  tobacco: Current User    Types: Snuff  Substance and Sexual Activity  . Alcohol use: Not Currently    Comment: stopped on 07/24/18  . Drug use: Not Currently  . Sexual activity: Yes    Birth control/protection: None  Lifestyle  . Physical activity:    Days per week: Not on file    Minutes per session: Not on file  . Stress: Not on file  Relationships  . Social connections:    Talks on phone: Not on file    Gets together: Not on file    Attends religious service: Not on  file    Active member of club or organization: Not on file    Attends meetings of clubs or organizations: Not on file    Relationship status: Not on file  Other Topics Concern  . Not on file  Social History Narrative  . Not on file   Owns a trucking company  Allergies: Allergies  Allergen Reactions  . Penicillins Hives    Outpatient Meds: Current Outpatient Medications  Medication Sig Dispense Refill  . folic acid (FOLVITE) 1 MG tablet Take 1 tablet (1 mg total) by mouth daily. 30 tablet 0  . metFORMIN (GLUCOPHAGE) 500 MG tablet 1/2 tablet with dinner for two weeks, then increase to one full tablet with dinner 90 tablet 0  . thiamine 100 MG tablet Take 1 tablet (100 mg total) by mouth daily. 30 tablet 0   No current facility-administered medications for this visit.       ___________________________________________________________________ Objective     No exam -virtual visit  Labs:  CMP Latest Ref Rng & Units 08/30/2018 08/06/2018 08/04/2018  Glucose 65 - 99 mg/dL 146(H) 124(H) -  BUN 6 - 24 mg/dL 4(L) 9 -  Creatinine 0.76 - 1.27 mg/dL 0.82 0.86 0.94  Sodium 134 - 144 mmol/L 138 137 -  Potassium 3.5 - 5.2 mmol/L 4.2 3.8 -  Chloride 96 - 106 mmol/L 102 102 -  CO2 20 - 29 mmol/L 23 24 -  Calcium 8.7 - 10.2 mg/dL 9.3 10.9(H) -  Total Protein 6.0 - 8.5 g/dL 7.3 7.5 -  Total Bilirubin 0.0 - 1.2 mg/dL 1.6(H) 2.4(H) -  Alkaline Phos 39 - 117 IU/L 221(H) 132(H) -  AST 0 - 40 IU/L 44(H) 62(H) -  ALT 0 - 44 IU/L 25 37 -  T bili 1.6 down from 2.4  CBC Latest Ref Rng & Units 08/06/2018 08/03/2018 07/30/2018  WBC 4.0 - 10.5 K/uL 8.5 6.2 5.6  Hemoglobin 13.0 - 17.0 g/dL 15.2 14.1 13.1  Hematocrit 39.0 - 52.0 % 44.2 42.3 38.7(L)  Platelets 150 - 400 K/uL 179 133(L) 115(L)   No iron studies  TC 184  TG 95  Negative HIV antibody, negative hepatitis C antibody, negative hepatitis B total core antibody and surface antibody  Radiologic Studies:  Right upper quadrant ultrasound  08/01/2018: Coarsened hepatic echotexture, no mass, no ascites, no biliary ductal dilatation, gallbladder physiologically distended without stones.  Assessment: Encounter Diagnoses  Name Primary?  Marland Kitchen LFT elevation Yes  . Transaminitis   . History of alcohol abuse     Elevated LFTs during hospital stay.  Due to alcohol use.  It is not clear why the alkaline phosphatase has recently risen with a declining bilirubin in the setting of abstinence.  Consider bony cause of alk phos elevation, but this seems less likely than liver disease in his case.  Plan:  My office will contact him to make arrangements for  the following labs drawn in about 2 weeks.: Hepatic function panel, GGT,  iron TIBC and ferritin, PT/INR, antimitochondrial antibody, anti-smooth muscle antibody.  He was encouraged to remain abstinent from alcohol, and seems motivated to do so.  He will eventually have in person follow-up with me and will need colon cancer screening at that time.  Thank you for the courtesy of this consult.  Please call me with any questions or concerns.  Nelida Meuse III  CC: Referring provider noted above

## 2018-09-06 NOTE — Patient Instructions (Signed)
If you are age 56 or older, your body mass index should be between 23-30. Your Body mass index is 26.94 kg/m. If this is out of the aforementioned range listed, please consider follow up with your Primary Care Provider.  If you are age 34 or younger, your body mass index should be between 19-25. Your Body mass index is 26.94 kg/m. If this is out of the aformentioned range listed, please consider follow up with your Primary Care Provider.   Your provider has requested that you go to the basement level for lab work. Dr Myrtie Neither would like you to have this completed the week of May 25th. The lab is open from 8 am to 4 pm. Please wear a mask into the building.  Press "B" on the elevator. The lab is located at the first door on the left as you exit the elevator.  To help prevent the possible spread of infection to our patients, communities, and staff; we will be implementing the following measures:  As of now we are not allowing any visitors/family members to accompany you to any upcoming appointments with [facility].

## 2018-09-07 ENCOUNTER — Telehealth: Payer: Self-pay | Admitting: Adult Health

## 2018-09-07 DIAGNOSIS — E119 Type 2 diabetes mellitus without complications: Secondary | ICD-10-CM

## 2018-09-07 NOTE — Telephone Encounter (Signed)
Patient was told to contact insurance to see which glucose meter was covered. He states he needs the One Touch Verio Flex sent to [facility] on [address].  Also, he needs lab work that was ordered from GI based on a referral from [place], he is hoping to avoid going all the way into [place] for this at [facility]. I advised patient that the lab orders are in the system but they were uncommon labs that I want to make sure we have the proper materials for these labs. Please advise if this is something we can do for patient.

## 2018-09-08 MED ORDER — ONETOUCH VERIO FLEX SYSTEM W/DEVICE KIT: PACK | 0 refills | Status: DC

## 2018-09-08 MED ORDER — GLUCOSE BLOOD VI STRP: ORAL_STRIP | 12 refills | Status: DC

## 2018-09-08 NOTE — Addendum Note (Signed)
Addended by: Stan Head on: 09/08/2018 08:49 AM   Modules accepted: Orders

## 2018-09-08 NOTE — Telephone Encounter (Signed)
LVM informing pt of RXs for testing supplies and to call our office to schedule an appt for labs for GI as we do have the proper testing supplies per [place] with Lennie Muckle, CMA

## 2018-09-21 ENCOUNTER — Other Ambulatory Visit: Payer: Managed Care, Other (non HMO)

## 2018-09-25 ENCOUNTER — Other Ambulatory Visit: Payer: Self-pay | Admitting: Adult Health

## 2018-09-28 ENCOUNTER — Other Ambulatory Visit: Payer: Managed Care, Other (non HMO)

## 2018-10-05 ENCOUNTER — Other Ambulatory Visit: Payer: Self-pay

## 2018-10-05 ENCOUNTER — Other Ambulatory Visit: Payer: Managed Care, Other (non HMO)

## 2018-10-05 DIAGNOSIS — R7989 Other specified abnormal findings of blood chemistry: Secondary | ICD-10-CM

## 2018-10-05 DIAGNOSIS — I10 Essential (primary) hypertension: Secondary | ICD-10-CM

## 2018-10-05 DIAGNOSIS — R748 Abnormal levels of other serum enzymes: Secondary | ICD-10-CM

## 2018-10-05 DIAGNOSIS — R17 Unspecified jaundice: Secondary | ICD-10-CM

## 2018-10-05 DIAGNOSIS — Z0000 Encounter for general adult medical examination without abnormal findings: Secondary | ICD-10-CM

## 2018-10-05 DIAGNOSIS — E119 Type 2 diabetes mellitus without complications: Secondary | ICD-10-CM

## 2018-10-06 LAB — CBC WITH DIFFERENTIAL/PLATELET
Basophils Absolute: 0 10*3/uL (ref 0.0–0.2)
Basos: 1 %
EOS (ABSOLUTE): 0.3 10*3/uL (ref 0.0–0.4)
Eos: 7 %
Hematocrit: 35.8 % — ABNORMAL LOW (ref 37.5–51.0)
Hemoglobin: 12.8 g/dL — ABNORMAL LOW (ref 13.0–17.7)
Immature Grans (Abs): 0 10*3/uL (ref 0.0–0.1)
Immature Granulocytes: 0 %
Lymphocytes Absolute: 1.1 10*3/uL (ref 0.7–3.1)
Lymphs: 26 %
MCH: 34 pg — ABNORMAL HIGH (ref 26.6–33.0)
MCHC: 35.8 g/dL — ABNORMAL HIGH (ref 31.5–35.7)
MCV: 95 fL (ref 79–97)
Monocytes Absolute: 0.4 10*3/uL (ref 0.1–0.9)
Monocytes: 10 %
Neutrophils Absolute: 2.4 10*3/uL (ref 1.4–7.0)
Neutrophils: 56 %
Platelets: 138 10*3/uL — ABNORMAL LOW (ref 150–450)
RBC: 3.77 x10E6/uL — ABNORMAL LOW (ref 4.14–5.80)
RDW: 12 % (ref 11.6–15.4)
WBC: 4.2 10*3/uL (ref 3.4–10.8)

## 2018-10-06 LAB — TSH: TSH: 4.03 u[IU]/mL (ref 0.450–4.500)

## 2018-10-06 LAB — COMPREHENSIVE METABOLIC PANEL
ALT: 24 IU/L (ref 0–44)
AST: 43 IU/L — ABNORMAL HIGH (ref 0–40)
Albumin/Globulin Ratio: 1.3 (ref 1.2–2.2)
Albumin: 3.8 g/dL (ref 3.8–4.9)
Alkaline Phosphatase: 142 IU/L — ABNORMAL HIGH (ref 39–117)
BUN/Creatinine Ratio: 7 — ABNORMAL LOW (ref 9–20)
BUN: 6 mg/dL (ref 6–24)
Bilirubin Total: 1.6 mg/dL — ABNORMAL HIGH (ref 0.0–1.2)
CO2: 23 mmol/L (ref 20–29)
Calcium: 9.7 mg/dL (ref 8.7–10.2)
Chloride: 101 mmol/L (ref 96–106)
Creatinine, Ser: 0.82 mg/dL (ref 0.76–1.27)
GFR calc Af Amer: 115 mL/min/{1.73_m2} (ref >59)
GFR calc non Af Amer: 100 mL/min/{1.73_m2} (ref >59)
Globulin, Total: 2.9 g/dL (ref 1.5–4.5)
Glucose: 254 mg/dL — ABNORMAL HIGH (ref 65–99)
Potassium: 4.8 mmol/L (ref 3.5–5.2)
Sodium: 136 mmol/L (ref 134–144)
Total Protein: 6.7 g/dL (ref 6.0–8.5)

## 2018-10-06 LAB — LIPID PANEL
Chol/HDL Ratio: 3 ratio (ref 0.0–5.0)
Cholesterol, Total: 179 mg/dL (ref 100–199)
HDL: 60 mg/dL (ref >39)
LDL Calculated: 101 mg/dL — ABNORMAL HIGH (ref 0–99)
Triglycerides: 90 mg/dL (ref 0–149)
VLDL Cholesterol Cal: 18 mg/dL (ref 5–40)

## 2018-10-06 LAB — HEMOGLOBIN A1C
Est. average glucose Bld gHb Est-mCnc: 137 mg/dL
Hgb A1c MFr Bld: 6.4 % — ABNORMAL HIGH (ref 4.8–5.6)

## 2018-10-14 LAB — ANEMIA PROFILE B
Basophils Absolute: 0 10*3/uL (ref 0.0–0.2)
Basos: 1 %
EOS (ABSOLUTE): 0.3 10*3/uL (ref 0.0–0.4)
Eos: 7 %
Ferritin: 468 ng/mL — ABNORMAL HIGH (ref 30–400)
Folate: 18.9 ng/mL (ref >3.0)
Hematocrit: 35.8 % — ABNORMAL LOW (ref 37.5–51.0)
Hemoglobin: 12.8 g/dL — ABNORMAL LOW (ref 13.0–17.7)
Immature Grans (Abs): 0 10*3/uL (ref 0.0–0.1)
Immature Granulocytes: 0 %
Iron Saturation: 67 % — ABNORMAL HIGH (ref 15–55)
Iron: 140 ug/dL (ref 38–169)
Lymphocytes Absolute: 1.1 10*3/uL (ref 0.7–3.1)
Lymphs: 26 %
MCH: 34 pg — ABNORMAL HIGH (ref 26.6–33.0)
MCHC: 35.8 g/dL — ABNORMAL HIGH (ref 31.5–35.7)
MCV: 95 fL (ref 79–97)
Monocytes Absolute: 0.4 10*3/uL (ref 0.1–0.9)
Monocytes: 10 %
Neutrophils Absolute: 2.4 10*3/uL (ref 1.4–7.0)
Neutrophils: 56 %
Platelets: 138 10*3/uL — ABNORMAL LOW (ref 150–450)
RBC: 3.77 x10E6/uL — ABNORMAL LOW (ref 4.14–5.80)
RDW: 12 % (ref 11.6–15.4)
Total Iron Binding Capacity: 210 ug/dL — ABNORMAL LOW (ref 250–450)
UIBC: 70 ug/dL — ABNORMAL LOW (ref 111–343)
Vitamin B-12: 834 pg/mL (ref 232–1245)
WBC: 4.2 10*3/uL (ref 3.4–10.8)

## 2018-10-14 LAB — SPECIMEN STATUS REPORT

## 2018-10-22 ENCOUNTER — Other Ambulatory Visit: Payer: Self-pay | Admitting: Adult Health

## 2018-10-24 NOTE — Telephone Encounter (Signed)
Is this patient to continue this medication?  Charyl Bigger, CMA

## 2018-11-21 NOTE — Progress Notes (Signed)
Virtual Visit via Telephone Note  I connected with Leroy Bell on 11/22/2018 at  9:45 AM EDT by telephone and verified that I am speaking with the correct person using two identifiers.  Location: Patient: Home Provider: In Clinic   I discussed the limitations, risks, security and privacy concerns of performing an evaluation and management service by telephone and the availability of in person appointments. I also discussed with the patient that there may be a patient responsible charge related to this service. The patient expressed understanding and agreed to proceed.   History of Present Illness: 09/01/2018 OV: Leroy Bell calls in today to review recent lab results: CMP: Alk Phos- 221 AST- 44 ALT- 24 Last heavy use of ETOH 1 April, prior to hospital admission He reports having one beer since hospital d/c 08/06/2018 He denies using Acetaminophen He denies RUQ pain He denies change in bowel habits, reports well, formed BM once day or once every other day. He was able to provide more detail of his ETOH use today- He states "I was drinking heavily this time for the last two years with only a week off here or there". He has not established with Behavioral Health- referral placed, 08/23/2018 Discussed utilizing AA or looking for Zoom AA meetings He reports strong support system of family or friends  A1c- new onset diabetes 6.7 His mother, father, and sister all have/had T2D Discussed how to start Metformin, how to reduce CHO/sugar in diet  The 10-year ASCVD risk score Mikey Bussing DC Brooke Bonito., et al., 2013) is: 10.9%   Values used to calculate the score:     Age: 58 years     Sex: Male     Is Non-Hispanic African American: No     Diabetic: Yes     Tobacco smoker: No     Systolic Blood Pressure: 621 mmHg     Is BP treated: No     HDL Cholesterol: 52 mg/dL     Total Cholesterol: 184 mg/dL  LDL-113  11/22/2018 OV: Leroy Bell calls in today f/u: New onset T2D, Alcoholism He was started on  Metformin has titrated up to full 524m tablet with dinner, he denies GI upset He reports AM BG 118-127 He denies episodes of hypoglycemia He has been reducing CHO/sugar in diet He is quite active with work and yard work. He estimates to drink >90 oz water/day He reports drinking 8 beers over independence weekend, but otherwise alcohol free Discussed that even limited amounts of alcohol is dangerous and is how over-use re-starts Strongly recommend that he COMPLETELY ABSTAIN He reports strong support system He has a CPAP that was issued to him 2006, he is unsure of provider He reports stable mood He is not attending AA or any other substance abuse support groups  Lab Results  Component Value Date   HGBA1C 6.4 (H) 10/05/2018   HGBA1C 6.7 (H) 08/30/2018   HGBA1C 5.7 (H) 07/29/2018   Patient Care Team    Relationship Specialty Notifications Start End  DEsaw Grandchild NP PCP - General Family Medicine  08/23/18     Patient Active Problem List   Diagnosis Date Noted  . New onset type 2 diabetes mellitus ([place] 09/01/2018  . Elevated alkaline phosphatase level 09/01/2018  . Alcohol abuse 08/23/2018  . Healthcare maintenance 08/23/2018  . Elevated LFTs 08/23/2018  . Transaminitis   . Altered mental status   . Elevated bilirubin   . Essential hypertension   . Viral URI with cough   .  Sinus tachycardia   . Elevated blood-pressure reading without diagnosis of hypertension   . Alcohol withdrawal ([place]) 07/28/2018     Past Medical History:  Diagnosis Date  . Alcohol abuse   . Diabetes [facility])      Past Surgical History:  Procedure Laterality Date  . NO PAST SURGERIES       Family History  Problem Relation Age of Onset  . Diabetes Mother   . Hypertension Mother   . Diabetes Father   . Hypertension Father   . Heart attack Father   . Prostate cancer Father   . Diabetes Sister   . Healthy Daughter   . Healthy Son   . Diabetes Paternal Grandmother   . Diabetes Paternal  Grandfather   . Cancer Paternal Grandfather        unknown      Social History   Substance and Sexual Activity  Drug Use Not Currently     Social History   Substance and Sexual Activity  Alcohol Use Not Currently   Comment: stopped on 07/24/18     Social History   Tobacco Use  Smoking Status Never Smoker  Smokeless Tobacco Current User  . Types: Snuff     Outpatient Encounter Medications as of 11/22/2018  Medication Sig  . Blood Glucose Monitoring Suppl (ONETOUCH VERIO FLEX SYSTEM) w/Device KIT Use to check blood sugars every morning fasting and 2 hours after largest meal  . glucose blood (ONETOUCH VERIO) test strip Use as instructed  . metFORMIN (GLUCOPHAGE) 500 MG tablet 1/2 tablet with dinner for two weeks, then increase to one full tablet with dinner  . [DISCONTINUED] folic acid (FOLVITE) 1 MG tablet TAKE 1 TABLET BY MOUTH EVERY DAY  . [DISCONTINUED] thiamine 100 MG tablet Take 1 tablet (100 mg total) by mouth daily.   No facility-administered encounter medications on file as of 11/22/2018.     Allergies: Penicillins  There is no height or weight on file to calculate BMI.  Blood pressure 130/72, pulse 72, temperature 98.7 F (37.1 C), temperature source Oral. Review of Systems: General:   Denies fever, chills, unexplained weight loss.  Optho/Auditory:   Denies visual changes, blurred vision/LOV Respiratory:   Denies SOB, DOE more than baseline levels.  Cardiovascular:   Denies chest pain, palpitations, new onset peripheral edema  Gastrointestinal:   Denies nausea, vomiting, diarrhea.  Genitourinary: Denies dysuria, freq/ urgency, flank pain or discharge from genitals.  Endocrine:     Denies hot or cold intolerance, polyuria, polydipsia. Musculoskeletal:   Denies unexplained myalgias, joint swelling, unexplained arthralgias, gait problems.  Skin:  Denies rash, suspicious lesions Neurological:     Denies dizziness, unexplained weakness, numbness   Psychiatric/Behavioral:   Denies mood changes, suicidal or homicidal ideations, hallucinations This patient does not have sx concerning for COVID-19 Infection (ie; fever, chills, cough, new or worsening shortness of breath).   Observations/Objective: No acute distress noted during the telephone converstation  Assessment and Plan: Continue all medications as directed Remain well hydrated, follow diabetic diet Continue to check BG daily ABSTAIN from all ETOH Please contact previous provider that issued CPAP, if unable to locate whom it was, let us know and will refer to pulmonologist  Continue to social distance and wear a mask when out in public  Follow Up Instructions:    I discussed the assessment and treatment plan with the patient. The patient was provided an opportunity to ask questions and all were answered. The patient agreed with the plan and demonstrated  an understanding of the instructions.   The patient was advised to call back or seek an in-person evaluation if the symptoms worsen or if the condition fails to improve as anticipated.  I provided 24 minutes of non-face-to-face time during this encounter.   Esaw Grandchild, NP

## 2018-11-22 ENCOUNTER — Ambulatory Visit: Payer: Managed Care, Other (non HMO) | Admitting: Adult Health

## 2018-11-22 ENCOUNTER — Other Ambulatory Visit: Payer: Self-pay

## 2018-11-22 ENCOUNTER — Ambulatory Visit (INDEPENDENT_AMBULATORY_CARE_PROVIDER_SITE_OTHER): Payer: Managed Care, Other (non HMO) | Admitting: Adult Health

## 2018-11-22 ENCOUNTER — Encounter: Payer: Self-pay | Admitting: Adult Health

## 2018-11-22 VITALS — BP 130/72 | HR 72 | Temp 98.7°F

## 2018-11-22 DIAGNOSIS — E119 Type 2 diabetes mellitus without complications: Secondary | ICD-10-CM | POA: Diagnosis not present

## 2018-11-22 DIAGNOSIS — Z0000 Encounter for general adult medical examination without abnormal findings: Secondary | ICD-10-CM

## 2018-11-22 MED ORDER — ONETOUCH ULTRASOFT LANCETS MISC: 12 refills | Status: DC

## 2018-11-22 MED ORDER — METFORMIN HCL 500 MG PO TABS: ORAL_TABLET | ORAL | 0 refills | Status: DC

## 2018-11-22 NOTE — Assessment & Plan Note (Signed)
Assessment and Plan: Continue all medications as directed Remain well hydrated, follow diabetic diet Continue to check BG daily ABSTAIN from all ETOH Please contact previous provider that issued CPAP, if unable to locate whom it was, let us know and will refer to pulmonologist  Continue to social distance and wear a mask when out in public  Follow Up Instructions:    I discussed the assessment and treatment plan with the patient. The patient was provided an opportunity to ask questions and all were answered. The patient agreed with the plan and demonstrated an understanding of the instructions.

## 2018-11-22 NOTE — Assessment & Plan Note (Signed)
Lab Results  Component Value Date   HGBA1C 6.4 (H) 10/05/2018   HGBA1C 6.7 (H) 08/30/2018   HGBA1C 5.7 (H) 07/29/2018  Continue Metformin 500mg  1 tab with dinner-he denies GI upset He reports AM BG 118-127 He denies episodes of hypoglycemia

## 2018-11-23 ENCOUNTER — Telehealth: Payer: Self-pay | Admitting: Adult Health

## 2018-11-23 ENCOUNTER — Other Ambulatory Visit: Payer: Self-pay | Admitting: Adult Health

## 2018-11-23 DIAGNOSIS — G4733 Obstructive sleep apnea (adult) (pediatric): Secondary | ICD-10-CM

## 2018-11-23 NOTE — Telephone Encounter (Signed)
Referral placed.  LVM informing pt.  Charyl Bigger, CMA

## 2018-11-23 NOTE — Addendum Note (Signed)
Addended by: Fonnie Mu on: 11/23/2018 03:24 PM   Modules accepted: Orders

## 2018-11-23 NOTE — Telephone Encounter (Signed)
Patient would like to move ahead with pulm referral for possible new sleep study and CPAP needs. Please place referral in applicable

## 2018-12-12 NOTE — Progress Notes (Deleted)
Subjective:    Patient ID: Leroy Bell, male    DOB: [birth year], 58 y.o.   MRN: [id number]  HPI:  09/01/2018 OV: Leroy Bell calls in today to review recent lab results: CMP: Alk Phos- 221 AST- 44 ALT- 24 Last heavy use of ETOH 1 April, prior to hospital admission He reports having one beer since hospital d/c 08/06/2018 He denies using Acetaminophen He denies RUQ pain He denies change in bowel habits, reports well, formed BM once day or once every other day. He was able to provide more detail of his ETOH use today- He states "I was drinking heavily this time for the last two years with only a week off here or there". He has not established with Behavioral Health- referral placed, 08/23/2018 Discussed utilizing AA or looking for Zoom AA meetings He reports strong support system of family or friends  A1c- new onset diabetes 6.7 His mother, father, and sister all have/had T2D Discussed how to start Metformin, how to reduce CHO/sugar in diet  The 10-year ASCVD risk score Mikey Bussing DC Brooke Bonito., et al., 2013) is: 10.9% Values used to calculate the score: Age: 58 years Sex: Male Is Non-Hispanic African American: No Diabetic: Yes Tobacco smoker: No Systolic Blood Pressure: 409 mmHg Is BP treated: No HDL Cholesterol: 52 mg/dL Total Cholesterol: 184 mg/dL LDL-113 11/22/2018 OV: Leroy Bell calls in today f/u: New onset T2D, Alcoholism He was started on Metformin has titrated up to full 560m tablet with dinner, he denies GI upset He reports AM BG 118-127 He denies episodes of hypoglycemia He has been reducing CHO/sugar in diet He is quite active with work and yard work. He estimates to drink >90 oz water/day He reports drinking 8 beers over independence weekend, but otherwise alcohol free Discussed that even limited amounts of alcohol is dangerous and is how over-use re-starts Strongly recommend that he COMPLETELY ABSTAIN He reports strong  support system He has a CPAP that was issued to him 2006, he is unsure of provider He reports stable mood He is not attending AA or any other substance abuse support groups  12/12/2018 OV: Leroy Bell here for f/u: He reports AM BG  He denies episodes of hypoglycemia  Lab Results  Component Value Date   HGBA1C 6.4 (H) 10/05/2018   HGBA1C 6.7 (H) 08/30/2018   HGBA1C 5.7 (H) 07/29/2018   Patient Care Team    Relationship Specialty Notifications Start End  DEsaw Grandchild NP PCP - General Family Medicine  08/23/18     Patient Active Problem List   Diagnosis Date Noted  . New onset type 2 diabetes mellitus ([place] 09/01/2018  . Elevated alkaline phosphatase level 09/01/2018  . Alcohol abuse 08/23/2018  . Healthcare maintenance 08/23/2018  . Elevated LFTs 08/23/2018  . Transaminitis   . Altered mental status   . Elevated bilirubin   . Essential hypertension   . Viral URI with cough   . Sinus tachycardia   . Elevated blood-pressure reading without diagnosis of hypertension   . Alcohol withdrawal ([place] 07/28/2018     Past Medical History:  Diagnosis Date  . Alcohol abuse   . Diabetes ([facility]      Past Surgical History:  Procedure Laterality Date  . NO PAST SURGERIES       Family History  Problem Relation Age of Onset  . Diabetes Mother   . Hypertension Mother   . Diabetes Father   . Hypertension Father   . Heart attack Father   .  Prostate cancer Father   . Diabetes Sister   . Healthy Daughter   . Healthy Son   . Diabetes Paternal Grandmother   . Diabetes Paternal Grandfather   . Cancer Paternal Grandfather        unknown      Social History   Substance and Sexual Activity  Drug Use Not Currently     Social History   Substance and Sexual Activity  Alcohol Use Not Currently   Comment: stopped on 07/24/18     Social History   Tobacco Use  Smoking Status Never Smoker  Smokeless Tobacco Current User  . Types: Snuff     Outpatient  Encounter Medications as of 12/14/2018  Medication Sig  . Blood Glucose Monitoring Suppl (ONETOUCH VERIO FLEX SYSTEM) w/Device KIT Use to check blood sugars every morning fasting and 2 hours after largest meal  . glucose blood (ONETOUCH VERIO) test strip Use as instructed  . Lancets (ONETOUCH ULTRASOFT) lancets Use to check blood sugars every morning fasting  . metFORMIN (GLUCOPHAGE) 500 MG tablet 1 tablet with dinner   No facility-administered encounter medications on file as of 12/14/2018.     Allergies: Penicillins  There is no height or weight on file to calculate BMI.  There were no vitals taken for this visit.     Review of Systems     Objective:   Physical Exam        Assessment & Plan:  No diagnosis found.  No problem-specific Assessment & Plan notes found for this encounter.    FOLLOW-UP:  No follow-ups on file.

## 2018-12-13 ENCOUNTER — Telehealth: Payer: Self-pay | Admitting: Adult Health

## 2018-12-13 DIAGNOSIS — E119 Type 2 diabetes mellitus without complications: Secondary | ICD-10-CM

## 2018-12-13 MED ORDER — GLUCOSE BLOOD VI STRP: ORAL_STRIP | 12 refills | Status: DC

## 2018-12-13 NOTE — Telephone Encounter (Signed)
Patient called states job has sent him out of town to conference & cancelled 8/19 appt --- Patient also says incorrect Glucose monitor lancets & strips sent and they do not Fit    --His monitor is a  [employer]) & he needs their matching lancets & test strips:   ( glucose blood (ONETOUCH VERIO) test strip [[id number]]   Order Details Dose, Route, Frequency: As Directed  Dispense Quantity: 100 each Refills: 12 Fills remaining: --      & Lancets  ---Forwarding request to medical assistant to send to :   [id number] [address], [place] [phone] (Phone) [phone] (Fax)   --glh

## 2018-12-14 ENCOUNTER — Ambulatory Visit: Payer: Managed Care, Other (non HMO) | Admitting: Adult Health

## 2018-12-20 ENCOUNTER — Other Ambulatory Visit: Payer: Self-pay | Admitting: Adult Health

## 2018-12-29 ENCOUNTER — Other Ambulatory Visit: Payer: Self-pay | Admitting: Adult Health

## 2019-01-03 ENCOUNTER — Telehealth: Payer: Self-pay | Admitting: Adult Health

## 2019-01-03 MED ORDER — ONETOUCH DELICA LANCETS 33G MISC: 3 refills | Status: DC

## 2019-01-03 MED ORDER — METFORMIN HCL 500 MG PO TABS: ORAL_TABLET | ORAL | 0 refills | Status: DC

## 2019-01-03 NOTE — Telephone Encounter (Signed)
Patient called states (DID NOT GET THE (ONETOUCH VERIO FLEX LANCET )  --Apologized for it being left out of the refill order frm 12/13/18 assured him it was in the request sent.  ----- Patient still needs them-----  Patient also request refill on :   metFORMIN (GLUCOPHAGE) 500 MG tablet [[id number]]   Order Details Dose, Route, Frequency: As Directed  Indications of Use: Type 2 Diabetes Mellitus  Dispense Quantity: 90 tablet Refills: 0 Fills remaining: --        Sig: 1 tablet with dinner          ---Forwarding request to medical assistant that if approved send refill order to :   [id number] [address], [place] [phone] (Phone) [phone] (Fax)   --glh

## 2019-03-08 ENCOUNTER — Other Ambulatory Visit: Payer: Self-pay | Admitting: Adult Health

## 2019-03-17 ENCOUNTER — Encounter: Payer: Self-pay | Admitting: Adult Health

## 2019-04-24 ENCOUNTER — Telehealth: Payer: Self-pay | Admitting: Adult Health

## 2019-04-24 ENCOUNTER — Other Ambulatory Visit: Payer: Self-pay | Admitting: Adult Health

## 2019-04-24 MED ORDER — METFORMIN HCL 500 MG PO TABS: ORAL_TABLET | ORAL | 0 refills | Status: DC

## 2019-04-24 NOTE — Telephone Encounter (Signed)
Patient called states has only 1 day of Metformin left--refill requested.  metFORMIN (GLUCOPHAGE) 500 MG tablet [[id number]]   Order Details Dose, Route, Frequency: As Directed  Dispense Quantity: 90 tablet Refills: 0   Indications of Use: Type 2 Diabetes Mellitus      Sig: 1 tablet with dinner       --Forwarding request to med asst to send to :  Preferred Pharmacies      [id number] [address], [place] [phone] (Phone) [phone] (Fax)   --glh

## 2019-04-24 NOTE — Telephone Encounter (Signed)
Please call pt and advise him that RX forf 30 day supply sent to pharmacy.  Office visit required prior to any further refills.  Charyl Bigger, CMA

## 2019-05-17 ENCOUNTER — Other Ambulatory Visit: Payer: Self-pay | Admitting: Adult Health

## 2019-05-26 ENCOUNTER — Telehealth: Payer: Self-pay | Admitting: Adult Health

## 2019-05-26 NOTE — Telephone Encounter (Signed)
Patient left message to call office to set up Appt .- see CMA's msg below-- glh  Please call pt and advise him that RX forf 30 day supply sent to pharmacy.  Office visit required prior to any further refills.  Tiajuana Amass, CMA

## 2019-06-01 ENCOUNTER — Other Ambulatory Visit: Payer: Self-pay | Admitting: Adult Health

## 2019-07-03 ENCOUNTER — Telehealth: Payer: Self-pay | Admitting: Adult Health

## 2019-07-03 ENCOUNTER — Encounter: Payer: Self-pay | Admitting: Family Medicine

## 2019-07-03 NOTE — Telephone Encounter (Signed)
Left message for patient to call office back in regards to note for COVID vaccine.   Dr. Sharee Holster reviewed the criteria for COVID vaccines in [facility] and states she does not deem patient medical vulnerable due to new onset of diabetes.  AS, CMA

## 2019-07-03 NOTE — Telephone Encounter (Signed)
Please see paper in your basket. AS, CMA

## 2019-07-03 NOTE — Telephone Encounter (Signed)
This form has been placed in your basket with charge sheet and face sheet attached. AS, CMA

## 2019-07-03 NOTE — Telephone Encounter (Signed)
Unless [place] has different rules and regulations for who can get the Covid immunization, he would not qualify for it at this time even with his diabetes.    Hence, he would have to send [place] in order for [place] to sign the paperwork

## 2019-07-03 NOTE — Telephone Encounter (Signed)
Patient has been working in [place] the last two months and will be down there for another 2 months before returning to [facility]. He is getting his COVID shot tomorrow but needs a form filled out that he will email/fax to [place]. It just needs to be signed off that he has diabetes to allow him to get the shot earlier than what he traditionally could. Please be on the look out for this form so we can send it off today for him to have tomorrow.  Also, he needs a refill of his metformin, if approved [facility] [address] and his daughter will ship it to him.

## 2019-07-03 NOTE — Telephone Encounter (Signed)
Per Dr. Sharee Holster unable to fill out form requested by patient stating that he needs COVID vaccine due to Diabetes diagnosis. Per Dr. Champ Mungo to give letter stating patient diagnosis for Diabetes. AS, CMA

## 2019-07-04 NOTE — Telephone Encounter (Signed)
Patient is aware Dr. Sharee Holster states she is unable to fill out medical form. AS, CMA

## 2019-07-14 ENCOUNTER — Telehealth: Payer: Self-pay | Admitting: Adult Health

## 2019-07-14 NOTE — Telephone Encounter (Signed)
Please follow medication refill guidelines as in our clinic's protocol book

## 2019-07-14 NOTE — Telephone Encounter (Signed)
Pt called to f/u on Metformin refill --advised OV/Telehealth required & that @ last OV was 10/2018--he had been advised several times that a OV was necessary for Rx refills --- per provider, pt non compliant  (Refill not approved)-- Attempted to schedule pt fr next available appt/ Telehealth not til 3/29 @ 2 --Pt states to forget it he will just get New provider b/c he needs his Rx now & hung up.  --FYI to med asst.    --glh

## 2019-07-14 NOTE — Telephone Encounter (Signed)
Fyi.

## 2019-11-10 ENCOUNTER — Ambulatory Visit
Admission: EM | Admit: 2019-11-10 | Discharge: 2019-11-10 | Disposition: A | Discharge status: Home / Self Care | Payer: Managed Care, Other (non HMO) | Attending: Emergency Medicine | Admitting: Emergency Medicine

## 2019-11-10 ENCOUNTER — Other Ambulatory Visit: Payer: Self-pay

## 2019-11-10 DIAGNOSIS — R531 Weakness: Secondary | ICD-10-CM | POA: Diagnosis not present

## 2019-11-10 DIAGNOSIS — R110 Nausea: Secondary | ICD-10-CM | POA: Diagnosis not present

## 2019-11-10 DIAGNOSIS — R000 Tachycardia, unspecified: Secondary | ICD-10-CM | POA: Diagnosis not present

## 2019-11-10 DIAGNOSIS — K921 Melena: Secondary | ICD-10-CM | POA: Diagnosis not present

## 2019-11-10 DIAGNOSIS — R17 Unspecified jaundice: Secondary | ICD-10-CM

## 2019-11-10 LAB — POCT FASTING CBG KUC MANUAL ENTRY: POCT Glucose (KUC): 184 mg/dL — AB (ref 70–99)

## 2019-11-10 MED ORDER — ONDANSETRON 4 MG PO TBDP
4.0000 mg | ORAL_TABLET | Freq: Once | ORAL | Status: AC
  Administered 2019-11-10: 4 mg via ORAL

## 2019-11-10 NOTE — ED Provider Notes (Signed)
[facility]    CSN: [id number] Arrival date & time: 11/10/19  1240      History   Chief Complaint Chief Complaint  Patient presents with  . Nausea  . Rectal Bleeding    HPI Leroy Bell is a 59 y.o. male with history of alcohol abuse, type 2 diabetes, hypertension, obesity, transaminitis presenting for nausea, black and bloody stool since Tuesday.  States has been out of his blood pressure medication for a year: Denying headache, chest pain, palpitations, shortness of breath, severe abdominal pain.  Patient does admit to generalized, mild abdominal pain.  Denies emesis.  Last ate 1.5 hours PTA.  States he checks his sugar "every 3 days or so".  Does endorse weakness.  Denies history of diverticulitis, PUD, hemorrhoids.  Last BM yesterday: Dark and with blood.  Denies blood thinner/anticoagulant use.   Past Medical History:  Diagnosis Date  . Alcohol abuse   . Diabetes [facility])     Patient Active Problem List   Diagnosis Date Noted  . New onset type 2 diabetes mellitus ([place]) 09/01/2018  . Elevated alkaline phosphatase level 09/01/2018  . Alcohol abuse 08/23/2018  . Healthcare maintenance 08/23/2018  . Elevated LFTs 08/23/2018  . Transaminitis   . Altered mental status   . Elevated bilirubin   . Essential hypertension   . Viral URI with cough   . Sinus tachycardia   . Elevated blood-pressure reading without diagnosis of hypertension   . Alcohol withdrawal ([place]) 07/28/2018    Past Surgical History:  Procedure Laterality Date  . NO PAST SURGERIES         Home Medications    Prior to Admission medications   Medication Sig Start Date End Date Taking? Authorizing Provider  Blood Glucose Monitoring Suppl ([place]) w/Device KIT Use to check blood sugars every morning fasting and 2 hours after largest meal 09/08/18   Danford, Katy D, NP  glucose blood test strip Use to check blood sugars every morning fasting and 2 hours after largest  meal 12/13/18   Danford, Valetta Fuller D, NP  metFORMIN (GLUCOPHAGE) 500 MG tablet TAKE 1 TABLET WITH DINNER. OFFICE VISIT REQUIRED PRIOR TO ANY FURTHER REFILLS 05/17/19   Esaw Grandchild, NP  OneTouch Delica Lancets 40J MISC Use to check blood sugars every morning fasting and 2 hours after largest meal 01/03/19   Danford, Berna Spare, NP    Family History Family History  Problem Relation Age of Onset  . Diabetes Mother   . Hypertension Mother   . Diabetes Father   . Hypertension Father   . Heart attack Father   . Prostate cancer Father   . Diabetes Sister   . Healthy Daughter   . Healthy Son   . Diabetes Paternal Grandmother   . Diabetes Paternal Grandfather   . Cancer Paternal Grandfather        unknown     Social History Social History   Tobacco Use  . Smoking status: Never Smoker  . Smokeless tobacco: Current User    Types: Snuff  Vaping Use  . Vaping Use: Never used  Substance Use Topics  . Alcohol use: Not Currently    Comment: stopped on 07/24/18  . Drug use: Not Currently     Allergies   Penicillins   Review of Systems As per HPI   Physical Exam Triage Vital Signs ED Triage Vitals [11/10/19 1252]  Enc Vitals Group     BP (!) 186/74  Pulse Rate (!) 104     Resp 18     Temp 98 F (36.7 C)     Temp src      SpO2 97 %     Weight      Height      Head Circumference      Peak Flow      Pain Score 0     Pain Loc      Pain Edu?      Excl. in [place]?    No data found.  Updated Vital Signs BP (!) 186/74   Pulse (!) 104   Temp 98 F (36.7 C)   Resp 18   SpO2 97%   Visual Acuity Right Eye Distance:   Left Eye Distance:   Bilateral Distance:    Right Eye Near:   Left Eye Near:    Bilateral Near:     Physical Exam Constitutional:      General: He is not in acute distress.    Appearance: He is obese. He is ill-appearing. He is not toxic-appearing or diaphoretic.  HENT:     Head: Normocephalic and atraumatic.     Mouth/Throat:     Mouth: Mucous  membranes are moist.     Pharynx: Oropharynx is clear.  Eyes:     General: Scleral icterus present.     Conjunctiva/sclera: Conjunctivae normal.     Pupils: Pupils are equal, round, and reactive to light.     Comments: mild  Cardiovascular:     Rate and Rhythm: Regular rhythm. Tachycardia present.  Pulmonary:     Effort: Pulmonary effort is normal. No respiratory distress.     Breath sounds: No wheezing or rales.  Abdominal:     General: Bowel sounds are normal. There is distension.     Tenderness: There is abdominal tenderness. There is no rebound.     Comments: diffuse.  Negative Murphy's, McBurney's, Rovsing sign  Musculoskeletal:     Right lower leg: No edema.     Left lower leg: No edema.  Skin:    Capillary Refill: Capillary refill takes less than 2 seconds.     Coloration: Skin is not jaundiced or pale.  Neurological:     Mental Status: He is alert and oriented to person, place, and time.     Sensory: No sensory deficit.     Deep Tendon Reflexes: Reflexes normal.     Comments: Patient tremulous, borderline asterixis (shaking though w/o clonus)      UC Treatments / Results  Labs (all labs ordered are listed, but only abnormal results are displayed) Labs Reviewed  POCT FASTING CBG [facility] MANUAL ENTRY - Abnormal; Notable for the following components:      Result Value   POCT Glucose ([facility]) 184 (*)    All other components within normal limits    EKG   Radiology No results found.  Procedures Procedures (including critical care time)  Medications Ordered in UC Medications  ondansetron (ZOFRAN-ODT) disintegrating tablet 4 mg (4 mg Oral Given 11/10/19 1309)    Initial Impression / Assessment and Plan / UC Course  I have reviewed the triage vital signs and the nursing notes.  Pertinent labs & imaging results that were available during my care of the patient were reviewed by me and considered in my medical decision making (see chart for details).     Patient  afebrile, nontoxic in office today.  Patient is tachycardic with mild scleral icterus (unknown if chronic given patient's  medical history and poor knowledge thereof) patient states he last drank Sunday: Only does so on the weekends.  Denies history of post withdrawal seizure.  Declined rectal exam.  Given patient's history, clinical appearance, and comorbidities, recommended patient go to ER for further evaluation.  Patient states he will present via personal vehicle (daughter brought him), though declined allowing this provider to speak with daughter regarding clinical status.  Discussed risks of not pursuing further care in ER setting including, but not limited to, death.  Patient verbalized understanding. Final Clinical Impressions(s) / UC Diagnoses   Final diagnoses:  Nausea without vomiting  Weakness  Tachycardia  Blood in stool  Scleral icterus     Discharge Instructions     Recommend you go to ER for further evaluation of your symptoms.    ED Prescriptions    None     PDMP not reviewed this encounter.   Hall[place]vin, [place], [place] 11/10/19 1426

## 2019-11-10 NOTE — ED Notes (Signed)
Patient is being discharged from the Urgent Care and sent to the Emergency Department via POV with wife as designated driver . Per [place], [place], patient is in need of higher level of care due to melena, rectal bleeding and jaundice. Patient is aware and verbalizes understanding of plan of care.  Vitals:   11/10/19 1252  BP: (!) 186/74  Pulse: (!) 104  Resp: 18  Temp: 98 F (36.7 C)  SpO2: 97%

## 2019-11-10 NOTE — Discharge Instructions (Signed)
Recommend you go to ER for further evaluation of your symptoms. °

## 2019-11-10 NOTE — ED Triage Notes (Signed)
Pt c/o nausea with black and bloody mixed stool since Tuesday.  Pt out of BP med x 1 year

## 2019-12-25 ENCOUNTER — Ambulatory Visit: Payer: Managed Care, Other (non HMO) | Admitting: Family Medicine

## 2020-04-25 ENCOUNTER — Emergency Department (HOSPITAL_COMMUNITY)
Admission: EM | Admit: 2020-04-25 | Discharge: 2020-04-26 | Disposition: A | Discharge status: Home / Self Care | Payer: Managed Care, Other (non HMO) | Attending: Emergency Medicine | Admitting: Emergency Medicine

## 2020-04-25 ENCOUNTER — Other Ambulatory Visit: Payer: Self-pay

## 2020-04-25 DIAGNOSIS — Z5321 Procedure and treatment not carried out due to patient leaving prior to being seen by health care provider: Secondary | ICD-10-CM | POA: Diagnosis not present

## 2020-04-25 DIAGNOSIS — K625 Hemorrhage of anus and rectum: Secondary | ICD-10-CM | POA: Diagnosis not present

## 2020-04-25 LAB — CBC
HCT: 27.2 % — ABNORMAL LOW (ref 39.0–52.0)
Hemoglobin: 9.4 g/dL — ABNORMAL LOW (ref 13.0–17.0)
MCH: 34.1 pg — ABNORMAL HIGH (ref 26.0–34.0)
MCHC: 34.6 g/dL (ref 30.0–36.0)
MCV: 98.6 fL (ref 80.0–100.0)
Platelets: 126 10*3/uL — ABNORMAL LOW (ref 150–400)
RBC: 2.76 MIL/uL — ABNORMAL LOW (ref 4.22–5.81)
RDW: 12.8 % (ref 11.5–15.5)
WBC: 6.5 10*3/uL (ref 4.0–10.5)
nRBC: 0 % (ref 0.0–0.2)

## 2020-04-25 LAB — COMPREHENSIVE METABOLIC PANEL
ALT: 34 U/L (ref 0–44)
AST: 68 U/L — ABNORMAL HIGH (ref 15–41)
Albumin: 3 g/dL — ABNORMAL LOW (ref 3.5–5.0)
Alkaline Phosphatase: 178 U/L — ABNORMAL HIGH (ref 38–126)
Anion gap: 10 (ref 5–15)
BUN: 6 mg/dL (ref 6–20)
CO2: 24 mmol/L (ref 22–32)
Calcium: 9 mg/dL (ref 8.9–10.3)
Chloride: 97 mmol/L — ABNORMAL LOW (ref 98–111)
Creatinine, Ser: 0.82 mg/dL (ref 0.61–1.24)
GFR, Estimated: >60 mL/min (ref >60)
Glucose, Bld: 340 mg/dL — ABNORMAL HIGH (ref 70–99)
Potassium: 3.6 mmol/L (ref 3.5–5.1)
Sodium: 131 mmol/L — ABNORMAL LOW (ref 135–145)
Total Bilirubin: 2 mg/dL — ABNORMAL HIGH (ref 0.3–1.2)
Total Protein: 6.1 g/dL — ABNORMAL LOW (ref 6.5–8.1)

## 2020-04-25 LAB — TYPE AND SCREEN
ABO/RH(D): O POS
Antibody Screen: NEGATIVE

## 2020-04-25 NOTE — ED Notes (Signed)
Pt has left building. Pt stated, "I have a flight at 4am, I have to leave." Pts arm bands have been cut off.

## 2020-04-25 NOTE — ED Triage Notes (Signed)
Pt presents to ED POV. Pt c/o rectal bleeding intermittent throughout the past few months. Pt reports large clots coming out. Denies pain.

## 2020-05-16 ENCOUNTER — Other Ambulatory Visit: Payer: Self-pay | Admitting: Physician Assistant

## 2020-05-16 DIAGNOSIS — R7989 Other specified abnormal findings of blood chemistry: Secondary | ICD-10-CM

## 2020-05-16 DIAGNOSIS — R945 Abnormal results of liver function studies: Secondary | ICD-10-CM

## 2020-05-23 LAB — HM COLONOSCOPY

## 2020-05-29 ENCOUNTER — Ambulatory Visit
Admission: RE | Admit: 2020-05-29 | Discharge: 2020-05-29 | Disposition: A | Discharge status: Home / Self Care | Payer: Managed Care, Other (non HMO) | Source: Ambulatory Visit | Attending: Physician Assistant | Admitting: Physician Assistant

## 2020-05-29 DIAGNOSIS — R7989 Other specified abnormal findings of blood chemistry: Secondary | ICD-10-CM

## 2020-05-29 DIAGNOSIS — R945 Abnormal results of liver function studies: Secondary | ICD-10-CM

## 2020-06-05 LAB — HM DIABETES EYE EXAM

## 2020-06-10 ENCOUNTER — Other Ambulatory Visit: Payer: Self-pay | Admitting: Physician Assistant

## 2020-06-10 DIAGNOSIS — R9389 Abnormal findings on diagnostic imaging of other specified body structures: Secondary | ICD-10-CM

## 2020-06-14 ENCOUNTER — Telehealth: Payer: Self-pay | Admitting: Internal Medicine

## 2020-06-14 NOTE — Telephone Encounter (Signed)
Received a new hem referral from Leroy Bell, [place] for IDA. Leroy Bell has been cld and scheduled to see Dr. Arbutus Ped on 3/7 at 11:45am w/labs at 11:15am. Pt aware to arrive 15 minutes early.

## 2020-06-29 ENCOUNTER — Other Ambulatory Visit: Payer: Self-pay

## 2020-06-29 ENCOUNTER — Ambulatory Visit
Admission: RE | Admit: 2020-06-29 | Discharge: 2020-06-29 | Disposition: A | Discharge status: Home / Self Care | Payer: Managed Care, Other (non HMO) | Source: Ambulatory Visit | Attending: Physician Assistant | Admitting: Physician Assistant

## 2020-06-29 DIAGNOSIS — R9389 Abnormal findings on diagnostic imaging of other specified body structures: Secondary | ICD-10-CM

## 2020-06-29 MED ORDER — GADOBENATE DIMEGLUMINE 529 MG/ML IV SOLN
16.0000 mL | Freq: Once | INTRAVENOUS | Status: AC | PRN
  Administered 2020-06-29: 16 mL via INTRAVENOUS

## 2020-07-01 ENCOUNTER — Encounter: Payer: Self-pay | Admitting: Internal Medicine

## 2020-07-01 ENCOUNTER — Other Ambulatory Visit: Payer: Self-pay | Admitting: Internal Medicine

## 2020-07-01 ENCOUNTER — Inpatient Hospital Stay: Payer: Managed Care, Other (non HMO) | Attending: Internal Medicine | Admitting: Internal Medicine

## 2020-07-01 ENCOUNTER — Inpatient Hospital Stay: Payer: Managed Care, Other (non HMO)

## 2020-07-01 ENCOUNTER — Other Ambulatory Visit: Payer: Self-pay

## 2020-07-01 VITALS — BP 177/74 | HR 105 | Temp 97.7°F | Resp 13 | Ht 67.0 in | Wt 181.5 lb

## 2020-07-01 DIAGNOSIS — K922 Gastrointestinal hemorrhage, unspecified: Secondary | ICD-10-CM | POA: Diagnosis not present

## 2020-07-01 DIAGNOSIS — D539 Nutritional anemia, unspecified: Secondary | ICD-10-CM

## 2020-07-01 DIAGNOSIS — E119 Type 2 diabetes mellitus without complications: Secondary | ICD-10-CM

## 2020-07-01 DIAGNOSIS — Z8042 Family history of malignant neoplasm of prostate: Secondary | ICD-10-CM

## 2020-07-01 DIAGNOSIS — D500 Iron deficiency anemia secondary to blood loss (chronic): Secondary | ICD-10-CM | POA: Insufficient documentation

## 2020-07-01 DIAGNOSIS — F1010 Alcohol abuse, uncomplicated: Secondary | ICD-10-CM | POA: Diagnosis not present

## 2020-07-01 DIAGNOSIS — Z720 Tobacco use: Secondary | ICD-10-CM

## 2020-07-01 DIAGNOSIS — I10 Essential (primary) hypertension: Secondary | ICD-10-CM | POA: Diagnosis not present

## 2020-07-01 LAB — CBC WITH DIFFERENTIAL (CANCER CENTER ONLY)
Abs Immature Granulocytes: 0.01 10*3/uL (ref 0.00–0.07)
Basophils Absolute: 0 10*3/uL (ref 0.0–0.1)
Basophils Relative: 1 %
Eosinophils Absolute: 0.1 10*3/uL (ref 0.0–0.5)
Eosinophils Relative: 2 %
HCT: 29.5 % — ABNORMAL LOW (ref 39.0–52.0)
Hemoglobin: 9.3 g/dL — ABNORMAL LOW (ref 13.0–17.0)
Immature Granulocytes: 0 %
Lymphocytes Relative: 20 %
Lymphs Abs: 0.6 10*3/uL — ABNORMAL LOW (ref 0.7–4.0)
MCH: 25.1 pg — ABNORMAL LOW (ref 26.0–34.0)
MCHC: 31.5 g/dL (ref 30.0–36.0)
MCV: 79.5 fL — ABNORMAL LOW (ref 80.0–100.0)
Monocytes Absolute: 0.4 10*3/uL (ref 0.1–1.0)
Monocytes Relative: 12 %
Neutro Abs: 2 10*3/uL (ref 1.7–7.7)
Neutrophils Relative %: 65 %
Platelet Count: 131 10*3/uL — ABNORMAL LOW (ref 150–400)
RBC: 3.71 MIL/uL — ABNORMAL LOW (ref 4.22–5.81)
RDW: 24.7 % — ABNORMAL HIGH (ref 11.5–15.5)
WBC Count: 3 10*3/uL — ABNORMAL LOW (ref 4.0–10.5)
nRBC: 0 % (ref 0.0–0.2)

## 2020-07-01 LAB — RETICULOCYTES
Immature Retic Fract: 22.9 % — ABNORMAL HIGH (ref 2.3–15.9)
RBC.: 3.75 MIL/uL — ABNORMAL LOW (ref 4.22–5.81)
Retic Count, Absolute: 51.8 10*3/uL (ref 19.0–186.0)
Retic Ct Pct: 1.4 % (ref 0.4–3.1)

## 2020-07-01 LAB — CMP (CANCER CENTER ONLY)
ALT: 29 U/L (ref 0–44)
AST: 51 U/L — ABNORMAL HIGH (ref 15–41)
Albumin: 3.6 g/dL (ref 3.5–5.0)
Alkaline Phosphatase: 198 U/L — ABNORMAL HIGH (ref 38–126)
Anion gap: 6 (ref 5–15)
BUN: 6 mg/dL (ref 6–20)
CO2: 23 mmol/L (ref 22–32)
Calcium: 8.8 mg/dL — ABNORMAL LOW (ref 8.9–10.3)
Chloride: 100 mmol/L (ref 98–111)
Creatinine: 0.97 mg/dL (ref 0.61–1.24)
GFR, Estimated: >60 mL/min
Glucose, Bld: 299 mg/dL — ABNORMAL HIGH (ref 70–99)
Potassium: 4.1 mmol/L (ref 3.5–5.1)
Sodium: 129 mmol/L — ABNORMAL LOW (ref 135–145)
Total Bilirubin: 1.7 mg/dL — ABNORMAL HIGH (ref 0.3–1.2)
Total Protein: 7.3 g/dL (ref 6.5–8.1)

## 2020-07-01 LAB — FOLATE: Folate: 12.4 ng/mL (ref >5.9)

## 2020-07-01 LAB — IRON AND TIBC
Iron: 33 ug/dL — ABNORMAL LOW (ref 42–163)
Saturation Ratios: 9 % — ABNORMAL LOW (ref 20–55)
TIBC: 348 ug/dL (ref 202–409)
UIBC: 315 ug/dL (ref 117–376)

## 2020-07-01 LAB — VITAMIN B12: Vitamin B-12: 866 pg/mL (ref 180–914)

## 2020-07-01 LAB — LACTATE DEHYDROGENASE: LDH: 182 U/L (ref 98–192)

## 2020-07-01 LAB — FERRITIN: Ferritin: 27 ng/mL (ref 24–336)

## 2020-07-01 NOTE — Progress Notes (Signed)
[place] [phone]   [phone]  CONSULT NOTE  REFERRING PHYSICIAN: Vicie Mutters with [facility] gastroenterology  REASON FOR CONSULTATION:  60 years old white male with persistent anemia.  HPI Leroy Bell is a 60 y.o. male with past medical history significant for hypertension, diabetes mellitus as well as alcohol abuse.  The patient mentioned that in December 2021 he presented to the hospital with significant fatigue and blood loss in his stool.  He had upper endoscopy and colonoscopy that showed some evidence for esophagitis and gastritis in addition to internal hemorrhoids but no concerning findings to explain the significant blood loss.  His hemoglobin on April 25, 2020 was 9.4 and hematocrit 27.2%.  Previous CBC on 10/05/2018 showed a hemoglobin of 12.8 and hematocrit 35.8%.  He also had a normal hemoglobin of 15.2 and hematocrit 44.2 on August 06, 2018.  The patient has a history of alcohol abuse.  He was seen recently by [facility] gastroenterology and started on oral iron tablets with iron fusion tablet but he could not tolerate the oral iron tablets and he discontinued it.  His last hemoglobin was down to 7.6 on May 16, 2020.  He also had low serum iron and ferritin.  The patient was referred to me today for further evaluation and recommendation regarding his condition and also consideration of IV iron infusion if needed. When seen today he is feeling fine with no concerning complaints except for occasional dizzy spells and shortness of breath.  He also has mild cough.  He denied having any nausea, vomiting, diarrhea or constipation.  He denied having any headache or visual changes.  He has no chest pain or hemoptysis.  He denied having any other bleeding, bruises or ecchymosis. Family history significant for mother with diabetes mellitus and hypertension, father had heart disease and prostate cancer and sister had diabetes mellitus and  anemia. The patient is married and has 2 children.  He is self-employed and owns a [employer].  He was accompanied today by his wife past.  He has no history for smoking but drinks alcohol at regular basis and no history of drug abuse.  HPI  Past Medical History:  Diagnosis Date  . Alcohol abuse   . Diabetes [facility])     Past Surgical History:  Procedure Laterality Date  . NO PAST SURGERIES      Family History  Problem Relation Age of Onset  . Diabetes Mother   . Hypertension Mother   . Diabetes Father   . Hypertension Father   . Heart attack Father   . Prostate cancer Father   . Diabetes Sister   . Healthy Daughter   . Healthy Son   . Diabetes Paternal Grandmother   . Diabetes Paternal Grandfather   . Cancer Paternal Grandfather        unknown     Social History Social History   Tobacco Use  . Smoking status: Never Smoker  . Smokeless tobacco: Current User    Types: Snuff  Vaping Use  . Vaping Use: Never used  Substance Use Topics  . Alcohol use: Not Currently    Comment: stopped on 07/24/18  . Drug use: Not Currently    Allergies  Allergen Reactions  . Penicillins Hives    Current Outpatient Medications  Medication Sig Dispense Refill  . Blood Glucose Monitoring Suppl ([place]) w/Device KIT Use to check blood sugars every morning fasting and 2 hours after largest meal  1 kit 0  . glucose blood test strip Use to check blood sugars every morning fasting and 2 hours after largest meal 100 each 12  . OneTouch Delica Lancets 99M MISC Use to check blood sugars every morning fasting and 2 hours after largest meal 100 each 3   No current facility-administered medications for this visit.    Review of Systems  Constitutional: positive for fatigue Eyes: negative Ears, nose, mouth, throat, and face: negative Respiratory: positive for dyspnea on exertion Cardiovascular: negative Gastrointestinal:  negative Genitourinary:negative Integument/breast: negative Hematologic/lymphatic: negative Musculoskeletal:negative Neurological: negative Behavioral/Psych: negative Endocrine: negative Allergic/Immunologic: negative  Physical Exam  EQA:STMHD, healthy, no distress, well nourished and well developed SKIN: skin color, texture, turgor are normal, no rashes or significant lesions HEAD: Normocephalic, No masses, lesions, tenderness or abnormalities EYES: normal, PERRLA, Conjunctiva are pink and non-injected EARS: External ears normal, Canals clear OROPHARYNX:no exudate, no erythema and lips, buccal mucosa, and tongue normal  NECK: supple, no adenopathy, no JVD LYMPH:  no palpable lymphadenopathy, no hepatosplenomegaly LUNGS: clear to auscultation , and palpation HEART: regular rate & rhythm, no murmurs and no gallops ABDOMEN:abdomen soft, non-tender, normal bowel sounds and no masses or organomegaly BACK: No CVA tenderness, Range of motion is normal EXTREMITIES:no joint deformities, effusion, or inflammation, no edema  NEURO: alert & oriented x 3 with fluent speech, no focal motor/sensory deficits  PERFORMANCE STATUS: ECOG 1  LABORATORY DATA: Lab Results  Component Value Date   WBC 6.5 04/25/2020   HGB 9.4 (L) 04/25/2020   HCT 27.2 (L) 04/25/2020   MCV 98.6 04/25/2020   PLT 126 (L) 04/25/2020      Chemistry      Component Value Date/Time   NA 131 (L) 04/25/2020 2049   NA 136 10/05/2018 0923   K 3.6 04/25/2020 2049   CL 97 (L) 04/25/2020 2049   CO2 24 04/25/2020 2049   BUN 6 04/25/2020 2049   BUN 6 10/05/2018 0923   CREATININE 0.82 04/25/2020 2049      Component Value Date/Time   CALCIUM 9.0 04/25/2020 2049   ALKPHOS 178 (H) 04/25/2020 2049   AST 68 (H) 04/25/2020 2049   ALT 34 04/25/2020 2049   BILITOT 2.0 (H) 04/25/2020 2049   BILITOT 1.6 (H) 10/05/2018 0923       RADIOGRAPHIC STUDIES: MR ABDOMEN MRCP W WO CONTAST  Result Date: 06/30/2020 CLINICAL DATA:   Elevated liver function tests. Abnormal ultrasound demonstrating hepatic and gallbladder abnormalities. Indeterminate right renal lesion. EXAM: MRI ABDOMEN WITHOUT AND WITH CONTRAST (INCLUDING MRCP) TECHNIQUE: Multiplanar multisequence MR imaging of the abdomen was performed both before and after the administration of intravenous contrast. Heavily T2-weighted images of the biliary and pancreatic ducts were obtained, and three-dimensional MRCP images were rendered by post processing. CONTRAST:  27m MULTIHANCE GADOBENATE DIMEGLUMINE 529 MG/ML IV SOLN COMPARISON:  05/29/2020 abdominal ultrasound. FINDINGS: Portions of exam are mildly motion degraded. Lower chest: Moderate left hemidiaphragm elevation. Normal heart size. No pericardial or pleural effusion. Periesophageal varices. Hepatobiliary: Moderate cirrhosis. No suspicious liver lesion. No gallbladder abnormality or dominant mass given above limitations. No biliary duct dilatation or choledocholithiasis. Pancreas: No evidence of acute pancreatitis. At least 1 cystic lesion identified within the pancreatic head and neck, including at 3 mm on 23/7 and 5 mm on 48/8. No suspicious postcontrast characteristics. Spleen:  Measures 12.2 x 13.5 x 7.5 cm (volume = 650 cm^3) Adrenals/Urinary Tract: Normal adrenal glands. Bilateral small renal cyst cysts. No suspicious renal lesion or correlate for the ultrasound  abnormality. No hydronephrosis. Stomach/Bowel: Normal stomach and colon. Apparent jejunal fold thickening including on 65/23, mild. Vascular/Lymphatic: Aortic atherosclerosis. No splenic artery aneurysm. Portal venous hypertension, with portosystemic collaterals in the left upper quadrant on 41/23. Patent portal and splenic veins. Patent SMV and hepatic veins. No abdominal adenopathy. Other:  Trace perihepatic and perisplenic ascites. Musculoskeletal: No acute osseous abnormality. IMPRESSION: 1. Mildly motion degraded exam. 2. Cirrhosis and portal venous  hypertension, without hepatocellular carcinoma. 3. Given motion degradation, no gallbladder mass or acute process. 4. At least 1 cystic lesion within the pancreatic head neck. Most likely a pseudocyst versus indolent cystic neoplasm. This could be re-evaluated on routine follow-up imaging for hepatocellular carcinoma surveillance. If this is not performed, follow-up with pre and post contrast abdominal MRI/MRCP at 1 year is recommended. This recommendation follows ACR consensus guidelines: Management of Incidental Pancreatic Cysts: A White Paper of the ACR Incidental Findings Committee. [place] 1308;[id number]. 5. Splenomegaly. 6.  Aortic Atherosclerosis (ICD10-I70.0). 7. Small-bowel fold thickening could simply be related to underdistention or portal venous hypertension. Recommend clinical correlation for enteritis. Electronically Signed   By: Abigail Miyamoto M.D.   On: 06/30/2020 20:40    ASSESSMENT: This is a very pleasant 60 years old white male with persistent microcytic anemia secondary to iron deficiency secondary to gastrointestinal blood loss.  The patient is intolerable to oral iron tablet.  He had upper endoscopy and colonoscopy that showed no clear etiology for his anemia except for esophagitis, gastritis and internal hemorrhoids.  He is scheduled to have a capsule endoscopy by [facility] gastroenterology.   PLAN: I had a lengthy discussion with the patient and his wife today about his current condition and treatment options. Repeat CBC today showed improvement of his hemoglobin up to 9.3 and hematocrit 29.5% with MCV of 79.5.  The patient also has low white blood count of 3.0 and low platelets count of 131,000. Iron studies showed low iron level of 33 with iron saturation of 9% and ferritin level of 27. I order several other studies including reticulocyte count, haptoglobin, vitamin B12 level as well as serum folate and erythropoietin level in addition to heavy metals. I will arrange for the  patient to receive iron infusion with Venofer 300 mg IV weekly for 3 weeks.  I will see the patient back for follow-up visit in around 6 weeks for evaluation and repeat CBC, iron study and ferritin for evaluation of his anemia. He will need to complete the gastrointestinal work-up with a capsule endoscopy to rule out any etiology for his persistent chronic blood loss. I also strongly advised the patient to quit alcohol drinking. For the hypertension he was advised to monitor his blood pressure closely at home and to report to his primary care physician for adjustment of his medication if needed. He was advised to call immediately if he has any other concerning symptoms in the interval. The patient voices understanding of current disease status and treatment options and is in agreement with the current care plan.  All questions were answered. The patient knows to call the clinic with any problems, questions or concerns. We can certainly see the patient much sooner if necessary.  Thank you so much for allowing me to participate in the care of Leroy Bell. I will continue to follow up the patient with you and assist in his care.  The total time spent in the appointment was 60 minutes.  Disclaimer: This note was dictated with voice recognition software. Similar sounding  words can inadvertently be transcribed and may not be corrected upon review.   Eilleen Kempf July 01, 2020, 12:04 PM

## 2020-07-02 LAB — ERYTHROPOIETIN: Erythropoietin: 42.2 m[IU]/mL — ABNORMAL HIGH (ref 2.6–18.5)

## 2020-07-02 LAB — HAPTOGLOBIN: Haptoglobin: 13 mg/dL — ABNORMAL LOW (ref 29–370)

## 2020-07-03 ENCOUNTER — Telehealth: Payer: Self-pay | Admitting: Internal Medicine

## 2020-07-03 LAB — HEAVY METALS, BLOOD
Arsenic: 2 ug/L (ref 0–9)
Lead: <1 ug/dL (ref 0–4)
Mercury: 5 ug/L (ref 0.0–14.9)

## 2020-07-03 NOTE — Telephone Encounter (Signed)
Called and spoke with patient. Scheduled 6week f/u per [facility]. Advised patient that I will call back once iron infusions are approved

## 2020-07-05 ENCOUNTER — Telehealth: Payer: Self-pay | Admitting: Internal Medicine

## 2020-07-05 ENCOUNTER — Inpatient Hospital Stay: Payer: Managed Care, Other (non HMO)

## 2020-07-05 NOTE — Telephone Encounter (Signed)
Patient missed this mornings appt. Called and rescheduled. Confirmed all upcoming appts

## 2020-07-12 ENCOUNTER — Other Ambulatory Visit: Payer: Self-pay

## 2020-07-12 ENCOUNTER — Inpatient Hospital Stay: Payer: Managed Care, Other (non HMO)

## 2020-07-12 VITALS — BP 153/71 | HR 84 | Temp 98.9°F | Resp 17

## 2020-07-12 DIAGNOSIS — D500 Iron deficiency anemia secondary to blood loss (chronic): Secondary | ICD-10-CM | POA: Diagnosis not present

## 2020-07-12 MED ORDER — SODIUM CHLORIDE 0.9 % IV SOLN
Freq: Once | INTRAVENOUS | Status: AC
  Filled 2020-07-12: qty 250

## 2020-07-12 MED ORDER — SODIUM CHLORIDE 0.9 % IV SOLN
300.0000 mg | Freq: Once | INTRAVENOUS | Status: AC
  Administered 2020-07-12: 300 mg via INTRAVENOUS
  Filled 2020-07-12: qty 10

## 2020-07-12 NOTE — Patient Instructions (Signed)

## 2020-07-15 ENCOUNTER — Ambulatory Visit
Admission: RE | Admit: 2020-07-15 | Discharge: 2020-07-15 | Disposition: A | Discharge status: Home / Self Care | Payer: Managed Care, Other (non HMO) | Source: Ambulatory Visit | Attending: Physician Assistant | Admitting: Physician Assistant

## 2020-07-15 ENCOUNTER — Other Ambulatory Visit: Payer: Self-pay | Admitting: Physician Assistant

## 2020-07-15 DIAGNOSIS — K5900 Constipation, unspecified: Secondary | ICD-10-CM

## 2020-07-19 ENCOUNTER — Other Ambulatory Visit: Payer: Self-pay

## 2020-07-19 ENCOUNTER — Inpatient Hospital Stay: Payer: Managed Care, Other (non HMO)

## 2020-07-19 VITALS — BP 156/69 | HR 80 | Temp 98.3°F | Resp 16

## 2020-07-19 DIAGNOSIS — D500 Iron deficiency anemia secondary to blood loss (chronic): Secondary | ICD-10-CM

## 2020-07-19 MED ORDER — SODIUM CHLORIDE 0.9 % IV SOLN
300.0000 mg | Freq: Once | INTRAVENOUS | Status: AC
  Administered 2020-07-19: 300 mg via INTRAVENOUS
  Filled 2020-07-19: qty 5

## 2020-07-19 MED ORDER — SODIUM CHLORIDE 0.9 % IV SOLN
Freq: Once | INTRAVENOUS | Status: AC
  Filled 2020-07-19: qty 250

## 2020-07-26 ENCOUNTER — Inpatient Hospital Stay: Payer: Managed Care, Other (non HMO) | Attending: Internal Medicine

## 2020-07-26 ENCOUNTER — Other Ambulatory Visit: Payer: Self-pay

## 2020-07-26 VITALS — BP 146/77 | HR 79 | Temp 98.5°F | Resp 18 | Wt 178.4 lb

## 2020-07-26 DIAGNOSIS — K922 Gastrointestinal hemorrhage, unspecified: Secondary | ICD-10-CM | POA: Insufficient documentation

## 2020-07-26 DIAGNOSIS — D500 Iron deficiency anemia secondary to blood loss (chronic): Secondary | ICD-10-CM | POA: Insufficient documentation

## 2020-07-26 MED ORDER — SODIUM CHLORIDE 0.9 % IV SOLN
Freq: Once | INTRAVENOUS | Status: AC
  Filled 2020-07-26: qty 250

## 2020-07-26 MED ORDER — SODIUM CHLORIDE 0.9 % IV SOLN
300.0000 mg | Freq: Once | INTRAVENOUS | Status: AC
  Administered 2020-07-26: 300 mg via INTRAVENOUS
  Filled 2020-07-26: qty 10

## 2020-07-26 NOTE — Patient Instructions (Signed)

## 2020-08-09 ENCOUNTER — Telehealth: Payer: Self-pay | Admitting: Internal Medicine

## 2020-08-09 NOTE — Telephone Encounter (Signed)
R/s appt per 4/15 sch msg. Pt aware.  

## 2020-08-14 ENCOUNTER — Inpatient Hospital Stay: Payer: Managed Care, Other (non HMO) | Admitting: Internal Medicine

## 2020-08-14 ENCOUNTER — Inpatient Hospital Stay: Payer: Managed Care, Other (non HMO)

## 2020-08-20 ENCOUNTER — Telehealth: Payer: Self-pay | Admitting: Internal Medicine

## 2020-08-20 NOTE — Telephone Encounter (Signed)
R/s 5/2 appt per provider request. Called and spoke with patient. Confirmed  New date and time

## 2020-08-26 ENCOUNTER — Inpatient Hospital Stay: Payer: Managed Care, Other (non HMO) | Admitting: Internal Medicine

## 2020-08-26 ENCOUNTER — Inpatient Hospital Stay: Payer: Managed Care, Other (non HMO)

## 2020-09-06 ENCOUNTER — Other Ambulatory Visit: Payer: Self-pay | Admitting: Medical Oncology

## 2020-09-06 DIAGNOSIS — D539 Nutritional anemia, unspecified: Secondary | ICD-10-CM

## 2020-09-09 ENCOUNTER — Other Ambulatory Visit: Payer: Self-pay

## 2020-09-09 ENCOUNTER — Inpatient Hospital Stay (HOSPITAL_BASED_OUTPATIENT_CLINIC_OR_DEPARTMENT_OTHER): Payer: Managed Care, Other (non HMO) | Admitting: Internal Medicine

## 2020-09-09 ENCOUNTER — Inpatient Hospital Stay: Payer: Managed Care, Other (non HMO) | Attending: Internal Medicine

## 2020-09-09 ENCOUNTER — Encounter: Payer: Self-pay | Admitting: Internal Medicine

## 2020-09-09 VITALS — BP 165/82 | HR 93 | Temp 97.0°F | Resp 20 | Ht 67.0 in | Wt 181.3 lb

## 2020-09-09 DIAGNOSIS — D539 Nutritional anemia, unspecified: Secondary | ICD-10-CM

## 2020-09-09 DIAGNOSIS — D61818 Other pancytopenia: Secondary | ICD-10-CM

## 2020-09-09 DIAGNOSIS — D500 Iron deficiency anemia secondary to blood loss (chronic): Secondary | ICD-10-CM | POA: Diagnosis present

## 2020-09-09 DIAGNOSIS — K922 Gastrointestinal hemorrhage, unspecified: Secondary | ICD-10-CM | POA: Insufficient documentation

## 2020-09-09 LAB — IRON AND TIBC
Iron: 17 ug/dL — ABNORMAL LOW (ref 42–163)
Saturation Ratios: 5 % — ABNORMAL LOW (ref 20–55)
TIBC: 339 ug/dL (ref 202–409)
UIBC: 321 ug/dL (ref 117–376)

## 2020-09-09 LAB — CBC WITH DIFFERENTIAL (CANCER CENTER ONLY)
Abs Immature Granulocytes: 0.01 10*3/uL (ref 0.00–0.07)
Basophils Absolute: 0 10*3/uL (ref 0.0–0.1)
Basophils Relative: 1 %
Eosinophils Absolute: 0.2 10*3/uL (ref 0.0–0.5)
Eosinophils Relative: 8 %
HCT: 29.6 % — ABNORMAL LOW (ref 39.0–52.0)
Hemoglobin: 9.4 g/dL — ABNORMAL LOW (ref 13.0–17.0)
Immature Granulocytes: 0 %
Lymphocytes Relative: 24 %
Lymphs Abs: 0.7 10*3/uL (ref 0.7–4.0)
MCH: 26.7 pg (ref 26.0–34.0)
MCHC: 31.8 g/dL (ref 30.0–36.0)
MCV: 84.1 fL (ref 80.0–100.0)
Monocytes Absolute: 0.4 10*3/uL (ref 0.1–1.0)
Monocytes Relative: 13 %
Neutro Abs: 1.5 10*3/uL — ABNORMAL LOW (ref 1.7–7.7)
Neutrophils Relative %: 54 %
Platelet Count: 91 10*3/uL — ABNORMAL LOW (ref 150–400)
RBC: 3.52 MIL/uL — ABNORMAL LOW (ref 4.22–5.81)
RDW: 18.8 % — ABNORMAL HIGH (ref 11.5–15.5)
WBC Count: 2.8 10*3/uL — ABNORMAL LOW (ref 4.0–10.5)
nRBC: 0 % (ref 0.0–0.2)

## 2020-09-09 LAB — CMP (CANCER CENTER ONLY)
ALT: 32 U/L (ref 0–44)
AST: 84 U/L — ABNORMAL HIGH (ref 15–41)
Albumin: 3.5 g/dL (ref 3.5–5.0)
Alkaline Phosphatase: 241 U/L — ABNORMAL HIGH (ref 38–126)
Anion gap: 7 (ref 5–15)
BUN: 5 mg/dL — ABNORMAL LOW (ref 6–20)
CO2: 23 mmol/L (ref 22–32)
Calcium: 8.6 mg/dL — ABNORMAL LOW (ref 8.9–10.3)
Chloride: 106 mmol/L (ref 98–111)
Creatinine: 0.77 mg/dL (ref 0.61–1.24)
GFR, Estimated: >60 mL/min (ref >60)
Glucose, Bld: 232 mg/dL — ABNORMAL HIGH (ref 70–99)
Potassium: 3.9 mmol/L (ref 3.5–5.1)
Sodium: 136 mmol/L (ref 135–145)
Total Bilirubin: 1.4 mg/dL — ABNORMAL HIGH (ref 0.3–1.2)
Total Protein: 7.1 g/dL (ref 6.5–8.1)

## 2020-09-09 LAB — FERRITIN: Ferritin: 44 ng/mL (ref 24–336)

## 2020-09-09 NOTE — Progress Notes (Signed)
[place] [phone]   [phone]  OFFICE PROGRESS NOTE  Patient, No Pcp Per (Inactive) No address on file  DIAGNOSIS: Microcytic anemia secondary to iron deficiency secondary to gastrointestinal blood loss.  The patient is intolerable to oral iron tablet.  PRIOR THERAPY: Iron infusion with Venofer 300 Mg IV weekly for 3 weeks  CURRENT THERAPY: None  INTERVAL HISTORY: Leroy Bell 60 y.o. male returns to the clinic today for follow-up visit.  The patient is feeling fine today with no concerning complaints except for mild fatigue.  He had extensive work-up for his anemia that were unremarkable except for mild decrease in haptoglobin but the LDH was normal.  He denied having any current chest pain, shortness of breath, cough or hemoptysis.  He denied having any fever or chills.  He has no nausea, vomiting, diarrhea or constipation.  He denied having any headache or visual changes.  He underwent iron infusion with Venofer 300 Mg IV weekly for 3 weeks.  The patient is here today for evaluation and repeat blood work.  MEDICAL HISTORY: Past Medical History:  Diagnosis Date  . Alcohol abuse   . Diabetes ([place])     ALLERGIES:  is allergic to penicillins.  MEDICATIONS:  Current Outpatient Medications  Medication Sig Dispense Refill  . Blood Glucose Monitoring Suppl (ONETOUCH VERIO FLEX SYSTEM) w/Device KIT Use to check blood sugars every morning fasting and 2 hours after largest meal 1 kit 0  . glucose blood test strip Use to check blood sugars every morning fasting and 2 hours after largest meal 100 each 12  . OneTouch Delica Lancets 54Y MISC Use to check blood sugars every morning fasting and 2 hours after largest meal 100 each 3   No current facility-administered medications for this visit.    SURGICAL HISTORY:  Past Surgical History:  Procedure Laterality Date  . NO PAST SURGERIES      REVIEW OF SYSTEMS:  A comprehensive review of  systems was negative except for: Constitutional: positive for fatigue   PHYSICAL EXAMINATION: General appearance: alert, appears stated age, fatigued and no distress Head: Normocephalic, without obvious abnormality, atraumatic Neck: no adenopathy, no JVD, supple, symmetrical, trachea midline and thyroid not enlarged, symmetric, no tenderness/mass/nodules Lymph nodes: Cervical, supraclavicular, and axillary nodes normal. Resp: clear to auscultation bilaterally Back: symmetric, no curvature. ROM normal. No CVA tenderness. Cardio: regular rate and rhythm, S1, S2 normal, no murmur, click, rub or gallop GI: soft, non-tender; bowel sounds normal; no masses,  no organomegaly Extremities: extremities normal, atraumatic, no cyanosis or edema  ECOG PERFORMANCE STATUS: 1 - Symptomatic but completely ambulatory  Blood pressure (!) 165/82, pulse 93, temperature (!) 97 F (36.1 C), temperature source Tympanic, resp. rate 20, height 5' 7"  (1.702 m), weight 181 lb 4.8 oz (82.2 kg), SpO2 100 %.  LABORATORY DATA: Lab Results  Component Value Date   WBC 3.0 (L) 07/01/2020   HGB 9.3 (L) 07/01/2020   HCT 29.5 (L) 07/01/2020   MCV 79.5 (L) 07/01/2020   PLT 131 (L) 07/01/2020      Chemistry      Component Value Date/Time   NA 129 (L) 07/01/2020 1117   NA 136 10/05/2018 0923   K 4.1 07/01/2020 1117   CL 100 07/01/2020 1117   CO2 23 07/01/2020 1117   BUN 6 07/01/2020 1117   BUN 6 10/05/2018 0923   CREATININE 0.97 07/01/2020 1117      Component Value Date/Time  CALCIUM 8.8 (L) 07/01/2020 1117   ALKPHOS 198 (H) 07/01/2020 1117   AST 51 (H) 07/01/2020 1117   ALT 29 07/01/2020 1117   BILITOT 1.7 (H) 07/01/2020 1117       RADIOGRAPHIC STUDIES: No results found.  ASSESSMENT AND PLAN: This is a very pleasant 60 years old white male with pancytopenia who was initially evaluated for iron deficiency anemia and received iron infusion with Venofer 300 Mg IV weekly for 3 weeks with no significant  improvement in his hemoglobin and hematocrit. His iron study and ferritin are still pending. I recommended for the patient to proceed with a bone marrow biopsy and aspirate to rule out any underlying bone marrow abnormality is responsible for his pancytopenia. The patient agreed to the current plan and he is expected to have the procedure done next week. I will see him back for follow-up visit in around 3 weeks for evaluation and discussion of his biopsy results and further recommendation regarding his condition. He was advised to call immediately if he has any concerning symptoms in the interval. The patient voices understanding of current disease status and treatment options and is in agreement with the current care plan.  All questions were answered. The patient knows to call the clinic with any problems, questions or concerns. We can certainly see the patient much sooner if necessary.   The total time spent in the appointment was 20 minutes.  Disclaimer: This note was dictated with voice recognition software. Similar sounding words can inadvertently be transcribed and may not be corrected upon review.

## 2020-09-12 ENCOUNTER — Telehealth: Payer: Self-pay | Admitting: Internal Medicine

## 2020-09-12 NOTE — Telephone Encounter (Signed)
Scheduled per los. Called and spoke with patient. Confirmed appt 

## 2020-09-18 ENCOUNTER — Other Ambulatory Visit: Payer: Self-pay | Admitting: Student

## 2020-09-19 ENCOUNTER — Other Ambulatory Visit: Payer: Self-pay | Admitting: Medical Oncology

## 2020-09-19 ENCOUNTER — Telehealth: Payer: Self-pay | Admitting: Medical Oncology

## 2020-09-19 ENCOUNTER — Encounter (HOSPITAL_COMMUNITY): Payer: Self-pay

## 2020-09-19 ENCOUNTER — Other Ambulatory Visit: Payer: Self-pay

## 2020-09-19 ENCOUNTER — Ambulatory Visit (HOSPITAL_COMMUNITY)
Admission: RE | Admit: 2020-09-19 | Discharge: 2020-09-19 | Disposition: A | Discharge status: Home / Self Care | Payer: Managed Care, Other (non HMO) | Source: Ambulatory Visit | Attending: Internal Medicine | Admitting: Internal Medicine

## 2020-09-19 DIAGNOSIS — D61818 Other pancytopenia: Secondary | ICD-10-CM | POA: Diagnosis present

## 2020-09-19 DIAGNOSIS — D500 Iron deficiency anemia secondary to blood loss (chronic): Secondary | ICD-10-CM

## 2020-09-19 DIAGNOSIS — D7589 Other specified diseases of blood and blood-forming organs: Secondary | ICD-10-CM | POA: Diagnosis not present

## 2020-09-19 HISTORY — DX: Dyspnea, unspecified: R06.00

## 2020-09-19 HISTORY — DX: Essential (primary) hypertension: I10

## 2020-09-19 LAB — CBC WITH DIFFERENTIAL/PLATELET
Abs Immature Granulocytes: 0.01 10*3/uL (ref 0.00–0.07)
Basophils Absolute: 0 10*3/uL (ref 0.0–0.1)
Basophils Relative: 0 %
Eosinophils Absolute: 0.1 10*3/uL (ref 0.0–0.5)
Eosinophils Relative: 2 %
HCT: 22.5 % — ABNORMAL LOW (ref 39.0–52.0)
Hemoglobin: 7 g/dL — ABNORMAL LOW (ref 13.0–17.0)
Immature Granulocytes: 0 %
Lymphocytes Relative: 20 %
Lymphs Abs: 0.6 10*3/uL — ABNORMAL LOW (ref 0.7–4.0)
MCH: 26.6 pg (ref 26.0–34.0)
MCHC: 31.1 g/dL (ref 30.0–36.0)
MCV: 85.6 fL (ref 80.0–100.0)
Monocytes Absolute: 0.5 10*3/uL (ref 0.1–1.0)
Monocytes Relative: 16 %
Neutro Abs: 1.8 10*3/uL (ref 1.7–7.7)
Neutrophils Relative %: 62 %
Platelets: 109 10*3/uL — ABNORMAL LOW (ref 150–400)
RBC: 2.63 MIL/uL — ABNORMAL LOW (ref 4.22–5.81)
RDW: 18.6 % — ABNORMAL HIGH (ref 11.5–15.5)
WBC: 3 10*3/uL — ABNORMAL LOW (ref 4.0–10.5)
nRBC: 0 % (ref 0.0–0.2)

## 2020-09-19 LAB — GLUCOSE, CAPILLARY: Glucose-Capillary: 156 mg/dL — ABNORMAL HIGH (ref 70–99)

## 2020-09-19 MED ORDER — LIDOCAINE HCL 1 % IJ SOLN
INTRAMUSCULAR | Status: AC | PRN
  Administered 2020-09-19: 10 mL via INTRADERMAL

## 2020-09-19 MED ORDER — FENTANYL CITRATE (PF) 100 MCG/2ML IJ SOLN
INTRAMUSCULAR | Status: AC | PRN
  Administered 2020-09-19 (×2): 50 ug via INTRAVENOUS

## 2020-09-19 MED ORDER — HYDROCODONE-ACETAMINOPHEN 5-325 MG PO TABS: 1.0000 | ORAL_TABLET | ORAL | Status: DC | PRN

## 2020-09-19 MED ORDER — FENTANYL CITRATE (PF) 100 MCG/2ML IJ SOLN
INTRAMUSCULAR | Status: AC
  Filled 2020-09-19: qty 4

## 2020-09-19 MED ORDER — MIDAZOLAM HCL 2 MG/2ML IJ SOLN
INTRAMUSCULAR | Status: AC
  Filled 2020-09-19: qty 4

## 2020-09-19 MED ORDER — FLUMAZENIL 0.5 MG/5ML IV SOLN
INTRAVENOUS | Status: AC
  Filled 2020-09-19: qty 5

## 2020-09-19 MED ORDER — SODIUM CHLORIDE 0.9 % IV SOLN: INTRAVENOUS | Status: DC

## 2020-09-19 MED ORDER — NALOXONE HCL 0.4 MG/ML IJ SOLN
INTRAMUSCULAR | Status: AC
  Filled 2020-09-19: qty 1

## 2020-09-19 MED ORDER — MIDAZOLAM HCL 2 MG/2ML IJ SOLN
INTRAMUSCULAR | Status: AC | PRN
  Administered 2020-09-19 (×3): 1 mg via INTRAVENOUS

## 2020-09-19 NOTE — Progress Notes (Signed)
Pt states he normally takes metformin but ran out an does't currently have PCP.  Encouraged to notify Dr Alba Destine or health dept.

## 2020-09-19 NOTE — Discharge Instructions (Signed)
Urgent needs - Interventional Radiology on call MD [phone]  Wound - May remove dressing and shower tomorrow.    Keep site clean and dry.  Replace with bandaid as needed.  Do not submerge in tub or water until site healing well. If closed with glue, glue will flake off on its own.  I Bone Marrow Aspiration and Bone Marrow Biopsy, Adult, Care After This sheet gives you information about how to care for yourself after your procedure. Your health care provider may also give you more specific instructions. If you have problems or questions, contact your health care provider. What can I expect after the procedure? After the procedure, it is common to have:  Mild pain and tenderness.  Swelling.  Bruising. Follow these instructions at home: Puncture site care  Follow instructions from your health care provider about how to take care of the puncture site. Make sure you: ? Wash your hands with soap and water before and after you change your bandage (dressing). If soap and water are not available, use hand sanitizer. ? Change your dressing as told by your health care provider.  Check your puncture site every day for signs of infection. Check for: ? More redness, swelling, or pain. ? Fluid or blood. ? Warmth. ? Pus or a bad smell.   Activity  Return to your normal activities as told by your health care provider. Ask your health care provider what activities are safe for you.  Do not lift anything that is heavier than 10 lb (4.5 kg), or the limit that you are told, until your health care provider says that it is safe.  Do not drive for 24 hours if you were given a sedative during your procedure. General instructions  Take over-the-counter and prescription medicines only as told by your health care provider.  Do not take baths, swim, or use a hot tub until your health care provider approves. Ask your health care provider if you may take showers. You may only be allowed to take sponge  baths.  If directed, put ice on the affected area. To do this: ? Put ice in a plastic bag. ? Place a towel between your skin and the bag. ? Leave the ice on for 20 minutes, 2-3 times a day.  Keep all follow-up visits as told by your health care provider. This is important.   Contact a health care provider if:  Your pain is not controlled with medicine.  You have a fever.  You have more redness, swelling, or pain around the puncture site.  You have fluid or blood coming from the puncture site.  Your puncture site feels warm to the touch.  You have pus or a bad smell coming from the puncture site. Summary  After the procedure, it is common to have mild pain, tenderness, swelling, and bruising.  Follow instructions from your health care provider about how to take care of the puncture site and what activities are safe for you.  Take over-the-counter and prescription medicines only as told by your health care provider.  Contact a health care provider if you have any signs of infection, such as fluid or blood coming from the puncture site. This information is not intended to replace advice given to you by your health care provider. Make sure you discuss any questions you have with your health care provider. Document Revised: 08/30/2018 Document Reviewed: 08/30/2018 Elsevier Patient Education  2021 [employer].   Moderate Conscious Sedation, Adult, Care After This sheet  gives you information about how to care for yourself after your procedure. Your health care provider may also give you more specific instructions. If you have problems or questions, contact your health care provider. What can I expect after the procedure? After the procedure, it is common to have:  Sleepiness for several hours.  Impaired judgment for several hours.  Difficulty with balance.  Vomiting if you eat too soon. Follow these instructions at home: For the time period you were told by your health care  provider:  Rest.  Do not participate in activities where you could fall or become injured.  Do not drive or use machinery.  Do not drink alcohol.  Do not take sleeping pills or medicines that cause drowsiness.  Do not make important decisions or sign legal documents.  Do not take care of children on your own.      Eating and drinking  Follow the diet recommended by your health care provider.  Drink enough fluid to keep your urine pale yellow.  If you vomit: ? Drink water, juice, or soup when you can drink without vomiting. ? Make sure you have little or no nausea before eating solid foods.   General instructions  Take over-the-counter and prescription medicines only as told by your health care provider.  Have a responsible adult stay with you for the time you are told. It is important to have someone help care for you until you are awake and alert.  Do not smoke.  Keep all follow-up visits as told by your health care provider. This is important. Contact a health care provider if:  You are still sleepy or having trouble with balance after 24 hours.  You feel light-headed.  You keep feeling nauseous or you keep vomiting.  You develop a rash.  You have a fever.  You have redness or swelling around the IV site. Get help right away if:  You have trouble breathing.  You have new-onset confusion at home. Summary  After the procedure, it is common to feel sleepy, have impaired judgment, or feel nauseous if you eat too soon.  Rest after you get home. Know the things you should not do after the procedure.  Follow the diet recommended by your health care provider and drink enough fluid to keep your urine pale yellow.  Get help right away if you have trouble breathing or new-onset confusion at home. This information is not intended to replace advice given to you by your health care provider. Make sure you discuss any questions you have with your health care  provider. Document Revised: 08/11/2019 Document Reviewed: 03/09/2019 Elsevier Patient Education  2021 [employer].

## 2020-09-19 NOTE — H&P (Signed)
Chief Complaint: Patient was seen in consultation today for pancytopenia/bone marrow biopsy and aspiration.  Referring Physician(s): [facility] (hematology/oncology)  Supervising Physician: Sandi Mariscal  Patient Status: [facility] - Out-pt  History of Present Illness: Leroy Bell is a 60 y.o. male with a past medical history of diabetes mellitus, iron deficiency anemia, and alcohol abuse. He was originally referred to hematology/oncology for management of iron deficiency anemia. Further work-up revealed pancytopenia without known etiology.  IR consulted by Dr. Julien Nordmann for possible image-guided bone marrow biopsy/aspiration. Patient awake and alert laying in bed. States he is anxious for procedure- otherwise no complaints. Denies fever, chills, chest pain, dyspnea, abdominal pain, or headache.   Past Medical History:  Diagnosis Date  . Alcohol abuse   . Diabetes ([place])   . Dyspnea    from anemia  . Hypertension     Past Surgical History:  Procedure Laterality Date  . NO PAST SURGERIES      Allergies: Penicillins  Medications: Prior to Admission medications   Medication Sig Start Date End Date Taking? Authorizing Provider  Blood Glucose Monitoring Suppl ([place]) w/Device KIT Use to check blood sugars every morning fasting and 2 hours after largest meal 09/08/18   Danford, Katy D, NP  glucose blood test strip Use to check blood sugars every morning fasting and 2 hours after largest meal 12/13/18   Danford, Katy D, NP  OneTouch Delica Lancets 97W MISC Use to check blood sugars every morning fasting and 2 hours after largest meal 01/03/19   Danford, Valetta Fuller D, NP     Family History  Problem Relation Age of Onset  . Diabetes Mother   . Hypertension Mother   . Diabetes Father   . Hypertension Father   . Heart attack Father   . Prostate cancer Father   . Diabetes Sister   . Healthy Daughter   . Healthy Son   . Diabetes Paternal Grandmother   .  Diabetes Paternal Grandfather   . Cancer Paternal Grandfather        unknown     Social History   Socioeconomic History  . Marital status: Married    Spouse name: Not on file  . Number of children: Not on file  . Years of education: Not on file  . Highest education level: Not on file  Occupational History  . Not on file  Tobacco Use  . Smoking status: Never Smoker  . Smokeless tobacco: Current User    Types: Snuff  Vaping Use  . Vaping Use: Never used  Substance and Sexual Activity  . Alcohol use: Not Currently    Comment: stopped on 07/24/18  . Drug use: Not Currently  . Sexual activity: Yes    Birth control/protection: None  Other Topics Concern  . Not on file  Social History Narrative  . Not on file   Social Determinants of Health   Financial Resource Strain: Not on file  Food Insecurity: Not on file  Transportation Needs: Not on file  Physical Activity: Not on file  Stress: Not on file  Social Connections: Not on file     Review of Systems: A 12 point ROS discussed and pertinent positives are indicated in the HPI above.  All other systems are negative.  Review of Systems  Constitutional: Negative for chills and fever.  Respiratory: Negative for shortness of breath and wheezing.   Cardiovascular: Negative for chest pain and palpitations.  Gastrointestinal: Negative for abdominal pain.  Neurological: Negative for  headaches.  Psychiatric/Behavioral: Negative for behavioral problems and confusion.    Vital Signs: BP (!) 190/76 (BP Location: Right Arm)   Pulse (!) 101   Temp 98.8 F (37.1 C) (Oral)   Resp 20   SpO2 99%   Physical Exam Vitals and nursing note reviewed.  Constitutional:      General: He is not in acute distress. Cardiovascular:     Rate and Rhythm: Normal rate and regular rhythm.     Heart sounds: Normal heart sounds. No murmur heard.   Pulmonary:     Effort: Pulmonary effort is normal. No respiratory distress.     Breath sounds:  Normal breath sounds. No wheezing.  Skin:    General: Skin is warm and dry.  Neurological:     Mental Status: He is alert and oriented to person, place, and time.      MD Evaluation Airway: WNL Heart: WNL Abdomen: WNL Chest/ Lungs: WNL ASA  Classification: 2 Mallampati/Airway Score: Two   Imaging: No results found.  Labs:  CBC: Recent Labs    04/25/20 2049 07/01/20 1117 09/09/20 0837  WBC 6.5 3.0* 2.8*  HGB 9.4* 9.3* 9.4*  HCT 27.2* 29.5* 29.6*  PLT 126* 131* 91*    COAGS: No results for input(s): INR, APTT in the last 8760 hours.  BMP: Recent Labs    04/25/20 2049 07/01/20 1117 09/09/20 0837  NA 131* 129* 136  K 3.6 4.1 3.9  CL 97* 100 106  CO2 24 23 23   GLUCOSE 340* 299* 232*  BUN 6 6 5*  CALCIUM 9.0 8.8* 8.6*  CREATININE 0.82 0.97 0.77  GFRNONAA >60 >60 >60    LIVER FUNCTION TESTS: Recent Labs    04/25/20 2049 07/01/20 1117 09/09/20 0837  BILITOT 2.0* 1.7* 1.4*  AST 68* 51* 84*  ALT 34 29 32  ALKPHOS 178* 198* 241*  PROT 6.1* 7.3 7.1  ALBUMIN 3.0* 3.6 3.5     Assessment and Plan:  Pancytopenia without known etiology. Plan for image-guided bone marrow biopsy/aspiration today in IR. Patient is NPO. Afebrile. CBC with differential ordered for this AM.  Risks and benefits discussed with the patient including, but not limited to bleeding, infection, damage to adjacent structures or low yield requiring additional tests. All of the patient's questions were answered, patient is agreeable to proceed. Consent signed and in chart.   Thank you for this interesting consult.  I greatly enjoyed [place] and look forward to participating in their care.  A copy of this report was sent to the requesting provider on this date.  Electronically Signed: Earley Abide, PA-C 09/19/2020, 9:55 AM   I spent a total of 15 Minutes in face to face in clinical consultation, greater than 50% of which was counseling/coordinating care for  pancytopenia/bone marrow biopsy and aspiration.

## 2020-09-19 NOTE — Procedures (Signed)
  Procedure: CT bone marrow biopsy r iliac EBL:   minimal Complications:  none immediate  See full dictation in [facility].  Dillard Cannon MD Main # [phone] Pager  [phone] Mobile [phone]

## 2020-09-19 NOTE — Telephone Encounter (Signed)
Wife called today and said hgb 7.  Per Dr Arbutus Ped schedule one unit of blood  for Friday or Saturday. The patient was notified and given appt for labs tomorrow. I told him to check with the scheduler or Mohamed;'s nurse tomorrow for appt if he doesn't hear today .

## 2020-09-20 ENCOUNTER — Other Ambulatory Visit: Payer: Self-pay | Admitting: Physician Assistant

## 2020-09-20 ENCOUNTER — Inpatient Hospital Stay: Payer: Managed Care, Other (non HMO)

## 2020-09-20 ENCOUNTER — Other Ambulatory Visit: Payer: Self-pay

## 2020-09-20 DIAGNOSIS — D500 Iron deficiency anemia secondary to blood loss (chronic): Secondary | ICD-10-CM

## 2020-09-20 LAB — PREPARE RBC (CROSSMATCH)

## 2020-09-21 ENCOUNTER — Inpatient Hospital Stay: Payer: Managed Care, Other (non HMO)

## 2020-09-21 ENCOUNTER — Other Ambulatory Visit: Payer: Self-pay

## 2020-09-21 DIAGNOSIS — D500 Iron deficiency anemia secondary to blood loss (chronic): Secondary | ICD-10-CM | POA: Diagnosis not present

## 2020-09-21 MED ORDER — DIPHENHYDRAMINE HCL 25 MG PO CAPS
25.0000 mg | ORAL_CAPSULE | Freq: Once | ORAL | Status: AC
  Administered 2020-09-21: 25 mg via ORAL

## 2020-09-21 MED ORDER — SODIUM CHLORIDE 0.9% IV SOLUTION
250.0000 mL | Freq: Once | INTRAVENOUS | Status: AC
  Administered 2020-09-21: 250 mL via INTRAVENOUS
  Filled 2020-09-21: qty 250

## 2020-09-21 MED ORDER — ACETAMINOPHEN 325 MG PO TABS
ORAL_TABLET | ORAL | Status: AC
  Filled 2020-09-21: qty 2

## 2020-09-21 MED ORDER — ACETAMINOPHEN 325 MG PO TABS
650.0000 mg | ORAL_TABLET | Freq: Once | ORAL | Status: AC
  Administered 2020-09-21: 650 mg via ORAL

## 2020-09-21 MED ORDER — DIPHENHYDRAMINE HCL 25 MG PO CAPS
ORAL_CAPSULE | ORAL | Status: AC
  Filled 2020-09-21: qty 1

## 2020-09-21 NOTE — Patient Instructions (Signed)

## 2020-09-23 LAB — TYPE AND SCREEN
ABO/RH(D): O POS
Antibody Screen: NEGATIVE
Unit division: 0

## 2020-09-23 LAB — BPAM RBC
Blood Product Expiration Date: 202206282359
ISSUE DATE / TIME: 202205281029
Unit Type and Rh: 5100

## 2020-09-26 ENCOUNTER — Encounter (HOSPITAL_COMMUNITY): Payer: Self-pay | Admitting: Internal Medicine

## 2020-10-01 ENCOUNTER — Other Ambulatory Visit: Payer: Self-pay

## 2020-10-01 ENCOUNTER — Inpatient Hospital Stay: Payer: Managed Care, Other (non HMO) | Attending: Internal Medicine | Admitting: Internal Medicine

## 2020-10-01 ENCOUNTER — Inpatient Hospital Stay: Payer: Managed Care, Other (non HMO)

## 2020-10-01 ENCOUNTER — Other Ambulatory Visit: Payer: Self-pay | Admitting: Medical Oncology

## 2020-10-01 ENCOUNTER — Encounter (HOSPITAL_COMMUNITY): Payer: Self-pay | Admitting: Internal Medicine

## 2020-10-01 VITALS — BP 182/83 | HR 97 | Temp 98.0°F | Resp 19 | Ht 67.0 in | Wt 187.2 lb

## 2020-10-01 DIAGNOSIS — K922 Gastrointestinal hemorrhage, unspecified: Secondary | ICD-10-CM | POA: Insufficient documentation

## 2020-10-01 DIAGNOSIS — D539 Nutritional anemia, unspecified: Secondary | ICD-10-CM

## 2020-10-01 DIAGNOSIS — I10 Essential (primary) hypertension: Secondary | ICD-10-CM | POA: Diagnosis not present

## 2020-10-01 DIAGNOSIS — D500 Iron deficiency anemia secondary to blood loss (chronic): Secondary | ICD-10-CM | POA: Diagnosis present

## 2020-10-01 DIAGNOSIS — F1010 Alcohol abuse, uncomplicated: Secondary | ICD-10-CM | POA: Diagnosis not present

## 2020-10-01 LAB — CBC WITH DIFFERENTIAL (CANCER CENTER ONLY)
Abs Immature Granulocytes: 0.02 10*3/uL (ref 0.00–0.07)
Basophils Absolute: 0 10*3/uL (ref 0.0–0.1)
Basophils Relative: 1 %
Eosinophils Absolute: 0.1 10*3/uL (ref 0.0–0.5)
Eosinophils Relative: 2 %
HCT: 25.8 % — ABNORMAL LOW (ref 39.0–52.0)
Hemoglobin: 8 g/dL — ABNORMAL LOW (ref 13.0–17.0)
Immature Granulocytes: 1 %
Lymphocytes Relative: 17 %
Lymphs Abs: 0.7 10*3/uL (ref 0.7–4.0)
MCH: 25.8 pg — ABNORMAL LOW (ref 26.0–34.0)
MCHC: 31 g/dL (ref 30.0–36.0)
MCV: 83.2 fL (ref 80.0–100.0)
Monocytes Absolute: 0.5 10*3/uL (ref 0.1–1.0)
Monocytes Relative: 10 %
Neutro Abs: 3.1 10*3/uL (ref 1.7–7.7)
Neutrophils Relative %: 69 %
Platelet Count: 116 10*3/uL — ABNORMAL LOW (ref 150–400)
RBC: 3.1 MIL/uL — ABNORMAL LOW (ref 4.22–5.81)
RDW: 16.8 % — ABNORMAL HIGH (ref 11.5–15.5)
WBC Count: 4.4 10*3/uL (ref 4.0–10.5)
nRBC: 0 % (ref 0.0–0.2)

## 2020-10-01 LAB — PREPARE RBC (CROSSMATCH)

## 2020-10-01 NOTE — Progress Notes (Signed)
[place] [phone]   Fax:([phone]  OFFICE PROGRESS NOTE  Patient, No Pcp Per (Inactive) No address on file  DIAGNOSIS: Microcytic anemia secondary to iron deficiency secondary to gastrointestinal blood loss.  The patient is intolerable to oral iron tablet. Bone marrow biopsy and aspirate showed no concerning underlying etiology.  PRIOR THERAPY: Iron infusion with Venofer 300 Mg IV weekly for 3 weeks  CURRENT THERAPY: None  INTERVAL HISTORY: Leroy Bell 60 y.o. male returns to the clinic today for follow-up visit accompanied by his wife.  The patient continues to complain of fatigue and weakness as well as shortness of breath with exertion.  He is feeling a little bit better today.  He denied having any current chest pain or hemoptysis.  He has no nausea, vomiting, diarrhea or constipation.  He has no headache or visual changes.  He underwent a bone marrow biopsy and aspirate for evaluation of the pancytopenia and he is here today for evaluation and recommendation regarding his condition.  MEDICAL HISTORY: Past Medical History:  Diagnosis Date  . Alcohol abuse   . Diabetes ([place])   . Dyspnea    from anemia  . Hypertension     ALLERGIES:  is allergic to penicillins.  MEDICATIONS:  Current Outpatient Medications  Medication Sig Dispense Refill  . Blood Glucose Monitoring Suppl (ONETOUCH VERIO FLEX SYSTEM) w/Device KIT Use to check blood sugars every morning fasting and 2 hours after largest meal 1 kit 0  . glucose blood test strip Use to check blood sugars every morning fasting and 2 hours after largest meal 100 each 12  . OneTouch Delica Lancets 63A MISC Use to check blood sugars every morning fasting and 2 hours after largest meal 100 each 3   No current facility-administered medications for this visit.    SURGICAL HISTORY:  Past Surgical History:  Procedure Laterality Date  . NO PAST SURGERIES      REVIEW OF SYSTEMS:   Constitutional: positive for fatigue Eyes: negative Ears, nose, mouth, throat, and face: negative Respiratory: positive for dyspnea on exertion Cardiovascular: negative Gastrointestinal: negative Genitourinary:negative Integument/breast: negative Hematologic/lymphatic: negative Musculoskeletal:negative Neurological: negative Behavioral/Psych: negative Endocrine: negative Allergic/Immunologic: negative   PHYSICAL EXAMINATION: General appearance: alert, appears stated age, fatigued and no distress Head: Normocephalic, without obvious abnormality, atraumatic Neck: no adenopathy, no JVD, supple, symmetrical, trachea midline and thyroid not enlarged, symmetric, no tenderness/mass/nodules Lymph nodes: Cervical, supraclavicular, and axillary nodes normal. Resp: clear to auscultation bilaterally Back: symmetric, no curvature. ROM normal. No CVA tenderness. Cardio: regular rate and rhythm, S1, S2 normal, no murmur, click, rub or gallop GI: soft, non-tender; bowel sounds normal; no masses,  no organomegaly Extremities: extremities normal, atraumatic, no cyanosis or edema Neurologic: Alert and oriented X 3, normal strength and tone. Normal symmetric reflexes. Normal coordination and gait  ECOG PERFORMANCE STATUS: 1 - Symptomatic but completely ambulatory  Blood pressure (!) 182/83, pulse 97, temperature 98 F (36.7 C), temperature source Tympanic, resp. rate 19, height 5' 7"  (1.702 m), weight 187 lb 3.2 oz (84.9 kg), SpO2 100 %.  LABORATORY DATA: Lab Results  Component Value Date   WBC 4.4 10/01/2020   HGB 8.0 (L) 10/01/2020   HCT 25.8 (L) 10/01/2020   MCV 83.2 10/01/2020   PLT 116 (L) 10/01/2020      Chemistry      Component Value Date/Time   NA 136 09/09/2020 0837   NA 136 10/05/2018 0923   K 3.9 09/09/2020 4536  CL 106 09/09/2020 0837   CO2 23 09/09/2020 0837   BUN 5 (L) 09/09/2020 0837   BUN 6 10/05/2018 0923   CREATININE 0.77 09/09/2020 0837      Component Value  Date/Time   CALCIUM 8.6 (L) 09/09/2020 0837   ALKPHOS 241 (H) 09/09/2020 0837   AST 84 (H) 09/09/2020 0837   ALT 32 09/09/2020 0837   BILITOT 1.4 (H) 09/09/2020 0837       RADIOGRAPHIC STUDIES: CT Biopsy  Result Date: 2020/10/07 CLINICAL DATA:  Pancytopenia EXAM: CT GUIDED DEEP ILIAC BONE ASPIRATION AND CORE BIOPSY TECHNIQUE: Patient was placed prone on the CT gantry and limited axial scans through the pelvis were obtained. Appropriate skin entry site was identified. Skin site was marked, prepped with chlorhexidine, draped in usual sterile fashion, and infiltrated locally with 1% lidocaine. Intravenous Fentanyl 160mg and Versed 311mwere administered as conscious sedation during continuous monitoring of the patient's level of consciousness and physiological / cardiorespiratory status by the radiology RN, with a total moderate sedation time of 11 minutes. Under CT fluoroscopic guidance an 11-gauge Cook trocar bone needle was advanced into the right iliac bone just lateral to the sacroiliac joint. Once needle tip position was confirmed, core and aspiration samples were obtained, submitted to pathology for approval. Post procedure scans show no hematoma or fracture. Patient tolerated procedure well. COMPLICATIONS: COMPLICATIONS none IMPRESSION: 1. Technically successful CT guided right iliac bone core and aspiration biopsy. Electronically Signed   By: D Lucrezia Europe.D.   On: [id number]:13   CT BONE MARROW BIOPSY & ASPIRATION  Result Date: 08/2020-06-13LINICAL DATA:  Pancytopenia EXAM: CT GUIDED DEEP ILIAC BONE ASPIRATION AND CORE BIOPSY TECHNIQUE: Patient was placed prone on the CT gantry and limited axial scans through the pelvis were obtained. Appropriate skin entry site was identified. Skin site was marked, prepped with chlorhexidine, draped in usual sterile fashion, and infiltrated locally with 1% lidocaine. Intravenous Fentanyl 1009mand Versed 3mg78mre administered as conscious sedation during  continuous monitoring of the patient's level of consciousness and physiological / cardiorespiratory status by the radiology RN, with a total moderate sedation time of 11 minutes. Under CT fluoroscopic guidance an 11-gauge Cook trocar bone needle was advanced into the right iliac bone just lateral to the sacroiliac joint. Once needle tip position was confirmed, core and aspiration samples were obtained, submitted to pathology for approval. Post procedure scans show no hematoma or fracture. Patient tolerated procedure well. COMPLICATIONS: COMPLICATIONS none IMPRESSION: 1. Technically successful CT guided right iliac bone core and aspiration biopsy. Electronically Signed   By: D  HLucrezia Europe.   On: [id number]    ASSESSMENT AND PLAN: This is a very pleasant 59 y109rs old white male with pancytopenia who was initially evaluated for iron deficiency anemia and received iron infusion with Venofer 300 Mg IV weekly for 3 weeks with no significant improvement in his hemoglobin and hematocrit. The patient had a bone marrow biopsy and aspirate performed recently that showed slightly hypercellular bone marrow for age with trilineage hematopoiesis.  The biopsy showed no feature specific or diagnostic of a primary myeloid neoplasm and may be secondary in nature as a result of nutritional deficiency, medication, alcohol, infection or immune mediated process.  The patient has a very well-known history of alcohol abuse.  I recommended for the patient to receive 2 units of PRBCs transfusion in the next few days. For the iron deficiency, I will arrange for the patient to have iron infusion with Venofer 300  Mg IV weekly for 3 weeks.  I will see him back for follow-up visit in around 6 weeks for reevaluation and repeat blood work. He was also advised to cut on the alcohol drinking. For the hypertension, he was very anxious today.  The patient is not on any blood pressure medication right now. I strongly recommend for the  patient to establish care with a primary care physician for management of his hypertension. He was advised to call immediately if he has any concerning symptoms in the interval.  The patient voices understanding of current disease status and treatment options and is in agreement with the current care plan.  All questions were answered. The patient knows to call the clinic with any problems, questions or concerns. We can certainly see the patient much sooner if necessary.   The total time spent in the appointment was 30 minutes.  Disclaimer: This note was dictated with voice recognition software. Similar sounding words can inadvertently be transcribed and may not be corrected upon review.

## 2020-10-01 NOTE — Progress Notes (Unsigned)
Pt instructed to keep on armband for blood transfusion tomorrow.

## 2020-10-02 ENCOUNTER — Inpatient Hospital Stay: Payer: Managed Care, Other (non HMO)

## 2020-10-02 DIAGNOSIS — D539 Nutritional anemia, unspecified: Secondary | ICD-10-CM

## 2020-10-02 DIAGNOSIS — D500 Iron deficiency anemia secondary to blood loss (chronic): Secondary | ICD-10-CM | POA: Diagnosis not present

## 2020-10-02 LAB — IRON AND TIBC
Iron: 15 ug/dL — ABNORMAL LOW (ref 42–163)
Saturation Ratios: 5 % — ABNORMAL LOW (ref 20–55)
TIBC: 331 ug/dL (ref 202–409)
UIBC: 316 ug/dL (ref 117–376)

## 2020-10-02 LAB — FERRITIN: Ferritin: 26 ng/mL (ref 24–336)

## 2020-10-02 MED ORDER — DIPHENHYDRAMINE HCL 25 MG PO CAPS
25.0000 mg | ORAL_CAPSULE | Freq: Once | ORAL | Status: AC
  Administered 2020-10-02: 25 mg via ORAL

## 2020-10-02 MED ORDER — ACETAMINOPHEN 325 MG PO TABS
ORAL_TABLET | ORAL | Status: AC
  Filled 2020-10-02: qty 2

## 2020-10-02 MED ORDER — DIPHENHYDRAMINE HCL 25 MG PO CAPS
ORAL_CAPSULE | ORAL | Status: AC
  Filled 2020-10-02: qty 1

## 2020-10-02 MED ORDER — ACETAMINOPHEN 325 MG PO TABS
650.0000 mg | ORAL_TABLET | Freq: Once | ORAL | Status: AC
  Administered 2020-10-02: 650 mg via ORAL

## 2020-10-02 MED ORDER — SODIUM CHLORIDE 0.9% IV SOLUTION
250.0000 mL | Freq: Once | INTRAVENOUS | Status: AC
Start: 2020-10-02 — End: 2020-10-02
  Administered 2020-10-02: 250 mL via INTRAVENOUS
  Filled 2020-10-02: qty 250

## 2020-10-02 NOTE — Patient Instructions (Signed)

## 2020-10-03 LAB — BPAM RBC
Blood Product Expiration Date: 202206142359
Blood Product Expiration Date: 202207092359
ISSUE DATE / TIME: 202206080747
ISSUE DATE / TIME: 202206080747
Unit Type and Rh: 5100
Unit Type and Rh: 5100

## 2020-10-03 LAB — TYPE AND SCREEN
ABO/RH(D): O POS
Antibody Screen: NEGATIVE
Unit division: 0
Unit division: 0

## 2020-10-04 ENCOUNTER — Telehealth: Payer: Self-pay

## 2020-10-10 ENCOUNTER — Inpatient Hospital Stay: Payer: Managed Care, Other (non HMO)

## 2020-10-10 ENCOUNTER — Telehealth: Payer: Self-pay

## 2020-10-14 LAB — SURGICAL PATHOLOGY

## 2020-10-17 ENCOUNTER — Inpatient Hospital Stay: Payer: Managed Care, Other (non HMO)

## 2020-10-17 ENCOUNTER — Telehealth: Payer: Self-pay

## 2020-10-17 NOTE — Telephone Encounter (Signed)
Spoke with pt regarding his upcoming appt on 10/18/20. Pt states that he is still testing positive for COVID 19. Verbalizes understanding that scheduling will reach out to reschedule his next appt.

## 2020-10-18 ENCOUNTER — Inpatient Hospital Stay: Payer: Managed Care, Other (non HMO)

## 2020-10-23 ENCOUNTER — Other Ambulatory Visit: Payer: Self-pay

## 2020-10-23 ENCOUNTER — Inpatient Hospital Stay: Payer: Managed Care, Other (non HMO)

## 2020-10-23 VITALS — BP 163/71 | HR 83 | Temp 98.6°F | Resp 18

## 2020-10-23 DIAGNOSIS — D500 Iron deficiency anemia secondary to blood loss (chronic): Secondary | ICD-10-CM | POA: Diagnosis not present

## 2020-10-23 MED ORDER — SODIUM CHLORIDE 0.9 % IV SOLN
Freq: Once | INTRAVENOUS | Status: AC
  Filled 2020-10-23: qty 250

## 2020-10-23 MED ORDER — SODIUM CHLORIDE 0.9 % IV SOLN
300.0000 mg | Freq: Once | INTRAVENOUS | Status: AC
  Administered 2020-10-23: 300 mg via INTRAVENOUS
  Filled 2020-10-23: qty 10

## 2020-10-23 NOTE — Patient Instructions (Signed)
Ferumoxytol injection What is this medication? FERUMOXYTOL is an iron complex. Iron is used to make healthy red blood cells, which carry oxygen and nutrients throughout the body. This medicine is used totreat iron deficiency anemia. This medicine may be used for other purposes; ask your health care provider orpharmacist if you have questions. COMMON BRAND NAME(S): Feraheme What should I tell my care team before I take this medication? They need to know if you have any of these conditions: anemia not caused by low iron levels high levels of iron in the blood magnetic resonance imaging (MRI) test scheduled an unusual or allergic reaction to iron, other medicines, foods, dyes, or preservatives pregnant or trying to get pregnant breast-feeding How should I use this medication? This medicine is for injection into a vein. It is given by a health careprofessional in a hospital or clinic setting. Talk to your pediatrician regarding the use of this medicine in children.Special care may be needed. Overdosage: If you think you have taken too much of this medicine contact apoison control center or emergency room at once. NOTE: This medicine is only for you. Do not share this medicine with others. What if I miss a dose? It is important not to miss your dose. Call your doctor or health careprofessional if you are unable to keep an appointment. What may interact with this medication? This medicine may interact with the following medications: other iron products This list may not describe all possible interactions. Give your health care provider a list of all the medicines, herbs, non-prescription drugs, or dietary supplements you use. Also tell them if you smoke, drink alcohol, or use illegaldrugs. Some items may interact with your medicine. What should I watch for while using this medication? Visit your doctor or healthcare professional regularly. Tell your doctor or healthcare professional if your  symptoms do not start to get better or if theyget worse. You may need blood work done while you are taking this medicine. You may need to follow a special diet. Talk to your doctor. Foods that contain iron include: whole grains/cereals, dried fruits, beans, or peas, leafy greenvegetables, and organ meats (liver, kidney). What side effects may I notice from receiving this medication? Side effects that you should report to your doctor or health care professionalas soon as possible: allergic reactions like skin rash, itching or hives, swelling of the face, lips, or tongue breathing problems changes in blood pressure feeling faint or lightheaded, falls fever or chills flushing, sweating, or hot feelings swelling of the ankles or feet Side effects that usually do not require medical attention (report to yourdoctor or health care professional if they continue or are bothersome): diarrhea headache nausea, vomiting stomach pain This list may not describe all possible side effects. Call your doctor for medical advice about side effects. You may report side effects to FDA at1-800-FDA-1088. Where should I keep my medication? This drug is given in a hospital or clinic and will not be stored at home. NOTE: This sheet is a summary. It may not cover all possible information. If you have questions about this medicine, talk to your doctor, pharmacist, orhealth care provider.  2022 Elsevier/Gold Standard (2016-06-01 20:21:10)  

## 2020-10-23 NOTE — Progress Notes (Signed)
.   Refused to wait 30 min post infusion. Released stable and ASX.

## 2020-10-24 ENCOUNTER — Inpatient Hospital Stay: Payer: Managed Care, Other (non HMO)

## 2020-10-31 ENCOUNTER — Inpatient Hospital Stay: Payer: Managed Care, Other (non HMO) | Attending: Internal Medicine

## 2020-10-31 ENCOUNTER — Other Ambulatory Visit: Payer: Self-pay

## 2020-10-31 VITALS — BP 151/74 | HR 80 | Temp 98.9°F

## 2020-10-31 DIAGNOSIS — D500 Iron deficiency anemia secondary to blood loss (chronic): Secondary | ICD-10-CM | POA: Diagnosis present

## 2020-10-31 DIAGNOSIS — K922 Gastrointestinal hemorrhage, unspecified: Secondary | ICD-10-CM | POA: Insufficient documentation

## 2020-10-31 MED ORDER — SODIUM CHLORIDE 0.9 % IV SOLN
300.0000 mg | Freq: Once | INTRAVENOUS | Status: AC
  Administered 2020-10-31: 300 mg via INTRAVENOUS
  Filled 2020-10-31: qty 200

## 2020-10-31 MED ORDER — SODIUM CHLORIDE 0.9 % IV SOLN
Freq: Once | INTRAVENOUS | Status: AC
  Filled 2020-10-31: qty 250

## 2020-11-08 ENCOUNTER — Ambulatory Visit: Payer: Managed Care, Other (non HMO)

## 2020-11-11 ENCOUNTER — Other Ambulatory Visit: Payer: Self-pay

## 2020-11-11 ENCOUNTER — Inpatient Hospital Stay: Payer: Managed Care, Other (non HMO)

## 2020-11-11 VITALS — BP 145/69 | HR 83 | Temp 98.5°F | Resp 17

## 2020-11-11 DIAGNOSIS — D500 Iron deficiency anemia secondary to blood loss (chronic): Secondary | ICD-10-CM | POA: Diagnosis not present

## 2020-11-11 MED ORDER — SODIUM CHLORIDE 0.9 % IV SOLN
300.0000 mg | Freq: Once | INTRAVENOUS | Status: AC
  Administered 2020-11-11: 300 mg via INTRAVENOUS
  Filled 2020-11-11: qty 15

## 2020-11-11 MED ORDER — SODIUM CHLORIDE 0.9 % IV SOLN
Freq: Once | INTRAVENOUS | Status: AC
  Filled 2020-11-11: qty 250

## 2020-11-11 NOTE — Patient Instructions (Signed)
[facility]  Discharge Instructions: Thank you for choosing [facility] to provide your oncology and hematology care.   If you have a lab appointment with the Cancer Center, please go directly to the Cancer Center and check in at the registration area.  Wear comfortable clothing and clothing appropriate for easy access to any Portacath or PICC line.   We strive to give you quality time with your provider. You may need to reschedule your appointment if you arrive late (15 or more minutes).  Arriving late affects you and other patients whose appointments are after yours.  Also, if you miss three or more appointments without notifying the office, you may be dismissed from the clinic at the provider's discretion.      For prescription refill requests, have your pharmacy contact our office and allow 72 hours for refills to be completed.    Today you received the following chemotherapy and/or immunotherapy agents venofer       To help prevent nausea and vomiting after your treatment, we encourage you to take your nausea medication as directed.  BELOW ARE SYMPTOMS THAT SHOULD BE REPORTED IMMEDIATELY: *FEVER GREATER THAN 100.4 F (38 C) OR HIGHER *CHILLS OR SWEATING *NAUSEA AND VOMITING THAT IS NOT CONTROLLED WITH YOUR NAUSEA MEDICATION *UNUSUAL SHORTNESS OF BREATH *UNUSUAL BRUISING OR BLEEDING *URINARY PROBLEMS (pain or burning when urinating, or frequent urination) *BOWEL PROBLEMS (unusual diarrhea, constipation, pain near the anus) TENDERNESS IN MOUTH AND THROAT WITH OR WITHOUT PRESENCE OF ULCERS (sore throat, sores in mouth, or a toothache) UNUSUAL RASH, SWELLING OR PAIN  UNUSUAL VAGINAL DISCHARGE OR ITCHING   Items with * indicate a potential emergency and should be followed up as soon as possible or go to the Emergency Department if any problems should occur.  Please show the CHEMOTHERAPY ALERT CARD or IMMUNOTHERAPY ALERT CARD at check-in to the  Emergency Department and triage nurse. Should you have questions after your visit or need to cancel or reschedule your appointment, please contact [facility]  [phone] and follow the prompts.  Office hours are 8:00 a.m. to 4:30 p.m. Monday - Friday. Please note that voicemails left after 4:00 p.m. may not be returned until the following business day.  We are closed weekends and major holidays. You have access to a nurse at all times for urgent questions. Please call the main number to the clinic [phone] and follow the prompts.  For any non-urgent questions, you may also contact your provider using MyChart. We now offer e-Visits for anyone 18 and older to request care online for non-urgent symptoms. For details visit [email or web address].   Also download the MyChart app! Go to the app store, search "MyChart", open the app, select [facility], and log in with your MyChart username and password.  Due to Covid, a mask is required upon entering the hospital/clinic. If you do not have a mask, one will be given to you upon arrival. For doctor visits, patients may have 1 support person aged 18 or older with them. For treatment visits, patients cannot have anyone with them due to current Covid guidelines and our immunocompromised population.  

## 2020-11-12 ENCOUNTER — Inpatient Hospital Stay: Payer: Managed Care, Other (non HMO)

## 2020-11-12 ENCOUNTER — Telehealth: Payer: Self-pay | Admitting: Internal Medicine

## 2020-11-12 ENCOUNTER — Inpatient Hospital Stay: Payer: Managed Care, Other (non HMO) | Admitting: Internal Medicine

## 2020-11-12 NOTE — Telephone Encounter (Signed)
R/s appts per 7/18 sch msg. Called pt, no answer. Left msg with appts dates and times.

## 2020-12-10 ENCOUNTER — Inpatient Hospital Stay: Payer: Managed Care, Other (non HMO) | Admitting: Internal Medicine

## 2020-12-10 ENCOUNTER — Inpatient Hospital Stay: Payer: Managed Care, Other (non HMO)

## 2020-12-31 ENCOUNTER — Other Ambulatory Visit: Payer: Self-pay

## 2020-12-31 ENCOUNTER — Inpatient Hospital Stay (HOSPITAL_BASED_OUTPATIENT_CLINIC_OR_DEPARTMENT_OTHER): Payer: Managed Care, Other (non HMO) | Admitting: Internal Medicine

## 2020-12-31 ENCOUNTER — Inpatient Hospital Stay: Payer: Managed Care, Other (non HMO) | Attending: Internal Medicine

## 2020-12-31 VITALS — BP 163/81 | HR 99 | Temp 98.9°F | Resp 18 | Wt 178.2 lb

## 2020-12-31 DIAGNOSIS — F1010 Alcohol abuse, uncomplicated: Secondary | ICD-10-CM | POA: Insufficient documentation

## 2020-12-31 DIAGNOSIS — K922 Gastrointestinal hemorrhage, unspecified: Secondary | ICD-10-CM | POA: Insufficient documentation

## 2020-12-31 DIAGNOSIS — I10 Essential (primary) hypertension: Secondary | ICD-10-CM | POA: Insufficient documentation

## 2020-12-31 DIAGNOSIS — D500 Iron deficiency anemia secondary to blood loss (chronic): Secondary | ICD-10-CM | POA: Insufficient documentation

## 2020-12-31 LAB — CBC WITH DIFFERENTIAL (CANCER CENTER ONLY)
Abs Immature Granulocytes: 0.01 10*3/uL (ref 0.00–0.07)
Basophils Absolute: 0 10*3/uL (ref 0.0–0.1)
Basophils Relative: 1 %
Eosinophils Absolute: 0.1 10*3/uL (ref 0.0–0.5)
Eosinophils Relative: 3 %
HCT: 36 % — ABNORMAL LOW (ref 39.0–52.0)
Hemoglobin: 11.7 g/dL — ABNORMAL LOW (ref 13.0–17.0)
Immature Granulocytes: 0 %
Lymphocytes Relative: 21 %
Lymphs Abs: 0.7 10*3/uL (ref 0.7–4.0)
MCH: 31.1 pg (ref 26.0–34.0)
MCHC: 32.5 g/dL (ref 30.0–36.0)
MCV: 95.7 fL (ref 80.0–100.0)
Monocytes Absolute: 0.5 10*3/uL (ref 0.1–1.0)
Monocytes Relative: 16 %
Neutro Abs: 1.9 10*3/uL (ref 1.7–7.7)
Neutrophils Relative %: 59 %
Platelet Count: 65 10*3/uL — ABNORMAL LOW (ref 150–400)
RBC: 3.76 MIL/uL — ABNORMAL LOW (ref 4.22–5.81)
RDW: 14 % (ref 11.5–15.5)
WBC Count: 3.3 10*3/uL — ABNORMAL LOW (ref 4.0–10.5)
nRBC: 0 % (ref 0.0–0.2)

## 2020-12-31 NOTE — Progress Notes (Signed)
[place] [phone]   Fax:([phone]  OFFICE PROGRESS NOTE  Patient, No Pcp Per (Inactive) No address on file  DIAGNOSIS: Microcytic anemia secondary to iron deficiency secondary to gastrointestinal blood loss.  The patient is intolerable to oral iron tablet. Bone marrow biopsy and aspirate showed no concerning underlying etiology.  PRIOR THERAPY: Iron infusion with Venofer 300 Mg IV weekly for 3 weeks  CURRENT THERAPY: None  INTERVAL HISTORY: Leroy Bell 60 y.o. male returns to the clinic today for follow-up visit.  The patient is feeling fine today with no concerning complaints.  He feels much better compared to several weeks ago after the iron infusion.  He denied having any chest pain, shortness of breath, cough or hemoptysis.  He denied having any fever or chills.  He has no nausea, vomiting, diarrhea or constipation.  He has no headache or visual changes.  He has no weight loss or night sweats.  Unfortunately he continues to drink alcohol at regular basis.  The patient is here today for evaluation and repeat blood work.   MEDICAL HISTORY: Past Medical History:  Diagnosis Date   Alcohol abuse    Diabetes ([place])    Dyspnea    from anemia   Hypertension     ALLERGIES:  is allergic to penicillins.  MEDICATIONS:  Current Outpatient Medications  Medication Sig Dispense Refill   Blood Glucose Monitoring Suppl ([place]) w/Device KIT Use to check blood sugars every morning fasting and 2 hours after largest meal 1 kit 0   glucose blood test strip Use to check blood sugars every morning fasting and 2 hours after largest meal 100 each 12   OneTouch Delica Lancets 65H MISC Use to check blood sugars every morning fasting and 2 hours after largest meal 100 each 3   No current facility-administered medications for this visit.    SURGICAL HISTORY:  Past Surgical History:  Procedure Laterality Date   NO PAST SURGERIES       REVIEW OF SYSTEMS:  A comprehensive review of systems was negative.   PHYSICAL EXAMINATION: General appearance: alert, appears stated age, and no distress Head: Normocephalic, without obvious abnormality, atraumatic Neck: no adenopathy, no JVD, supple, symmetrical, trachea midline, and thyroid not enlarged, symmetric, no tenderness/mass/nodules Lymph nodes: Cervical, supraclavicular, and axillary nodes normal. Resp: clear to auscultation bilaterally Back: symmetric, no curvature. ROM normal. No CVA tenderness. Cardio: regular rate and rhythm, S1, S2 normal, no murmur, click, rub or gallop GI: soft, non-tender; bowel sounds normal; no masses,  no organomegaly Extremities: extremities normal, atraumatic, no cyanosis or edema  ECOG PERFORMANCE STATUS: 1 - Symptomatic but completely ambulatory  Blood pressure (!) 163/81, pulse 99, temperature 98.9 F (37.2 C), temperature source Oral, resp. rate 18, weight 178 lb 4 oz (80.9 kg), SpO2 98 %.  LABORATORY DATA: Lab Results  Component Value Date   WBC 3.3 (L) 12/31/2020   HGB 11.7 (L) 12/31/2020   HCT 36.0 (L) 12/31/2020   MCV 95.7 12/31/2020   PLT 65 (L) 12/31/2020      Chemistry      Component Value Date/Time   NA 136 09/09/2020 0837   NA 136 10/05/2018 0923   K 3.9 09/09/2020 0837   CL 106 09/09/2020 0837   CO2 23 09/09/2020 0837   BUN 5 (L) 09/09/2020 0837   BUN 6 10/05/2018 0923   CREATININE 0.77 09/09/2020 0837      Component Value Date/Time  CALCIUM 8.6 (L) 09/09/2020 0837   ALKPHOS 241 (H) 09/09/2020 0837   AST 84 (H) 09/09/2020 0837   ALT 32 09/09/2020 0837   BILITOT 1.4 (H) 09/09/2020 6435       RADIOGRAPHIC STUDIES: No results found.   ASSESSMENT AND PLAN: This is a very pleasant 60 years old white male with pancytopenia who was initially evaluated for iron deficiency anemia and received iron infusion with Venofer 300 Mg IV weekly for 3 weeks with no significant improvement in his hemoglobin and  hematocrit. The patient had a bone marrow biopsy and aspirate performed recently that showed slightly hypercellular bone marrow for age with trilineage hematopoiesis.  The biopsy showed no feature specific or diagnostic of a primary myeloid neoplasm and may be secondary in nature as a result of nutritional deficiency, medication, alcohol, infection or immune mediated process.  The patient has a very well-known history of alcohol abuse.  The patient received 2 units of PRBCs transfusion after his last visit.  He also was treated with iron infusion with Venofer weekly for 3 weeks. He is feeling much better now with less fatigue. Repeat CBC showed significant improvement in his hemoglobin and hematocrit.  He continues to have mild anemia. I recommended for him to continue on observation with repeat CBC, iron study and ferritin in 3 months. For the hypertension he will see his primary care physician for evaluation and treatment of his condition. For the alcohol abuse, I strongly encouraged the patient to quit drinking but unfortunately he continues to drink at regular basis. He was advised to call immediately if he has any other concerning symptoms in the interval.  The patient voices understanding of current disease status and treatment options and is in agreement with the current care plan.  All questions were answered. The patient knows to call the clinic with any problems, questions or concerns. We can certainly see the patient much sooner if necessary.  Disclaimer: This note was dictated with voice recognition software. Similar sounding words can inadvertently be transcribed and may not be corrected upon review.

## 2021-01-01 ENCOUNTER — Telehealth: Payer: Self-pay | Admitting: Internal Medicine

## 2021-01-01 NOTE — Telephone Encounter (Signed)
Scheduled appt per 9/6 los  - mailed letter with appt date and time   

## 2021-01-17 ENCOUNTER — Other Ambulatory Visit: Payer: Self-pay | Admitting: Medical Oncology

## 2021-01-17 ENCOUNTER — Other Ambulatory Visit: Payer: Self-pay | Admitting: Physician Assistant

## 2021-01-17 ENCOUNTER — Telehealth: Payer: Self-pay | Admitting: Medical Oncology

## 2021-01-17 DIAGNOSIS — D500 Iron deficiency anemia secondary to blood loss (chronic): Secondary | ICD-10-CM

## 2021-01-17 NOTE — Telephone Encounter (Signed)
He feels fatigued and concerned he might need blood.  Cassie ordered labs for Monday -pt notified. His dtr is getting married next weekend.

## 2021-01-20 ENCOUNTER — Telehealth: Payer: Self-pay | Admitting: Medical Oncology

## 2021-01-20 ENCOUNTER — Other Ambulatory Visit: Payer: Self-pay

## 2021-01-20 ENCOUNTER — Other Ambulatory Visit: Payer: Self-pay | Admitting: Physician Assistant

## 2021-01-20 ENCOUNTER — Inpatient Hospital Stay: Payer: Managed Care, Other (non HMO)

## 2021-01-20 ENCOUNTER — Other Ambulatory Visit: Payer: Self-pay | Admitting: Medical Oncology

## 2021-01-20 DIAGNOSIS — D500 Iron deficiency anemia secondary to blood loss (chronic): Secondary | ICD-10-CM

## 2021-01-20 LAB — CBC WITH DIFFERENTIAL (CANCER CENTER ONLY)
Abs Immature Granulocytes: 0.02 10*3/uL (ref 0.00–0.07)
Basophils Absolute: 0 10*3/uL (ref 0.0–0.1)
Basophils Relative: 1 %
Eosinophils Absolute: 0.1 10*3/uL (ref 0.0–0.5)
Eosinophils Relative: 2 %
HCT: 25.8 % — ABNORMAL LOW (ref 39.0–52.0)
Hemoglobin: 8.1 g/dL — ABNORMAL LOW (ref 13.0–17.0)
Immature Granulocytes: 1 %
Lymphocytes Relative: 12 %
Lymphs Abs: 0.5 10*3/uL — ABNORMAL LOW (ref 0.7–4.0)
MCH: 29 pg (ref 26.0–34.0)
MCHC: 31.4 g/dL (ref 30.0–36.0)
MCV: 92.5 fL (ref 80.0–100.0)
Monocytes Absolute: 0.5 10*3/uL (ref 0.1–1.0)
Monocytes Relative: 13 %
Neutro Abs: 3 10*3/uL (ref 1.7–7.7)
Neutrophils Relative %: 71 %
Platelet Count: 131 10*3/uL — ABNORMAL LOW (ref 150–400)
RBC: 2.79 MIL/uL — ABNORMAL LOW (ref 4.22–5.81)
RDW: 14.6 % (ref 11.5–15.5)
WBC Count: 4.1 10*3/uL (ref 4.0–10.5)
nRBC: 0 % (ref 0.0–0.2)

## 2021-01-20 LAB — PREPARE RBC (CROSSMATCH)

## 2021-01-20 LAB — SAMPLE TO BLOOD BANK

## 2021-01-20 NOTE — Telephone Encounter (Addendum)
Schedule request sent to get two unit of blood in the next 2 days.

## 2021-01-21 ENCOUNTER — Inpatient Hospital Stay: Payer: Managed Care, Other (non HMO)

## 2021-01-21 ENCOUNTER — Other Ambulatory Visit: Payer: Self-pay

## 2021-01-21 DIAGNOSIS — D500 Iron deficiency anemia secondary to blood loss (chronic): Secondary | ICD-10-CM

## 2021-01-21 MED ORDER — SODIUM CHLORIDE 0.9% IV SOLUTION
250.0000 mL | Freq: Once | INTRAVENOUS | Status: AC
  Administered 2021-01-21: 250 mL via INTRAVENOUS

## 2021-01-21 MED ORDER — ACETAMINOPHEN 325 MG PO TABS
650.0000 mg | ORAL_TABLET | Freq: Once | ORAL | Status: AC
  Administered 2021-01-21: 650 mg via ORAL
  Filled 2021-01-21: qty 2

## 2021-01-21 MED ORDER — DIPHENHYDRAMINE HCL 25 MG PO CAPS
25.0000 mg | ORAL_CAPSULE | Freq: Once | ORAL | Status: AC
  Administered 2021-01-21: 25 mg via ORAL
  Filled 2021-01-21: qty 1

## 2021-01-21 NOTE — Progress Notes (Signed)
To run blood at 33ml/hr per Dr. Arbutus Ped

## 2021-01-22 LAB — BPAM RBC
Blood Product Expiration Date: 202210262359
Blood Product Expiration Date: 202210282359
ISSUE DATE / TIME: 202209271218
ISSUE DATE / TIME: 202209271407
Unit Type and Rh: 5100
Unit Type and Rh: 5100

## 2021-01-22 LAB — TYPE AND SCREEN
ABO/RH(D): O POS
Antibody Screen: NEGATIVE
Unit division: 0
Unit division: 0

## 2021-01-23 ENCOUNTER — Telehealth: Payer: Self-pay | Admitting: *Deleted

## 2021-01-29 ENCOUNTER — Encounter: Payer: Self-pay | Admitting: Internal Medicine

## 2021-01-29 ENCOUNTER — Other Ambulatory Visit: Payer: Self-pay

## 2021-01-29 ENCOUNTER — Ambulatory Visit (INDEPENDENT_AMBULATORY_CARE_PROVIDER_SITE_OTHER): Payer: Managed Care, Other (non HMO) | Admitting: Internal Medicine

## 2021-01-29 VITALS — BP 180/76 | HR 92 | Temp 98.8°F | Resp 16 | Ht 67.0 in | Wt 183.0 lb

## 2021-01-29 DIAGNOSIS — F1010 Alcohol abuse, uncomplicated: Secondary | ICD-10-CM

## 2021-01-29 DIAGNOSIS — Z125 Encounter for screening for malignant neoplasm of prostate: Secondary | ICD-10-CM | POA: Diagnosis not present

## 2021-01-29 DIAGNOSIS — E119 Type 2 diabetes mellitus without complications: Secondary | ICD-10-CM

## 2021-01-29 DIAGNOSIS — Z0001 Encounter for general adult medical examination with abnormal findings: Secondary | ICD-10-CM

## 2021-01-29 DIAGNOSIS — I10 Essential (primary) hypertension: Secondary | ICD-10-CM

## 2021-01-29 DIAGNOSIS — R7989 Other specified abnormal findings of blood chemistry: Secondary | ICD-10-CM

## 2021-01-29 DIAGNOSIS — D61818 Other pancytopenia: Secondary | ICD-10-CM

## 2021-01-29 DIAGNOSIS — G4733 Obstructive sleep apnea (adult) (pediatric): Secondary | ICD-10-CM

## 2021-01-29 DIAGNOSIS — D500 Iron deficiency anemia secondary to blood loss (chronic): Secondary | ICD-10-CM | POA: Diagnosis not present

## 2021-01-29 DIAGNOSIS — Z9989 Dependence on other enabling machines and devices: Secondary | ICD-10-CM

## 2021-01-29 DIAGNOSIS — K7030 Alcoholic cirrhosis of liver without ascites: Secondary | ICD-10-CM

## 2021-01-29 DIAGNOSIS — E785 Hyperlipidemia, unspecified: Secondary | ICD-10-CM

## 2021-01-29 DIAGNOSIS — Z23 Encounter for immunization: Secondary | ICD-10-CM

## 2021-01-29 DIAGNOSIS — D509 Iron deficiency anemia, unspecified: Secondary | ICD-10-CM

## 2021-01-29 DIAGNOSIS — R9431 Abnormal electrocardiogram [ECG] [EKG]: Secondary | ICD-10-CM | POA: Diagnosis not present

## 2021-01-29 LAB — HEPATIC FUNCTION PANEL
ALT: 40 U/L (ref 0–53)
AST: 110 U/L — ABNORMAL HIGH (ref 0–37)
Albumin: 3.6 g/dL (ref 3.5–5.2)
Alkaline Phosphatase: 187 U/L — ABNORMAL HIGH (ref 39–117)
Bilirubin, Direct: 0.7 mg/dL — ABNORMAL HIGH (ref 0.0–0.3)
Total Bilirubin: 2 mg/dL — ABNORMAL HIGH (ref 0.2–1.2)
Total Protein: 6.6 g/dL (ref 6.0–8.3)

## 2021-01-29 LAB — MICROALBUMIN / CREATININE URINE RATIO
Creatinine,U: 61.5 mg/dL
Microalb Creat Ratio: 1.1 mg/g (ref 0.0–30.0)
Microalb, Ur: <0.7 mg/dL (ref 0.0–1.9)

## 2021-01-29 LAB — VITAMIN D 25 HYDROXY (VIT D DEFICIENCY, FRACTURES): VITD: 30.61 ng/mL (ref 30.00–100.00)

## 2021-01-29 LAB — LIPID PANEL
Cholesterol: 140 mg/dL (ref 0–200)
HDL: 49.4 mg/dL (ref >39.00)
LDL Cholesterol: 75 mg/dL (ref 0–99)
NonHDL: 90.83
Total CHOL/HDL Ratio: 3
Triglycerides: 77 mg/dL (ref 0.0–149.0)
VLDL: 15.4 mg/dL (ref 0.0–40.0)

## 2021-01-29 LAB — IBC + FERRITIN
Ferritin: 19.6 ng/mL — ABNORMAL LOW (ref 22.0–322.0)
Iron: 71 ug/dL (ref 42–165)
Saturation Ratios: 22.9 % (ref 20.0–50.0)
TIBC: 309.4 ug/dL (ref 250.0–450.0)
Transferrin: 221 mg/dL (ref 212.0–360.0)

## 2021-01-29 LAB — TSH: TSH: 3.28 u[IU]/mL (ref 0.35–5.50)

## 2021-01-29 LAB — BASIC METABOLIC PANEL
BUN: 5 mg/dL — ABNORMAL LOW (ref 6–23)
CO2: 25 mEq/L (ref 19–32)
Calcium: 9.1 mg/dL (ref 8.4–10.5)
Chloride: 105 mEq/L (ref 96–112)
Creatinine, Ser: 0.63 mg/dL (ref 0.40–1.50)
GFR: 105 mL/min (ref >60.00)
Glucose, Bld: 151 mg/dL — ABNORMAL HIGH (ref 70–99)
Potassium: 3.9 mEq/L (ref 3.5–5.1)
Sodium: 138 mEq/L (ref 135–145)

## 2021-01-29 LAB — HEMOGLOBIN A1C: Hgb A1c MFr Bld: 5.8 % (ref 4.6–6.5)

## 2021-01-29 LAB — PSA: PSA: 0.29 ng/mL (ref 0.10–4.00)

## 2021-01-29 LAB — BRAIN NATRIURETIC PEPTIDE: Pro B Natriuretic peptide (BNP): 46 pg/mL (ref 0.0–100.0)

## 2021-01-29 LAB — PROTIME-INR
INR: 1.4 ratio — ABNORMAL HIGH (ref 0.8–1.0)
Prothrombin Time: 15 s — ABNORMAL HIGH (ref 9.6–13.1)

## 2021-01-29 LAB — TROPONIN I (HIGH SENSITIVITY): High Sens Troponin I: 3 ng/L (ref 2–17)

## 2021-01-29 MED ORDER — ACCRUFER 30 MG PO CAPS: 1.0000 | ORAL_CAPSULE | Freq: Two times a day (BID) | ORAL | 1 refills | Status: DC

## 2021-01-29 NOTE — Progress Notes (Signed)
Subjective:  Patient ID: Leroy Bell, male    DOB: 1960-05-08  Age: 60 y.o. MRN: [id number]  CC: Annual Exam, Anemia, Diabetes, Hyperlipidemia, and Hypertension  This visit occurred during the SARS-CoV-2 public health emergency.  Safety protocols were in place, including screening questions prior to the visit, additional usage of staff PPE, and extensive cleaning of exam room while observing appropriate contact time as indicated for disinfecting solutions.    HPI DUY LEMMING presents for a CPX and to establish.  He has a history of alcohol-related problems.  He continues to drink alcohol.  He has rare episodes of dyspnea on exertion and headaches.  He denies chest pain, diaphoresis, palpitations, dizziness, lightheadedness, or edema.  History Kentravious has a past medical history of Alcohol abuse, Diabetes ([place]), Dyspnea, and Hypertension.   He has a past surgical history that includes No past surgeries.   His family history includes Cancer in his paternal grandfather; Diabetes in his father, mother, paternal grandfather, paternal grandmother, and sister; Healthy in his daughter and son; Heart attack in his father; Hypertension in his father and mother; Prostate cancer in his father.He reports that he has never smoked. His smokeless tobacco use includes snuff. He reports current alcohol use of about 24.0 standard drinks per week. He reports that he does not currently use drugs.  Outpatient Medications Prior to Visit  Medication Sig Dispense Refill   Blood Glucose Monitoring Suppl ([place]) w/Device KIT Use to check blood sugars every morning fasting and 2 hours after largest meal (Patient not taking: Reported on 01/29/2021) 1 kit 0   glucose blood test strip Use to check blood sugars every morning fasting and 2 hours after largest meal (Patient not taking: Reported on 01/29/2021) 100 each 12   OneTouch Delica Lancets 90W MISC Use to check blood sugars every morning  fasting and 2 hours after largest meal (Patient not taking: Reported on 01/29/2021) 100 each 3   No facility-administered medications prior to visit.    ROS Review of Systems  Constitutional:  Negative for chills, diaphoresis, fatigue and fever.  HENT: Negative.    Eyes: Negative.   Respiratory:  Positive for apnea and shortness of breath. Negative for cough, chest tightness and wheezing.   Cardiovascular:  Negative for chest pain, palpitations and leg swelling.  Gastrointestinal:  Negative for abdominal pain, blood in stool, constipation, diarrhea, nausea and vomiting.  Endocrine: Negative.   Genitourinary: Negative.  Negative for difficulty urinating, hematuria, penile swelling, scrotal swelling and testicular pain.  Musculoskeletal: Negative.  Negative for myalgias.  Skin: Negative.   Neurological:  Positive for headaches. Negative for dizziness, weakness, light-headedness and numbness.  Hematological:  Negative for adenopathy. Does not bruise/bleed easily.  Psychiatric/Behavioral: Negative.     Objective:  BP (!) 180/76 (BP Location: Right Arm, Patient Position: Sitting, Cuff Size: Large)   Pulse 92   Temp 98.8 F (37.1 C) (Oral)   Resp 16   Ht 5' 7" (1.702 m)   Wt 183 lb (83 kg)   SpO2 97%   BMI 28.66 kg/m   Physical Exam Vitals reviewed. Exam conducted with a chaperone present.  Constitutional:      Appearance: Normal appearance. He is ill-appearing.  HENT:     Nose: Nose normal.     Mouth/Throat:     Mouth: Mucous membranes are moist.  Eyes:     General: No scleral icterus.    Conjunctiva/sclera: Conjunctivae normal.  Cardiovascular:     Rate and  Rhythm: Regular rhythm. Tachycardia present.     Heart sounds: No murmur heard.    Comments: EKG- NSR, 94 bpm LAFB ??inf/ant/lateral Q waves  Changes noted compared to the prior EKG in 2020 No LVH Pulmonary:     Effort: Pulmonary effort is normal.     Breath sounds: No stridor. No wheezing, rhonchi or rales.   Abdominal:     General: Abdomen is protuberant. Bowel sounds are normal. There is no distension.     Palpations: Abdomen is soft. There is no hepatomegaly, splenomegaly or mass.     Tenderness: There is no abdominal tenderness. There is no guarding.     Hernia: No hernia is present. There is no hernia in the left inguinal area or right inguinal area.  Genitourinary:    Pubic Area: No rash.      Penis: Normal and circumcised.      Testes: Normal.     Epididymis:     Right: Normal. Not inflamed or enlarged. No mass.     Left: Normal. Not inflamed or enlarged. No mass.     Prostate: Normal. Not enlarged, not tender and no nodules present.     Rectum: Normal. Guaiac result negative. No mass, tenderness, anal fissure, external hemorrhoid or internal hemorrhoid. Normal anal tone.  Musculoskeletal:     Cervical back: Neck supple.     Right lower leg: Edema (trace pitting) present.     Left lower leg: Edema (trace pitting) present.  Lymphadenopathy:     Cervical: No cervical adenopathy.     Lower Body: No right inguinal adenopathy. No left inguinal adenopathy.  Skin:    General: Skin is warm and dry.     Coloration: Skin is not jaundiced.     Findings: No rash.  Neurological:     General: No focal deficit present.     Mental Status: He is alert. Mental status is at baseline.  Psychiatric:        Attention and Perception: Perception normal. He is inattentive.        Mood and Affect: Mood is anxious.        Speech: Speech normal.        Behavior: Behavior normal. Behavior is cooperative.        Thought Content: Thought content normal. Thought content is not paranoid or delusional. Thought content does not include homicidal or suicidal ideation.     Comments: ++ tremulous    Lab Results  Component Value Date   WBC 4.1 01/20/2021   HGB 8.1 (L) 01/20/2021   HCT 25.8 (L) 01/20/2021   PLT 131 (L) 01/20/2021   GLUCOSE 151 (H) 01/29/2021   CHOL 140 01/29/2021   TRIG 77.0 01/29/2021    HDL 49.40 01/29/2021   LDLCALC 75 01/29/2021   ALT 40 01/29/2021   AST 110 (H) 01/29/2021   NA 138 01/29/2021   K 3.9 01/29/2021   CL 105 01/29/2021   CREATININE 0.63 01/29/2021   BUN 5 (L) 01/29/2021   CO2 25 01/29/2021   TSH 3.28 01/29/2021   PSA 0.29 01/29/2021   INR 1.4 (H) 01/29/2021   HGBA1C 5.8 01/29/2021   MICROALBUR <0.7 01/29/2021     IMPRESSION: 1. Mildly motion degraded exam. 2. Cirrhosis and portal venous hypertension, without hepatocellular carcinoma. 3. Given motion degradation, no gallbladder mass or acute process. 4. At least 1 cystic lesion within the pancreatic head neck. Most likely a pseudocyst versus indolent cystic neoplasm. This could be re-evaluated on routine follow-up imaging  for hepatocellular carcinoma surveillance. If this is not performed, follow-up with pre and post contrast abdominal MRI/MRCP at 1 year is recommended. This recommendation follows ACR consensus guidelines: Management of Incidental Pancreatic Cysts: A White Paper of the ACR Incidental Findings Committee. [place] 2637;[id number]. 5. Splenomegaly. 6.  Aortic Atherosclerosis (ICD10-I70.0). 7. Small-bowel fold thickening could simply be related to underdistention or portal venous hypertension. Recommend clinical correlation for enteritis.     Electronically Signed   By: Abigail Miyamoto M.D.   On: 06/30/2020 20:40    Assessment & Plan:   Javarus was seen today for annual exam, anemia, diabetes, hyperlipidemia and hypertension.  Diagnoses and all orders for this visit:  Iron deficiency anemia, unspecified iron deficiency anemia type- His iron is normal now.  Iron replacement therapy is not indicated. -     Discontinue: Ferric Maltol (ACCRUFER) 30 MG CAPS; Take 1 capsule by mouth in the morning and at bedtime.  New onset type 2 diabetes mellitus ([place])- His blood sugar is adequately well controlled. -     Basic metabolic panel; Future -     Microalbumin / creatinine  urine ratio; Future -     Urinalysis, Routine w reflex microscopic; Future -     Hemoglobin A1c; Future -     C-peptide; Future -     HM Diabetes Foot Exam -     C-peptide -     Hemoglobin A1c -     Urinalysis, Routine w reflex microscopic -     Microalbumin / creatinine urine ratio -     Basic metabolic panel  Essential hypertension- I will check labs to screen for secondary causes and endorgan damage.  Will treat this with nebivolol and olmesartan. -     Basic metabolic panel; Future -     TSH; Future -     Urinalysis, Routine w reflex microscopic; Future -     Aldosterone + renin activity w/ ratio; Future -     VITAMIN D 25 Hydroxy (Vit-D Deficiency, Fractures); Future -     EKG 12-Lead -     VITAMIN D 25 Hydroxy (Vit-D Deficiency, Fractures) -     Aldosterone + renin activity w/ ratio -     Urinalysis, Routine w reflex microscopic -     TSH -     Basic metabolic panel -     nebivolol (BYSTOLIC) 10 MG tablet; Take 1 tablet (10 mg total) by mouth daily. -     olmesartan (BENICAR) 20 MG tablet; Take 1 tablet (20 mg total) by mouth daily.  Iron deficiency anemia due to chronic blood loss -     IBC + Ferritin; Future -     Discontinue: Ferric Maltol (ACCRUFER) 30 MG CAPS; Take 1 capsule by mouth in the morning and at bedtime. -     IBC + Ferritin  Elevated LFTs- see below. -     Hepatic function panel; Future -     Protime-INR; Future -     Protime-INR -     Hepatic function panel  Encounter for general adult medical examination with abnormal findings- Exam completed, labs reviewed, vaccines reviewed-he agreed to a pneumonia vaccine and a Tdap but he refused a flu vaccine, cancer screenings are up-to-date, patient education was given. -     Lipid panel; Future -     PSA; Future -     PSA -     Lipid panel  OSA on CPAP -  Ambulatory referral to Sleep Studies  Other pancytopenia ([place]) -     Vitamin B1; Future -     Vitamin B1  Alcohol abuse- He has multiple  complications related to alcohol usage.  I have asked him to abstain from alcohol intake. -     Vitamin B1; Future -     Vitamin B1  Need for vaccination -     Pneumococcal conjugate vaccine 20-valent (Prevnar 20)  Abnormal electrocardiogram (ECG) (EKG) -     Ambulatory referral to Cardiology -     Troponin I (High Sensitivity); Future -     Brain natriuretic peptide; Future -     Brain natriuretic peptide -     Troponin I (High Sensitivity)  Alcoholic cirrhosis of liver without ascites ([name])  Hyperlipidemia LDL goal <70- I recommended that he take a statin for cardiovascular risk reduction. -     atorvastatin (LIPITOR) 10 MG tablet; Take 1 tablet (10 mg total) by mouth daily.  Other orders -     Tdap vaccine greater than or equal to 7yo IM  I have discontinued Gwyndolyn Saxon T. Gamboa's OneTouch Verio Flex System, glucose blood, OneTouch Delica Lancets 66M, and ACCRUFeR. I am also having him start on atorvastatin, nebivolol, and olmesartan.  Meds ordered this encounter  Medications   DISCONTD: Ferric Maltol (ACCRUFER) 30 MG CAPS    Sig: Take 1 capsule by mouth in the morning and at bedtime.    Dispense:  180 capsule    Refill:  1   atorvastatin (LIPITOR) 10 MG tablet    Sig: Take 1 tablet (10 mg total) by mouth daily.    Dispense:  90 tablet    Refill:  1   nebivolol (BYSTOLIC) 10 MG tablet    Sig: Take 1 tablet (10 mg total) by mouth daily.    Dispense:  90 tablet    Refill:  0   olmesartan (BENICAR) 20 MG tablet    Sig: Take 1 tablet (20 mg total) by mouth daily.    Dispense:  90 tablet    Refill:  0      Follow-up: Return in about 4 weeks (around 02/26/2021).  Scarlette Calico, MD

## 2021-01-29 NOTE — Patient Instructions (Signed)
Health Maintenance, Male Adopting a healthy lifestyle and getting preventive care are important in promoting health and wellness. Ask your health care provider about: The right schedule for you to have regular tests and exams. Things you can do on your own to prevent diseases and keep yourself healthy. What should I know about diet, weight, and exercise? Eat a healthy diet  Eat a diet that includes plenty of vegetables, fruits, low-fat dairy products, and lean protein. Do not eat a lot of foods that are high in solid fats, added sugars, or sodium. Maintain a healthy weight Body mass index (BMI) is a measurement that can be used to identify possible weight problems. It estimates body fat based on height and weight. Your health care provider can help determine your BMI and help you achieve or maintain a healthy weight. Get regular exercise Get regular exercise. This is one of the most important things you can do for your health. Most adults should: Exercise for at least 150 minutes each week. The exercise should increase your heart rate and make you sweat (moderate-intensity exercise). Do strengthening exercises at least twice a week. This is in addition to the moderate-intensity exercise. Spend less time sitting. Even light physical activity can be beneficial. Watch cholesterol and blood lipids Have your blood tested for lipids and cholesterol at 58 years of age, then have this test every 5 years. You may need to have your cholesterol levels checked more often if: Your lipid or cholesterol levels are high. You are older than 58 years of age. You are at high risk for heart disease. What should I know about cancer screening? Many types of cancers can be detected early and may often be prevented. Depending on your health history and family history, you may need to have cancer screening at various ages. This may include screening for: Colorectal cancer. Prostate cancer. Skin cancer. Lung  cancer. What should I know about heart disease, diabetes, and high blood pressure? Blood pressure and heart disease High blood pressure causes heart disease and increases the risk of stroke. This is more likely to develop in people who have high blood pressure readings, are of African descent, or are overweight. Talk with your health care provider about your target blood pressure readings. Have your blood pressure checked: Every 3-5 years if you are 18-39 years of age. Every year if you are 40 years old or older. If you are between the ages of 65 and 75 and are a current or former smoker, ask your health care provider if you should have a one-time screening for abdominal aortic aneurysm (AAA). Diabetes Have regular diabetes screenings. This checks your fasting blood sugar level. Have the screening done: Once every three years after age 45 if you are at a normal weight and have a low risk for diabetes. More often and at a younger age if you are overweight or have a high risk for diabetes. What should I know about preventing infection? Hepatitis B If you have a higher risk for hepatitis B, you should be screened for this virus. Talk with your health care provider to find out if you are at risk for hepatitis B infection. Hepatitis C Blood testing is recommended for: Everyone born from 1945 through 1965. Anyone with known risk factors for hepatitis C. Sexually transmitted infections (STIs) You should be screened each year for STIs, including gonorrhea and chlamydia, if: You are sexually active and are younger than 58 years of age. You are older than 58 years   of age and your health care provider tells you that you are at risk for this type of infection. Your sexual activity has changed since you were last screened, and you are at increased risk for chlamydia or gonorrhea. Ask your health care provider if you are at risk. Ask your health care provider about whether you are at high risk for HIV.  Your health care provider may recommend a prescription medicine to help prevent HIV infection. If you choose to take medicine to prevent HIV, you should first get tested for HIV. You should then be tested every 3 months for as long as you are taking the medicine. Follow these instructions at home: Lifestyle Do not use any products that contain nicotine or tobacco, such as cigarettes, e-cigarettes, and chewing tobacco. If you need help quitting, ask your health care provider. Do not use street drugs. Do not share needles. Ask your health care provider for help if you need support or information about quitting drugs. Alcohol use Do not drink alcohol if your health care provider tells you not to drink. If you drink alcohol: Limit how much you have to 0-2 drinks a day. Be aware of how much alcohol is in your drink. In the [place] one drink equals one 12 oz bottle of beer (355 mL), one 5 oz glass of wine (148 mL), or one 1 oz glass of hard liquor (44 mL). General instructions Schedule regular health, dental, and eye exams. Stay current with your vaccines. Tell your health care provider if: You often feel depressed. You have ever been abused or do not feel safe at home. Summary Adopting a healthy lifestyle and getting preventive care are important in promoting health and wellness. Follow your health care provider's instructions about healthy diet, exercising, and getting tested or screened for diseases. Follow your health care provider's instructions on monitoring your cholesterol and blood pressure. This information is not intended to replace advice given to you by your health care provider. Make sure you discuss any questions you have with your health care provider. Document Revised: 06/21/2020 Document Reviewed: 04/06/2018 Else[employer] Patient Education  2022 [employer]  

## 2021-01-30 DIAGNOSIS — K7460 Unspecified cirrhosis of liver: Secondary | ICD-10-CM | POA: Insufficient documentation

## 2021-01-30 DIAGNOSIS — E785 Hyperlipidemia, unspecified: Secondary | ICD-10-CM | POA: Insufficient documentation

## 2021-01-30 DIAGNOSIS — K7030 Alcoholic cirrhosis of liver without ascites: Secondary | ICD-10-CM | POA: Insufficient documentation

## 2021-01-30 DIAGNOSIS — K7031 Alcoholic cirrhosis of liver with ascites: Secondary | ICD-10-CM | POA: Insufficient documentation

## 2021-01-30 LAB — URINALYSIS, ROUTINE W REFLEX MICROSCOPIC
Bilirubin Urine: NEGATIVE
Hgb urine dipstick: NEGATIVE
Ketones, ur: NEGATIVE
Leukocytes,Ua: NEGATIVE
Nitrite: NEGATIVE
RBC / HPF: NONE SEEN (ref 0–?)
Specific Gravity, Urine: 1.015 (ref 1.000–1.030)
Total Protein, Urine: NEGATIVE
Urine Glucose: NEGATIVE
Urobilinogen, UA: 2 — AB (ref 0.0–1.0)
WBC, UA: NONE SEEN (ref 0–?)
pH: 7 (ref 5.0–8.0)

## 2021-01-30 MED ORDER — NEBIVOLOL HCL 10 MG PO TABS: 10.0000 mg | ORAL_TABLET | Freq: Every day | ORAL | 0 refills | Status: DC

## 2021-01-30 MED ORDER — OLMESARTAN MEDOXOMIL 20 MG PO TABS: 20.0000 mg | ORAL_TABLET | Freq: Every day | ORAL | 0 refills | Status: DC

## 2021-01-30 MED ORDER — ATORVASTATIN CALCIUM 10 MG PO TABS: 10.0000 mg | ORAL_TABLET | Freq: Every day | ORAL | 1 refills | Status: DC

## 2021-02-03 ENCOUNTER — Other Ambulatory Visit: Payer: Self-pay | Admitting: Internal Medicine

## 2021-02-03 ENCOUNTER — Encounter: Payer: Self-pay | Admitting: Internal Medicine

## 2021-02-03 DIAGNOSIS — E519 Thiamine deficiency, unspecified: Secondary | ICD-10-CM

## 2021-02-03 DIAGNOSIS — D538 Other specified nutritional anemias: Secondary | ICD-10-CM | POA: Insufficient documentation

## 2021-02-03 LAB — VITAMIN B1: Vitamin B1 (Thiamine): <6 nmol/L — ABNORMAL LOW (ref 8–30)

## 2021-02-03 LAB — ALDOSTERONE + RENIN ACTIVITY W/ RATIO
ALDO / PRA Ratio: 1.5 Ratio (ref 0.9–28.9)
Aldosterone: 1 ng/dL
Renin Activity: 0.65 ng/mL/h (ref 0.25–5.82)

## 2021-02-03 LAB — C-PEPTIDE: C-Peptide: 4.37 ng/mL — ABNORMAL HIGH (ref 0.80–3.85)

## 2021-02-03 MED ORDER — THIAMINE HCL 100 MG PO TABS: 100.0000 mg | ORAL_TABLET | Freq: Every day | ORAL | 0 refills | Status: DC

## 2021-02-20 ENCOUNTER — Institutional Professional Consult (permissible substitution): Payer: Managed Care, Other (non HMO) | Admitting: Neurology

## 2021-02-20 ENCOUNTER — Ambulatory Visit: Payer: Managed Care, Other (non HMO) | Admitting: Cardiology

## 2021-03-05 ENCOUNTER — Encounter: Payer: Self-pay | Admitting: Internal Medicine

## 2021-03-06 ENCOUNTER — Other Ambulatory Visit: Payer: Self-pay

## 2021-03-06 ENCOUNTER — Encounter: Payer: Self-pay | Admitting: Cardiology

## 2021-03-06 ENCOUNTER — Ambulatory Visit: Payer: Managed Care, Other (non HMO) | Admitting: Cardiology

## 2021-03-06 VITALS — BP 121/59 | HR 69 | Resp 16 | Ht 67.0 in | Wt 179.4 lb

## 2021-03-06 DIAGNOSIS — Z720 Tobacco use: Secondary | ICD-10-CM

## 2021-03-06 DIAGNOSIS — I700 Atherosclerosis of aorta: Secondary | ICD-10-CM

## 2021-03-06 DIAGNOSIS — F1010 Alcohol abuse, uncomplicated: Secondary | ICD-10-CM

## 2021-03-06 DIAGNOSIS — I10 Essential (primary) hypertension: Secondary | ICD-10-CM

## 2021-03-06 DIAGNOSIS — R9431 Abnormal electrocardiogram [ECG] [EKG]: Secondary | ICD-10-CM

## 2021-03-06 DIAGNOSIS — E785 Hyperlipidemia, unspecified: Secondary | ICD-10-CM

## 2021-03-06 DIAGNOSIS — G4733 Obstructive sleep apnea (adult) (pediatric): Secondary | ICD-10-CM

## 2021-03-06 DIAGNOSIS — R0989 Other specified symptoms and signs involving the circulatory and respiratory systems: Secondary | ICD-10-CM

## 2021-03-06 NOTE — Progress Notes (Signed)
Date:  03/06/2021   ID:  Leroy Bell, DOB February 17, 1961, MRN [id number]  PCP:  Patient, No Pcp Per (Inactive)  Cardiologist:  Tessa Lerner, DO, [facility] (established care 03/06/21)  REASON FOR CONSULT: Abnormal EKG.  REQUESTING PHYSICIAN:  Etta Grandchild, MD [address] [place],  [place] [address]  Chief Complaint  Patient presents with   Abnormal ECG   New Patient (Initial Visit)    HPI  Leroy Bell is a 60 y.o.  male who presents to the office with a chief complaint of " abnormal EKG, establish care." Patient's past medical history and cardiovascular risk factors include: OSA on CPAP, History of diabetes melitis now diet-controlled, hyperlipidemia, hypertension, alcohol use, aortic atherosclerosis, alcoholic cirrhosis without ascites.   He is referred to the office at the request of Etta Grandchild, MD for evaluation of abnormal EKG.  Patient recently establish care with Dr. Yetta Bell as his new primary and had an EKG done which was reported to be abnormal and is now referred to cardiology for further evaluation and management.  Clinically he denies any chest pain at rest or with effort related activities.  He used to have shortness of breath but this was mostly secondary to anemia and after getting PRBCs and IV iron the symptoms of shortness of breath have resolved. BNP within normal limits.   Consumes approximately 6-8 beers on a daily basis.  Smokeless tobacco since age 57.   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  ALLERGIES: Allergies  Allergen Reactions   Penicillins Hives    MEDICATION LIST PRIOR TO VISIT: Current Meds  Medication Sig   atorvastatin (LIPITOR) 10 MG tablet Take 1 tablet (10 mg total) by mouth daily.   nebivolol (BYSTOLIC) 10 MG tablet Take 1 tablet (10 mg total) by mouth daily.   olmesartan (BENICAR) 20 MG tablet Take 1 tablet (20 mg total) by  mouth daily.   thiamine 100 MG tablet Take 1 tablet (100 mg total) by mouth daily.     PAST MEDICAL HISTORY: Past Medical History:  Diagnosis Date   Alcohol abuse    Diabetes ([name])    Dyspnea    from anemia   Hyperlipidemia    Hypertension     PAST SURGICAL HISTORY: Past Surgical History:  Procedure Laterality Date   NO PAST SURGERIES      FAMILY HISTORY: The patient family history includes Cancer in his paternal grandfather; Diabetes in his father, mother, paternal grandfather, paternal grandmother, and sister; Healthy in his daughter and son; Heart attack in his father; Hypertension in his father and mother; Prostate cancer in his father.  SOCIAL HISTORY:  The patient  reports that he has never smoked. His smokeless tobacco use includes snuff. He reports current alcohol use of about 24.0 standard drinks per week. He reports that he does not currently use drugs.  REVIEW OF SYSTEMS: Review of Systems  Constitutional: Negative for chills and fever.  HENT:  Negative for hoarse voice and nosebleeds.   Eyes:  Negative for discharge, double vision and pain.  Cardiovascular:  Negative for chest pain, claudication, dyspnea on exertion, leg swelling, near-syncope, orthopnea, palpitations, paroxysmal nocturnal dyspnea and syncope.  Respiratory:  Negative for hemoptysis and shortness of breath.   Musculoskeletal:  Negative for muscle cramps and myalgias.  Gastrointestinal:  Negative for abdominal pain, constipation, diarrhea, hematemesis, hematochezia, melena, nausea and vomiting.  Neurological:  Negative  for dizziness and light-headedness.   PHYSICAL EXAM: Vitals with BMI 03/06/2021 01/29/2021 01/21/2021  Height 5\' 7"  5\' 7"  -  Weight 179 lbs 6 oz 183 lbs -  BMI [id number] -  Systolic [id number]  Diastolic 59 76 77  Pulse 69 92 80  Some encounter information is confidential and restricted. Go to Review Flowsheets activity to see all data.    CONSTITUTIONAL: Well-developed and  well-nourished. No acute distress.  SKIN: Skin is warm and dry. No rash noted. No cyanosis. No pallor. No jaundice HEAD: Normocephalic and atraumatic.  EYES: No scleral icterus MOUTH/THROAT: Moist oral membranes.  NECK: No JVD present. No thyromegaly noted. left carotid bruits  LYMPHATIC: No visible cervical adenopathy.  CHEST Normal respiratory effort. No intercostal retractions  LUNGS: Clear to auscultation bilaterally.  No stridor. No wheezes. No rales.  CARDIOVASCULAR: Regular rate and rhythm, positive S1-S2, no murmurs rubs or gallops appreciated. ABDOMINAL: Obese, soft, nontender, nondistended, positive bowel sounds all 4 quadrants. No apparent ascites.  EXTREMITIES: No peripheral edema, warm to touch, +2 DP and PT pulses HEMATOLOGIC: No significant bruising NEUROLOGIC: Oriented to person, place, and time. Nonfocal. Normal muscle tone.  PSYCHIATRIC: Normal mood and affect. Normal behavior. Cooperative  CARDIAC DATABASE: EKG: 03/06/2021: Normal sinus rhythm, 64 bpm, left axis, left anterior fascicular block, consider old inferior infarct, without underlying injury pattern.   Echocardiogram: No results found for this or any previous visit from the past 1095 days.    Stress Testing: No results found for this or any previous visit from the past 1095 days.   Heart Catheterization: None  LABORATORY DATA: CBC Latest Ref Rng & Units 01/20/2021 12/31/2020 10/01/2020  WBC 4.0 - 10.5 K/uL 4.1 3.3(L) 4.4  Hemoglobin 13.0 - 17.0 g/dL 8.1(L) 11.7(L) 8.0(L)  Hematocrit 39.0 - 52.0 % 25.8(L) 36.0(L) 25.8(L)  Platelets 150 - 400 K/uL 131(L) 65(L) 116(L)    CMP Latest Ref Rng & Units 01/29/2021 09/09/2020 07/01/2020  Glucose 70 - 99 mg/dL 151(H) 232(H) 299(H)  BUN 6 - 23 mg/dL 5(L) 5(L) 6  Creatinine 0.40 - 1.50 mg/dL 0.63 0.77 0.97  Sodium 135 - 145 mEq/L 138 136 129(L)  Potassium 3.5 - 5.1 mEq/L 3.9 3.9 4.1  Chloride 96 - 112 mEq/L 105 106 100  CO2 19 - 32 mEq/L 25 23 23   Calcium 8.4 -  10.5 mg/dL 9.1 8.6(L) 8.8(L)  Total Protein 6.0 - 8.3 g/dL 6.6 7.1 7.3  Total Bilirubin 0.2 - 1.2 mg/dL 2.0(H) 1.4(H) 1.7(H)  Alkaline Phos 39 - 117 U/L 187(H) 241(H) 198(H)  AST 0 - 37 U/L 110(H) 84(H) 51(H)  ALT 0 - 53 U/L 40 32 29    Lipid Panel  Lab Results  Component Value Date   CHOL 140 01/29/2021   HDL 49.40 01/29/2021   LDLCALC 75 01/29/2021   TRIG 77.0 01/29/2021   CHOLHDL 3 01/29/2021     No components found for: NTPROBNP Recent Labs    01/29/21 1406  PROBNP 46.0   Recent Labs    01/29/21 1406  TSH 3.28    BMP Recent Labs    04/25/20 2049 07/01/20 1117 09/09/20 0837 01/29/21 1406  NA 131* 129* 136 138  K 3.6 4.1 3.9 3.9  CL 97* 100 106 105  CO2 24 23 23 25   GLUCOSE 340* 299* 232* 151*  BUN 6 6 5* 5*  CREATININE 0.82 0.97 0.77 0.63  CALCIUM 9.0 8.8* 8.6* 9.1  GFRNONAA >60 >60 >60  --  HEMOGLOBIN A1C Lab Results  Component Value Date   HGBA1C 5.8 01/29/2021   MPG 116.89 07/29/2018    IMPRESSION:    ICD-10-CM   1. Nonspecific abnormal electrocardiogram (ECG) (EKG)  R94.31 EKG 12-Lead    PCV ECHOCARDIOGRAM COMPLETE    CT CARDIAC SCORING ([facility] LOCATIONS ONLY)    PCV MYOCARDIAL PERFUSION WO LEXISCAN    2. Atherosclerosis of aorta ([name])  I70.0 CT CARDIAC SCORING ([facility] LOCATIONS ONLY)    PCV MYOCARDIAL PERFUSION WO LEXISCAN    3. Essential hypertension  I10     4. Left carotid bruit  R09.89 PCV CAROTID DUPLEX (BILATERAL)    5. OSA on CPAP  G47.33    Z99.89     6. Hyperlipidemia LDL goal <70  E78.5     7. Alcohol abuse  F10.10     8. Smokeless tobacco use  Z72.0        RECOMMENDATIONS: Leroy Bell is a 60 y.o.  male whose past medical history and cardiac risk factors include: OSA on CPAP, History of diabetes melitis now diet-controlled, hyperlipidemia, hypertension, alcohol use, aortic atherosclerosis, alcoholic cirrhosis without ascites.   Patient presents to the office for evaluation of an abnormal EKG.  EKG notes normal  sinus rhythm with concern for possible old inferior infarct.  He denies any chest pain or shortness of breath at rest or with effort related activities.  He has multiple cardiovascular risk factors as outlined above.  His estimated 10-year risk of ASCVD is currently 9.9%.  Given his EKG findings, ASCVD risk, and comorbid conditions that shared decision was to proceed with coronary calcium score for further risk stratification, echocardiogram to evaluate for structural heart disease, and exercise nuclear stress test to evaluate for functional status and reversible ischemia.  Patient is educated on the importance of improving his modifiable cardiovascular risk factors.  Patient does have aortic atherosclerosis on imaging.  However, given his extensive alcohol use and alcoholic cirrhosis hesitant to start aspirin until he is cleared by gastroenterology to minimize risk of bleeding.  Continue statin therapy as of now.  Patient is noted to have a soft left carotid bruit.  We will check a carotid duplex for further evaluation.  From a cardiovascular standpoint patient is educated on the importance of complete cessation of alcohol or to reduce his alcohol consumption to no more than 1-2 beers per day.  And complete cessation of smokeless tobacco which he has been doing since age of 54.  Further recommendations to follow.  Thank you for allowing [place] to participate in the care of Leroy Bell please reach out if any questions or concerns arise.  FINAL MEDICATION LIST END OF ENCOUNTER: No orders of the defined types were placed in this encounter.   There are no discontinued medications.   Current Outpatient Medications:    atorvastatin (LIPITOR) 10 MG tablet, Take 1 tablet (10 mg total) by mouth daily., Disp: 90 tablet, Rfl: 1   nebivolol (BYSTOLIC) 10 MG tablet, Take 1 tablet (10 mg total) by mouth daily., Disp: 90 tablet, Rfl: 0   olmesartan (BENICAR) 20 MG tablet, Take 1 tablet (20 mg total) by  mouth daily., Disp: 90 tablet, Rfl: 0   thiamine 100 MG tablet, Take 1 tablet (100 mg total) by mouth daily., Disp: 90 tablet, Rfl: 0  Orders Placed This Encounter  Procedures   CT CARDIAC SCORING ([facility] LOCATIONS ONLY)   PCV MYOCARDIAL PERFUSION WO LEXISCAN   EKG 12-Lead   PCV ECHOCARDIOGRAM COMPLETE  PCV CAROTID DUPLEX (BILATERAL)    There are no Patient Instructions on file for this visit.   --Continue cardiac medications as reconciled in final medication list. --Return in about 6 weeks (around 04/17/2021) for Follow up abnormal EKG, follow-up test results. Or sooner if needed. --Continue follow-up with your primary care physician regarding the management of your other chronic comorbid conditions.  Patient's questions and concerns were addressed to his satisfaction. He voices understanding of the instructions provided during this encounter.   This note was created using a voice recognition software as a result there may be grammatical errors inadvertently enclosed that do not reflect the nature of this encounter. Every attempt is made to correct such errors.  Rex Kras, [place], [facility]  Pager: [phone] Office: [phone]

## 2021-03-12 ENCOUNTER — Institutional Professional Consult (permissible substitution): Payer: Managed Care, Other (non HMO) | Admitting: Neurology

## 2021-03-19 ENCOUNTER — Ambulatory Visit: Payer: Managed Care, Other (non HMO)

## 2021-03-19 ENCOUNTER — Other Ambulatory Visit: Payer: Self-pay

## 2021-03-19 DIAGNOSIS — R0989 Other specified symptoms and signs involving the circulatory and respiratory systems: Secondary | ICD-10-CM

## 2021-03-19 DIAGNOSIS — R9431 Abnormal electrocardiogram [ECG] [EKG]: Secondary | ICD-10-CM

## 2021-03-19 DIAGNOSIS — I6521 Occlusion and stenosis of right carotid artery: Secondary | ICD-10-CM

## 2021-03-19 DIAGNOSIS — I700 Atherosclerosis of aorta: Secondary | ICD-10-CM

## 2021-03-28 ENCOUNTER — Ambulatory Visit
Admission: RE | Admit: 2021-03-28 | Discharge: 2021-03-28 | Disposition: A | Discharge status: Home / Self Care | Payer: No Typology Code available for payment source | Source: Ambulatory Visit | Attending: Cardiology | Admitting: Cardiology

## 2021-03-28 ENCOUNTER — Encounter: Payer: Self-pay | Admitting: Internal Medicine

## 2021-03-28 DIAGNOSIS — I700 Atherosclerosis of aorta: Secondary | ICD-10-CM

## 2021-03-28 DIAGNOSIS — R9431 Abnormal electrocardiogram [ECG] [EKG]: Secondary | ICD-10-CM

## 2021-04-02 ENCOUNTER — Inpatient Hospital Stay: Payer: Managed Care, Other (non HMO) | Attending: Internal Medicine | Admitting: Internal Medicine

## 2021-04-02 ENCOUNTER — Inpatient Hospital Stay: Payer: Managed Care, Other (non HMO)

## 2021-04-02 ENCOUNTER — Other Ambulatory Visit: Payer: Self-pay

## 2021-04-02 VITALS — BP 142/69 | HR 70 | Temp 97.4°F | Resp 18 | Ht 67.0 in | Wt 182.4 lb

## 2021-04-02 DIAGNOSIS — D500 Iron deficiency anemia secondary to blood loss (chronic): Secondary | ICD-10-CM | POA: Diagnosis not present

## 2021-04-02 DIAGNOSIS — K922 Gastrointestinal hemorrhage, unspecified: Secondary | ICD-10-CM | POA: Diagnosis not present

## 2021-04-02 DIAGNOSIS — F1010 Alcohol abuse, uncomplicated: Secondary | ICD-10-CM | POA: Diagnosis not present

## 2021-04-02 DIAGNOSIS — D61818 Other pancytopenia: Secondary | ICD-10-CM | POA: Insufficient documentation

## 2021-04-02 DIAGNOSIS — D6959 Other secondary thrombocytopenia: Secondary | ICD-10-CM | POA: Diagnosis not present

## 2021-04-02 DIAGNOSIS — I10 Essential (primary) hypertension: Secondary | ICD-10-CM | POA: Diagnosis not present

## 2021-04-02 LAB — FERRITIN: Ferritin: 46 ng/mL (ref 24–336)

## 2021-04-02 LAB — CBC WITH DIFFERENTIAL (CANCER CENTER ONLY)
Abs Immature Granulocytes: 0.01 10*3/uL (ref 0.00–0.07)
Basophils Absolute: 0 10*3/uL (ref 0.0–0.1)
Basophils Relative: 1 %
Eosinophils Absolute: 0.2 10*3/uL (ref 0.0–0.5)
Eosinophils Relative: 6 %
HCT: 34.1 % — ABNORMAL LOW (ref 39.0–52.0)
Hemoglobin: 11.7 g/dL — ABNORMAL LOW (ref 13.0–17.0)
Immature Granulocytes: 0 %
Lymphocytes Relative: 19 %
Lymphs Abs: 0.7 10*3/uL (ref 0.7–4.0)
MCH: 29.5 pg (ref 26.0–34.0)
MCHC: 34.3 g/dL (ref 30.0–36.0)
MCV: 85.9 fL (ref 80.0–100.0)
Monocytes Absolute: 0.5 10*3/uL (ref 0.1–1.0)
Monocytes Relative: 13 %
Neutro Abs: 2 10*3/uL (ref 1.7–7.7)
Neutrophils Relative %: 61 %
Platelet Count: 83 10*3/uL — ABNORMAL LOW (ref 150–400)
RBC: 3.97 MIL/uL — ABNORMAL LOW (ref 4.22–5.81)
RDW: 20 % — ABNORMAL HIGH (ref 11.5–15.5)
WBC Count: 3.4 10*3/uL — ABNORMAL LOW (ref 4.0–10.5)
nRBC: 0 % (ref 0.0–0.2)

## 2021-04-02 LAB — IRON AND TIBC
Iron: 80 ug/dL (ref 42–163)
Saturation Ratios: 24 % (ref 20–55)
TIBC: 329 ug/dL (ref 202–409)
UIBC: 248 ug/dL (ref 117–376)

## 2021-04-02 NOTE — Progress Notes (Signed)
Called and spoke to pt, pt voiced understanding. Pt has never smoked, he did agree to cut down on the use of snuff.

## 2021-04-02 NOTE — Progress Notes (Signed)
[place] [phone]   [phone]  OFFICE PROGRESS NOTE  Patient, No Pcp Per (Inactive) No address on file  DIAGNOSIS: Microcytic anemia secondary to iron deficiency secondary to gastrointestinal blood loss.  The patient is intolerable to oral iron tablet. Bone marrow biopsy and aspirate showed no concerning underlying etiology.  PRIOR THERAPY: Iron infusion with Venofer 300 Mg IV weekly for 3 weeks  CURRENT THERAPY: None  INTERVAL HISTORY: Leroy Bell 60 y.o. male returns to the clinic today for follow-up visit.  The patient is feeling fine today with no concerning complaints.  He received 2 units of PRBCs transfusion on January 20, 2021.  He is feeling much better today with no concerning complaints.  He denied having any chest pain, shortness of breath, cough or hemoptysis.  He has no weight loss or night sweats.  He has no nausea, vomiting, diarrhea or constipation.  He is here today for evaluation with repeat CBC, iron study and ferritin.   MEDICAL HISTORY: Past Medical History:  Diagnosis Date   Alcohol abuse    Diabetes ([place])    Dyspnea    from anemia   Hyperlipidemia    Hypertension     ALLERGIES:  is allergic to penicillins.  MEDICATIONS:  Current Outpatient Medications  Medication Sig Dispense Refill   atorvastatin (LIPITOR) 10 MG tablet Take 1 tablet (10 mg total) by mouth daily. 90 tablet 1   nebivolol (BYSTOLIC) 10 MG tablet Take 1 tablet (10 mg total) by mouth daily. 90 tablet 0   olmesartan (BENICAR) 20 MG tablet Take 1 tablet (20 mg total) by mouth daily. 90 tablet 0   thiamine 100 MG tablet Take 1 tablet (100 mg total) by mouth daily. 90 tablet 0   Iron-FA-B Cmp-C-Biot-Probiotic (FUSION PLUS) CAPS 1 capsule (Patient not taking: Reported on 04/02/2021)     No current facility-administered medications for this visit.    SURGICAL HISTORY:  Past Surgical History:  Procedure Laterality Date   NO PAST  SURGERIES      REVIEW OF SYSTEMS:  A comprehensive review of systems was negative.   PHYSICAL EXAMINATION: General appearance: alert, appears stated age, and no distress Head: Normocephalic, without obvious abnormality, atraumatic Neck: no adenopathy, no JVD, supple, symmetrical, trachea midline, and thyroid not enlarged, symmetric, no tenderness/mass/nodules Lymph nodes: Cervical, supraclavicular, and axillary nodes normal. Resp: clear to auscultation bilaterally Back: symmetric, no curvature. ROM normal. No CVA tenderness. Cardio: regular rate and rhythm, S1, S2 normal, no murmur, click, rub or gallop GI: soft, non-tender; bowel sounds normal; no masses,  no organomegaly Extremities: extremities normal, atraumatic, no cyanosis or edema  ECOG PERFORMANCE STATUS: 1 - Symptomatic but completely ambulatory  Blood pressure (!) 142/69, pulse 70, temperature (!) 97.4 F (36.3 C), temperature source Tympanic, resp. rate 18, height 5' 7" (1.702 m), weight 182 lb 6.4 oz (82.7 kg), SpO2 99 %.  LABORATORY DATA: Lab Results  Component Value Date   WBC 3.4 (L) 04/02/2021   HGB 11.7 (L) 04/02/2021   HCT 34.1 (L) 04/02/2021   MCV 85.9 04/02/2021   PLT 83 (L) 04/02/2021      Chemistry      Component Value Date/Time   NA 138 01/29/2021 1406   NA 136 10/05/2018 0923   K 3.9 01/29/2021 1406   CL 105 01/29/2021 1406   CO2 25 01/29/2021 1406   BUN 5 (L) 01/29/2021 1406   BUN 6 10/05/2018 0923   CREATININE 0.63 01/29/2021  1406   CREATININE 0.77 09/09/2020 0837      Component Value Date/Time   CALCIUM 9.1 01/29/2021 1406   ALKPHOS 187 (H) 01/29/2021 1406   AST 110 (H) 01/29/2021 1406   AST 84 (H) 09/09/2020 0837   ALT 40 01/29/2021 1406   ALT 32 09/09/2020 0837   BILITOT 2.0 (H) 01/29/2021 1406   BILITOT 1.4 (H) 09/09/2020 0837       RADIOGRAPHIC STUDIES: PCV ECHOCARDIOGRAM COMPLETE  Result Date: 03/21/2021 Echocardiogram 03/19/2021: Normal LV systolic function with visual EF  60-65%. Left ventricle cavity is normal in size. Normal left ventricular wall thickness. Normal global wall motion. Normal diastolic filling pattern, normal LAP. Left atrial cavity is moderately dilated. Right atrial cavity is mildly dilated. Mild (Grade I) mitral regurgitation. No prior study for comparison.  PCV CAROTID DUPLEX (BILATERAL)  Result Date: 03/22/2021 Carotid artery duplex  03/19/2021: Duplex suggests stenosis in the right internal carotid artery (1-15%). Duplex suggests stenosis in the right common carotid artery (<50%), mild heterogeneous plaque. No evidence of significant stenosis in the left carotid vessels. Antegrade right vertebral artery flow. Antegrade left vertebral artery flow. Follow up study is appropriate if clinically indicated.  PCV MYOCARDIAL PERFUSION WO LEXISCAN  Result Date: 03/20/2021 Lexiscan Tetrofosmin stress test 03/19/2021: 1 Day Rest/Stress Protocol. Stress EKG is non-diagnostic for ischemia as it's a pharmacologic stress test using Lexiscan. Normal myocardial perfusion without convincing evidence of prior infarct or ischemia. Left ventricular cavity dilated, preserved wall thickness, calculated LVEF 72%. Low risk study. No previous exam available for comparison.  CT CARDIAC SCORING ([facility] LOCATIONS ONLY)  Result Date: 03/28/2021 EXAM: CT CARDIAC CORONARY ARTERY CALCIUM SCORE TECHNIQUE: Non-contrast imaging through the heart was performed using prospective ECG gating. Image post processing was performed on an independent workstation, allowing for quantitative analysis of the heart and coronary arteries. Note that this exam targets the heart and the chest was not imaged in its entirety. COMPARISON:  None. FINDINGS: CORONARY CALCIUM SCORES: Left Main: 125 LAD: 233 LCx: 47 RCA: 56 Total Agatston Score: 461 MESA database percentile: 92 AORTA MEASUREMENTS: Ascending Aorta: 29 mm Descending Aorta: 23 mm OTHER FINDINGS: Calcifications in the aortic root and ascending  thoracic aorta as well as distal aortic arch. No aneurysm. Heart is normal size. Small hiatal hernia. No adenopathy. No confluent opacities or effusions. Imaging into the upper abdomen demonstrates no acute findings. Chest wall soft tissues are unremarkable. No acute bony abnormality. IMPRESSION: Total Agatston score: [place]nell percentile: 92 Aortic atherosclerosis. Hiatal hernia. No acute extra cardiac abnormality. Electronically Signed   By: Rolm Baptise M.D.   On: 03/28/2021 10:45     ASSESSMENT AND PLAN: This is a very pleasant 60 years old white male with pancytopenia who was initially evaluated for iron deficiency anemia and received iron infusion with Venofer 300 Mg IV weekly for 3 weeks with no significant improvement in his hemoglobin and hematocrit. The patient had a bone marrow biopsy and aspirate performed recently that showed slightly hypercellular bone marrow for age with trilineage hematopoiesis.  The biopsy showed no feature specific or diagnostic of a primary myeloid neoplasm and may be secondary in nature as a result of nutritional deficiency, medication, alcohol, infection or immune mediated process.  The patient has a very well-known history of alcohol abuse.  The patient received 2 units of PRBCs transfusion after his last visit.  He also was treated with iron infusion with Venofer weekly for 3 weeks. The patient is currently on observation and he  is feeling fine with no concerning complaints. Repeat CBC today showed improvement of his hemoglobin up to 11.7.  He continues to have thrombocytopenia secondary to alcohol abuse. I recommended for the patient to continue on observation with repeat CBC, iron study and ferritin in 3 months.  He will be in [place] for the next 2 months and he was advised to go to the closest medical facility if he has any significant complaints. For the hypertension he will see his primary care physician for evaluation and treatment of his  condition. For the alcohol abuse, I strongly encouraged the patient to quit drinking but unfortunately he continues to drink at regular basis. The patient was advised to call immediately if he has any other concerning symptoms in the interval.  The patient voices understanding of current disease status and treatment options and is in agreement with the current care plan.  All questions were answered. The patient knows to call the clinic with any problems, questions or concerns. We can certainly see the patient much sooner if necessary.  Disclaimer: This note was dictated with voice recognition software. Similar sounding words can inadvertently be transcribed and may not be corrected upon review.

## 2021-04-03 ENCOUNTER — Encounter: Payer: Self-pay | Admitting: Internal Medicine

## 2021-04-04 ENCOUNTER — Telehealth: Payer: Self-pay | Admitting: Internal Medicine

## 2021-04-04 NOTE — Telephone Encounter (Signed)
Sch per 12/7 los, pt aware 

## 2021-04-23 ENCOUNTER — Other Ambulatory Visit: Payer: Self-pay | Admitting: Internal Medicine

## 2021-04-23 DIAGNOSIS — I10 Essential (primary) hypertension: Secondary | ICD-10-CM

## 2021-04-23 DIAGNOSIS — D538 Other specified nutritional anemias: Secondary | ICD-10-CM

## 2021-04-29 ENCOUNTER — Ambulatory Visit: Payer: Managed Care, Other (non HMO) | Admitting: Cardiology

## 2021-05-08 ENCOUNTER — Encounter: Payer: Self-pay | Admitting: Neurology

## 2021-05-08 ENCOUNTER — Ambulatory Visit: Payer: Managed Care, Other (non HMO) | Admitting: Neurology

## 2021-05-08 ENCOUNTER — Encounter: Payer: Self-pay | Admitting: Cardiology

## 2021-05-08 ENCOUNTER — Ambulatory Visit: Payer: Managed Care, Other (non HMO) | Admitting: Cardiology

## 2021-05-08 ENCOUNTER — Other Ambulatory Visit: Payer: Self-pay

## 2021-05-08 VITALS — BP 115/62 | HR 67 | Ht 67.0 in | Wt 180.5 lb

## 2021-05-08 VITALS — BP 111/51 | HR 68 | Temp 98.3°F | Resp 16 | Ht 67.0 in | Wt 182.4 lb

## 2021-05-08 DIAGNOSIS — G4733 Obstructive sleep apnea (adult) (pediatric): Secondary | ICD-10-CM

## 2021-05-08 DIAGNOSIS — Z9989 Dependence on other enabling machines and devices: Secondary | ICD-10-CM | POA: Insufficient documentation

## 2021-05-08 DIAGNOSIS — I2510 Atherosclerotic heart disease of native coronary artery without angina pectoris: Secondary | ICD-10-CM

## 2021-05-08 DIAGNOSIS — E119 Type 2 diabetes mellitus without complications: Secondary | ICD-10-CM

## 2021-05-08 DIAGNOSIS — I700 Atherosclerosis of aorta: Secondary | ICD-10-CM

## 2021-05-08 DIAGNOSIS — I10 Essential (primary) hypertension: Secondary | ICD-10-CM

## 2021-05-08 DIAGNOSIS — Z720 Tobacco use: Secondary | ICD-10-CM

## 2021-05-08 DIAGNOSIS — Z024 Encounter for examination for driving license: Secondary | ICD-10-CM

## 2021-05-08 DIAGNOSIS — M5030 Other cervical disc degeneration, unspecified cervical region: Secondary | ICD-10-CM

## 2021-05-08 DIAGNOSIS — E785 Hyperlipidemia, unspecified: Secondary | ICD-10-CM

## 2021-05-08 DIAGNOSIS — F1010 Alcohol abuse, uncomplicated: Secondary | ICD-10-CM

## 2021-05-08 MED ORDER — ATORVASTATIN CALCIUM 10 MG PO TABS: 10.0000 mg | ORAL_TABLET | Freq: Every evening | ORAL | 0 refills | Status: DC

## 2021-05-08 NOTE — Addendum Note (Signed)
Addended by: Melvyn Novas on: 05/08/2021 10:51 AM   Modules accepted: Orders

## 2021-05-08 NOTE — Progress Notes (Signed)
SLEEP MEDICINE CLINIC    Provider:  Larey Seat, MD  Primary Care Physician:  Patient, No Pcp Per (Inactive) No address on file     Referring Provider: Janith Lima, [address] [place],  [place] [id number]          Chief Complaint according to patient   Patient presents with:     New Patient (Initial Visit)           HISTORY OF PRESENT ILLNESS:  Leroy Bell is a 61 y.o.  Caucasian male patient is seen here on 05-08-2021  upon referral from Dr. Ronnald Ramp, MD  for a Sleep consultation. .  Chief concern according to patient :   Internal referral for OSA on CPAP. Pt has been on his current CPAP since 2006. Water leaks out, motor has exceeded its life and is needing a new CPAP. Pt is not able to sleep without it. His machine was from 2014(?). He was a previous patient of Dr Shelia Media.    This patient  has a past medical history of Alcohol abuse, Diabetes ([place]), Dyspnea, OSA on CPAP, CPAP dependence. Hyperlipidemia, and Hypertension..     Sleep relevant medical history: ,no Tonsillectomy, no wisdom teeth removal, , cervical spine DDD, no surgery,     Family medical /sleep history: no other family member on CPAP with OSA. 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 There is a dog in the home.  The patient currently works 24/ 7 shifts( night/ rotating,) Tobacco use; snuff   ETOH use beer- 2-4 on a football night.  Caffeine intake; none  Regular exercise- none.    Sleep habits are as follows: The patient's dinner time is between 5-9 PM. The patient goes to bed at 9-11 PM and continues to sleep on CPAP for 6-7 hours, wakes rarely for  bathroom breaks. The preferred sleep position is variable , with the support of one pillow. Dreams are reportedly frequent/vivid. Neck pain is forcing him to change position, no GERD.  6-8  AM is the usual rise time. The patient  wakes up spontaneously  He reports feeling refreshed or restored in AM, without  symptoms such as dry mouth, morning headaches, and residual fatigue. Since CPAP, these symptoms are gone.  Naps are not taken.    Review of Systems: Out of a complete 14 system review, the patient complains of only the following symptoms, and all other reviewed systems are negative.:   How likely are you to doze in the following situations: 0 = not likely, 1 = slight chance, 2 = moderate chance, 3 = high chance    ON CPAP-  Sitting and Reading? Watching Television? Sitting inactive in a public place (theater or meeting)? As a passenger in a car for an hour without a break? Lying down in the afternoon when circumstances permit? Sitting and talking to someone? Sitting quietly after lunch without alcohol? In a car, while stopped for a few minutes in traffic?   Total = 10/ 24 points   FSS endorsed at 16/ 63 points.   Social History   Socioeconomic History   Marital status: Married    Spouse name: [name]   Number of children: 2   Years of education: Not on file   Highest education level: 12th grade  Occupational History   Not on file  Tobacco Use  Smoking status: Never   Smokeless tobacco: Current    Types: Snuff  Vaping Use   Vaping Use: Never used  Substance and Sexual Activity   Alcohol use: Yes    Alcohol/week: 24.0 standard drinks    Types: 24 Cans of beer per week   Drug use: Not Currently   Sexual activity: Yes    Partners: Female    Birth control/protection: None  Other Topics Concern   Not on file  Social History Narrative   Lives at home with wife [name]   Right handed: no soda and only decaf coffee   Social Determinants of Radio broadcast assistant Strain: Not on file  Food Insecurity: Not on file  Transportation Needs: Not on file  Physical Activity: Not on file  Stress: Not on file  Social Connections: Not on file    Family History  Problem Relation Age of Onset    Diabetes Mother    Hypertension Mother    Diabetes Father    Hypertension Father    Heart attack Father    Prostate cancer Father    Diabetes Sister    Healthy Daughter    Healthy Son    Diabetes Paternal Grandmother    Diabetes Paternal Grandfather    Cancer Paternal Grandfather        unknown     Past Medical History:  Diagnosis Date   Alcohol abuse    Diabetes ([place])    Dyspnea    from anemia   Hyperlipidemia    Hypertension     Past Surgical History:  Procedure Laterality Date   NO PAST SURGERIES       Current Outpatient Medications on File Prior to Visit  Medication Sig Dispense Refill   atorvastatin (LIPITOR) 10 MG tablet Take 1 tablet (10 mg total) by mouth daily. 90 tablet 1   Iron-FA-B Cmp-C-Biot-Probiotic (FUSION PLUS) CAPS      nebivolol (BYSTOLIC) 10 MG tablet TAKE 1 TABLET BY MOUTH EVERY DAY 90 tablet 0   olmesartan (BENICAR) 20 MG tablet TAKE 1 TABLET BY MOUTH EVERY DAY 90 tablet 0   thiamine 100 MG tablet TAKE 1 TABLET BY MOUTH EVERY DAY 90 tablet 0   No current facility-administered medications on file prior to visit.    Allergies  Allergen Reactions   Penicillins Hives    Physical exam:  Today's Vitals   05/08/21 0948  BP: 115/62  Pulse: 67  Weight: 180 lb 8 oz (81.9 kg)  Height: 5\' 7"  (1.702 m)   Body mass index is 28.27 kg/m.   Wt Readings from Last 3 Encounters:  05/08/21 180 lb 8 oz (81.9 kg)  04/02/21 182 lb 6.4 oz (82.7 kg)  03/06/21 179 lb 6.4 oz (81.4 kg)     Ht Readings from Last 3 Encounters:  05/08/21 5\' 7"  (1.702 m)  04/02/21 5\' 7"  (1.702 m)  03/06/21 5\' 7"  (1.702 m)      General: The patient is awake, alert and appears not in acute distress. The patient is well groomed. Head: Normocephalic, atraumatic. Neck is supple. Mallampati: 3 plus ,  neck circumference:17.25 inches . Nasal airflow  patent.  Retrognathia is not seen.  Dental status: intact  Cardiovascular:  Regular rate and cardiac rhythm by pulse,  without  distended neck veins. Respiratory: Lungs are clear to auscultation.  Skin:  Without evidence of ankle edema, or rash. Trunk:stooped at neck.    Neurologic exam : The patient is awake and alert, oriented to place  and time.   Memory subjective described as intact.  Attention span & concentration ability appears normal.  Speech is fluent,  without  dysarthria, dysphonia or aphasia.  Mood and affect are appropriate.   Cranial nerves: no loss of smell or taste reported  Pupils are equal and briskly reactive to light. Funduscopic exam deferred. .  Extraocular movements in vertical and horizontal planes were intact and without nystagmus. No Diplopia. Visual fields by finger perimetry are intact. Hearing was intact to soft voice and finger rubbing.    Facial sensation intact to fine touch.  Facial motor strength is symmetric and tongue and uvula move midline.  Neck ROM : rotation, tilt and flexion extension were normal for age and shoulder shrug was symmetrical.    Motor exam:  Symmetric bulk, tone and ROM.   Elevated biceps  tone with cog -wheeling on the right, he has less right grip strength . Nerve pain in the right, radiating from the neck.    Sensory:  Fine touch, pinprick and vibration were tested  and  normal.  Proprioception tested in the upper extremities was normal.   Coordination: Rapid alternating movements in the fingers/hands were of normal speed.  The Finger-to-nose maneuver was intact without evidence of ataxia, dysmetria or tremor.   Gait and station: Patient could rise unassisted from a seated position, walked without assistive device.  Stance is of normal width/ base and the patient turned with 3 steps.  Toe and heel walk were deferred.  Deep tendon reflexes: in the  upper and lower extremities are symmetric and intact.  Babinski response was deferred .        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    He has been a longtime CPAP user and his current machine is well over 21 years old.  It is still working but the water reservoir would need to be replaced and is no longer available.  He is so CPAP dependent that he reportedly turned around 3 hours into a trip to retrieve his CPAP before he would go back on medication.  His wife also was highly unwilling to share the same bedroom with him before he was on CPAP therapy so loud it was his snoring and she has witnessed apneas as well.  The patient is neither excessively daytime sleepy nor fatigued he does have irregular hours given that he runs a business.  However he was much more sleepy and fatigued before CPAP was initiated.  So I would like to see his baseline I will order a home sleep test for him and then we will order an auto titration CPAP device.  His current machine could not be downloaded here in the office and I want to make sure that there are no central apneas arising, or that he has excessive associated hypoxia with sleep.  These would need additional settings or modalities to be treated.    My Plan is to proceed with:  1) we need to know what we treat- order HST for DOT documentation, patient is considered CPAP dependent .  I would like to thank Janith Lima, [place],  [place] [id number] for allowing me to meet with and to take care of this pleasant patient.   In short, HAINES MORALES is presenting with treated sleep apnea, CPAP dependent.   I plan to follow up either personally or through our NP within 2-3 months.  Electronically signed by: Larey Seat, MD  05/08/2021 10:09 AM  [facility] Board certified by The [employer] of Sleep Medicine and Diplomate of the [employer] of Sleep Medicine. Board certified In Neurology through the [place], Fellow of the [employer] of Neurology. Medical Director of [facility].

## 2021-05-08 NOTE — Patient Instructions (Signed)
Screening for Sleep Apnea Sleep apnea is a condition in which breathing pauses or becomes shallow during sleep. Sleep apnea screening is a test to determine if you are at risk for sleep apnea. The test includes a series of questions. It will only takes a few minutes. Your health care provider may ask you to have this test in preparation for surgery or as part of a physical exam. What are the symptoms of sleep apnea? Common symptoms of sleep apnea include: Snoring. Waking up often at night. Daytime sleepiness. Pauses in breathing. Choking or gasping during sleep. Irritability. Forgetfulness. Trouble thinking clearly. Depression. Personality changes. Most people with sleep apnea do not know that they have it. What are the advantages of sleep apnea screening? Getting screened for sleep apnea can help: Ensure your safety. It is important for your health care providers to know whether or not you have sleep apnea, especially if you are having surgery or have other long-term (chronic) health conditions. Improve your health and allow you to get a better night's rest. Restful sleep can help you: Have more energy. Lose weight. Improve high blood pressure. Improve diabetes management. Prevent stroke. Prevent car accidents. What happens during the screening? Screening usually includes being asked a list of questions about your sleep quality. Some questions you may be asked include: Do you snore? Is your sleep restless? Do you have daytime sleepiness? Has a partner or spouse told you that you stop breathing during sleep? Have you had trouble concentrating or memory loss? What is your age? What is your neck circumference? To measure your neck, keep your back straight and gently wrap the tape measure around your neck. Put the tape measure at the middle of your neck, between your chin and collarbone. What is your sex assigned at birth? Do you have or are you being treated for high blood  pressure? If your screening test is positive, you are at risk for the condition. Further testing may be needed to confirm a diagnosis of sleep apnea. Where to find more information You can find screening tools online or at your health care clinic. For more information about sleep apnea screening and healthy sleep, visit these websites: Centers for Disease Control and Prevention: www.cdc.gov American Sleep Apnea Association: www.sleepapnea.org Contact a health care provider if: You think that you may have sleep apnea. Summary Sleep apnea screening can help determine if you are at risk for sleep apnea. It is important for your health care providers to know whether or not you have sleep apnea, especially if you are having surgery or have other chronic health conditions. You may be asked to take a screening test for sleep apnea in preparation for surgery or as part of a physical exam. This information is not intended to replace advice given to you by your health care provider. Make sure you discuss any questions you have with your health care provider. Document Revised: 03/22/2020 Document Reviewed: 03/22/2020 Elsevier Patient Education  2022 [employer]  

## 2021-05-08 NOTE — Progress Notes (Signed)
Date:  05/08/2021   ID:  Cyd Silence, DOB [birth year], MRN [id number]  PCP:  Patient, No Pcp Per (Inactive)  Cardiologist:  Rex Kras, DO, [facility] (established care 03/06/21)  Date: 05/08/21 Last Office Visit: 03/06/2021  Chief Complaint  Patient presents with   Results   Follow-up    Coronary calcification    HPI  Leroy Bell is a 61 y.o.  male who presents to the office with a chief complaint of " coronary artery calcification discuss test results" Patient's past medical history and cardiovascular risk factors include: OSA on CPAP, History of diabetes melitis now diet-controlled, hyperlipidemia, hypertension, alcohol use, aortic atherosclerosis, alcoholic cirrhosis without ascites.   Referred to the practice by his primary care provider Dr. Ronnald Ramp due to abnormal EKG.  Initial EKG noted sinus rhythm with possible old inferior infarct and given his cardiovascular risk factors that shared decision was to proceed with additional testing.  Since last office visit patient has undergone coronary artery calcification which notes severe CAC with a total score of 461 placing him at the 92nd percentile.  Lexiscan stress test noted normal myocardial perfusion and overall low risk study.  An echocardiogram noted preserved LVEF, normal diastolic filling pattern, no significant valvular heart disease.  Clinically patient remained stable from a cardiovascular standpoint since last office visit.  Patient has started cholesterol medication and is tolerating it well without any side effects or intolerances.  Unfortunately, he continues to consume alcohol daily up to 6-8 beers per day.  He continues to consume smokeless tobacco since the age of 64.  He still has not started aspirin 81 mg p.o. daily given his history of alcoholic cirrhosis without ascites.  Patient would like to get a clearance from GI prior to starting management.  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  ALLERGIES: Allergies  Allergen Reactions   Penicillins Hives    MEDICATION LIST PRIOR TO VISIT: Current Meds  Medication Sig   atorvastatin (LIPITOR) 10 MG tablet Take 1 tablet (10 mg total) by mouth at bedtime.   Iron-FA-B Cmp-C-Biot-Probiotic (FUSION PLUS) CAPS    nebivolol (BYSTOLIC) 10 MG tablet TAKE 1 TABLET BY MOUTH EVERY DAY   olmesartan (BENICAR) 20 MG tablet TAKE 1 TABLET BY MOUTH EVERY DAY   thiamine 100 MG tablet TAKE 1 TABLET BY MOUTH EVERY DAY   [DISCONTINUED] atorvastatin (LIPITOR) 10 MG tablet Take 1 tablet (10 mg total) by mouth daily.     PAST MEDICAL HISTORY: Past Medical History:  Diagnosis Date   Alcohol abuse    Coronary atherosclerosis due to calcified coronary lesion    Diabetes ([place])    Dyspnea    from anemia   Hyperlipidemia    Hypertension     PAST SURGICAL HISTORY: Past Surgical History:  Procedure Laterality Date   NO PAST SURGERIES      FAMILY HISTORY: The patient family history includes Cancer in his paternal grandfather; Diabetes in his father, mother, paternal grandfather, paternal grandmother, and sister; Healthy in his daughter and son; Heart attack in his father; Hypertension in his father and mother; Prostate cancer in his father.  SOCIAL HISTORY:  The patient  reports that he has never smoked. His smokeless tobacco use includes snuff. He reports current alcohol use of about 24.0 standard drinks per week. He reports that he does not currently use drugs.  REVIEW OF SYSTEMS: Review of Systems  Constitutional:  Negative for chills and fever.  HENT:  Negative for hoarse voice and nosebleeds.   Eyes:  Negative for discharge, double vision and pain.  Cardiovascular:  Negative for chest pain, claudication, dyspnea on exertion, leg swelling, near-syncope, orthopnea, palpitations, paroxysmal nocturnal dyspnea and syncope.  Respiratory:  Negative for hemoptysis  and shortness of breath.   Musculoskeletal:  Negative for muscle cramps and myalgias.  Gastrointestinal:  Negative for abdominal pain, constipation, diarrhea, hematemesis, hematochezia, melena, nausea and vomiting.  Neurological:  Negative for dizziness and light-headedness.   PHYSICAL EXAM: Vitals with BMI 05/08/2021 05/08/2021 04/02/2021  Height 5' 7"  5' 7"  5' 7"   Weight 182 lbs 6 oz 180 lbs 8 oz 182 lbs 6 oz  BMI 28.56 69.48 54.62  Systolic 703 500 938  Diastolic 51 62 69  Pulse 68 67 70  Some encounter information is confidential and restricted. Go to Review Flowsheets activity to see all data.    CONSTITUTIONAL: Well-developed and well-nourished. No acute distress.  SKIN: Skin is warm and dry. No rash noted. No cyanosis. No pallor. No jaundice HEAD: Normocephalic and atraumatic.  EYES: No scleral icterus MOUTH/THROAT: Moist oral membranes.  NECK: No JVD present. No thyromegaly noted. left carotid bruits  LYMPHATIC: No visible cervical adenopathy.  CHEST Normal respiratory effort. No intercostal retractions  LUNGS: Clear to auscultation bilaterally.  No stridor. No wheezes. No rales.  CARDIOVASCULAR: Regular rate and rhythm, positive S1-S2, no murmurs rubs or gallops appreciated. ABDOMINAL: Obese, soft, nontender, nondistended, positive bowel sounds all 4 quadrants. No apparent ascites.  EXTREMITIES: No peripheral edema, warm to touch, +2 DP and PT pulses HEMATOLOGIC: No significant bruising NEUROLOGIC: Oriented to person, place, and time. Nonfocal. Normal muscle tone.  PSYCHIATRIC: Normal mood and affect. Normal behavior. Cooperative  CARDIAC DATABASE: EKG: 03/06/2021: Normal sinus rhythm, 64 bpm, left axis, left anterior fascicular block, consider old inferior infarct, without underlying injury pattern.   Echocardiogram: 03/19/2021: Normal LV systolic function with visual EF 60-65%. Left ventricle cavity is normal in size. Normal left ventricular wall thickness. Normal global  wall motion. Normal diastolic filling pattern, normal LAP.  Left atrial cavity is moderately dilated. Right atrial cavity is mildly dilated. Mild (Grade I) mitral regurgitation. No prior study for comparison.  Stress Testing: Lexiscan Tetrofosmin stress test 03/19/2021: 1 Day Rest/Stress Protocol. Stress EKG is non-diagnostic for ischemia as it's a pharmacologic stress test using Lexiscan. Normal myocardial perfusion without convincing evidence of prior infarct or ischemia. Left ventricular cavity dilated, preserved wall thickness, calculated LVEF 72%. Low risk study. No previous exam available for comparison.  Heart Catheterization: None  Carotid artery duplex 03/19/2021:  Duplex suggests stenosis in the right internal carotid artery (1-15%). Duplex suggests stenosis in the right common carotid artery (<50%), mild heterogeneous plaque. No evidence of significant stenosis in the left carotid vessels. Antegrade right vertebral artery flow. Antegrade left vertebral artery flow. Follow up study is appropriate if clinically indicated.  CT Cardiac Scoring: 03/28/2021: Left Main: 125 LAD: 233 LCx: 47 RCA: 56 Total Agatston Score: [place] percentile: 92 Aortic atherosclerosis. Hiatal hernia  LABORATORY DATA: CBC Latest Ref Rng & Units 04/02/2021 01/20/2021 12/31/2020  WBC 4.0 - 10.5 K/uL 3.4(L) 4.1 3.3(L)  Hemoglobin 13.0 - 17.0 g/dL 11.7(L) 8.1(L) 11.7(L)  Hematocrit 39.0 - 52.0 % 34.1(L) 25.8(L) 36.0(L)  Platelets 150 - 400 K/uL 83(L) 131(L) 65(L)    CMP Latest Ref Rng & Units 01/29/2021 09/09/2020 07/01/2020  Glucose 70 - 99 mg/dL 151(H) 232(H) 299(H)  BUN 6 - 23  mg/dL 5(L) 5(L) 6  Creatinine 0.40 - 1.50 mg/dL 0.63 0.77 0.97  Sodium 135 - 145 mEq/L 138 136 129(L)  Potassium 3.5 - 5.1 mEq/L 3.9 3.9 4.1  Chloride 96 - 112 mEq/L 105 106 100  CO2 19 - 32 mEq/L 25 23 23   Calcium 8.4 - 10.5 mg/dL 9.1 8.6(L) 8.8(L)  Total Protein 6.0 - 8.3 g/dL 6.6 7.1 7.3  Total Bilirubin  0.2 - 1.2 mg/dL 2.0(H) 1.4(H) 1.7(H)  Alkaline Phos 39 - 117 U/L 187(H) 241(H) 198(H)  AST 0 - 37 U/L 110(H) 84(H) 51(H)  ALT 0 - 53 U/L 40 32 29    Lipid Panel  Lab Results  Component Value Date   CHOL 140 01/29/2021   HDL 49.40 01/29/2021   LDLCALC 75 01/29/2021   TRIG 77.0 01/29/2021   CHOLHDL 3 01/29/2021     No components found for: NTPROBNP Recent Labs    01/29/21 1406  PROBNP 46.0   Recent Labs    01/29/21 1406  TSH 3.28    BMP Recent Labs    07/01/20 1117 09/09/20 0837 01/29/21 1406  NA 129* 136 138  K 4.1 3.9 3.9  CL 100 106 105  CO2 23 23 25   GLUCOSE 299* 232* 151*  BUN 6 5* 5*  CREATININE 0.97 0.77 0.63  CALCIUM 8.8* 8.6* 9.1  GFRNONAA >60 >60  --     HEMOGLOBIN A1C Lab Results  Component Value Date   HGBA1C 5.8 01/29/2021   MPG 116.89 07/29/2018    IMPRESSION:    ICD-10-CM   1. Coronary atherosclerosis due to calcified coronary lesion  I25.10 atorvastatin (LIPITOR) 10 MG tablet   I25.84 Lipid Panel With LDL/HDL Ratio    LDL cholesterol, direct    CMP14+EGFR    2. Atherosclerosis of aorta ([name])  I70.0 atorvastatin (LIPITOR) 10 MG tablet    Lipid Panel With LDL/HDL Ratio    LDL cholesterol, direct    CMP14+EGFR    3. Essential hypertension  I10     4. OSA on CPAP  G47.33    Z99.89     5. Hyperlipidemia LDL goal <70  E78.5     6. Alcohol abuse  F10.10     7. Smokeless tobacco use  Z72.0        RECOMMENDATIONS: PRITHVI KOOI is a 61 y.o.  male whose past medical history and cardiac risk factors include: OSA on CPAP, History of diabetes melitis now diet-controlled, hyperlipidemia, hypertension, alcohol use, aortic atherosclerosis, alcoholic cirrhosis without ascites.   Coronary atherosclerosis due to calcified coronary lesion / Atherosclerosis of aorta [facility]): Total CAC 461, 92nd percentile Echo: LVEF 60 to 71%, normal diastolic function, additional details noted above. MPI: Low risk stress test additional details noted  above. Educated on importance of improving his modifiable cardiovascular risk factors. Continue to monitor Continue statin therapy Recommend starting aspirin 81 mg p.o. daily once cleared by GI.  Essential hypertension Office blood pressures are within acceptable range. Medications reconciled. Currently managed by primary care provider.  OSA on CPAP Reemphasized the importance of CPAP compliance.  Hyperlipidemia LDL goal <70 Continue atorvastatin. We will recheck fasting lipid profile prior to his next office visit.  Alcohol abuse Educated on the importance of complete alcohol cessation  Smokeless tobacco use Educated in the importance of complete nicotine cessation.  FINAL MEDICATION LIST END OF ENCOUNTER: Meds ordered this encounter  Medications   atorvastatin (LIPITOR) 10 MG tablet    Sig: Take 1 tablet (10 mg total) by  mouth at bedtime.    Dispense:  90 tablet    Refill:  0     Medications Discontinued During This Encounter  Medication Reason   atorvastatin (LIPITOR) 10 MG tablet Dose change     Current Outpatient Medications:    atorvastatin (LIPITOR) 10 MG tablet, Take 1 tablet (10 mg total) by mouth at bedtime., Disp: 90 tablet, Rfl: 0   Iron-FA-B Cmp-C-Biot-Probiotic (FUSION PLUS) CAPS, , Disp: , Rfl:    nebivolol (BYSTOLIC) 10 MG tablet, TAKE 1 TABLET BY MOUTH EVERY DAY, Disp: 90 tablet, Rfl: 0   olmesartan (BENICAR) 20 MG tablet, TAKE 1 TABLET BY MOUTH EVERY DAY, Disp: 90 tablet, Rfl: 0   thiamine 100 MG tablet, TAKE 1 TABLET BY MOUTH EVERY DAY, Disp: 90 tablet, Rfl: 0  Orders Placed This Encounter  Procedures   Lipid Panel With LDL/HDL Ratio   LDL cholesterol, direct   CMP14+EGFR    There are no Patient Instructions on file for this visit.   --Continue cardiac medications as reconciled in final medication list. --Return in about 6 months (around 11/21/2021) for Follow up, Coronary artery calcification, Lipid. Or sooner if needed. --Continue follow-up  with your primary care physician regarding the management of your other chronic comorbid conditions.  Patient's questions and concerns were addressed to his satisfaction. He voices understanding of the instructions provided during this encounter.   This note was created using a voice recognition software as a result there may be grammatical errors inadvertently enclosed that do not reflect the nature of this encounter. Every attempt is made to correct such errors.  Rex Kras, [place], [facility]  Pager: [phone] Office: [phone]

## 2021-05-16 ENCOUNTER — Other Ambulatory Visit: Payer: Self-pay

## 2021-05-16 DIAGNOSIS — I2584 Coronary atherosclerosis due to calcified coronary lesion: Secondary | ICD-10-CM

## 2021-05-16 DIAGNOSIS — I2510 Atherosclerotic heart disease of native coronary artery without angina pectoris: Secondary | ICD-10-CM

## 2021-05-16 DIAGNOSIS — I700 Atherosclerosis of aorta: Secondary | ICD-10-CM

## 2021-05-28 ENCOUNTER — Telehealth: Payer: Self-pay | Admitting: Neurology

## 2021-05-28 ENCOUNTER — Ambulatory Visit (INDEPENDENT_AMBULATORY_CARE_PROVIDER_SITE_OTHER): Payer: Managed Care, Other (non HMO) | Admitting: Neurology

## 2021-05-28 DIAGNOSIS — Z024 Encounter for examination for driving license: Secondary | ICD-10-CM

## 2021-05-28 DIAGNOSIS — I10 Essential (primary) hypertension: Secondary | ICD-10-CM

## 2021-05-28 DIAGNOSIS — Z9989 Dependence on other enabling machines and devices: Secondary | ICD-10-CM

## 2021-05-28 DIAGNOSIS — G4733 Obstructive sleep apnea (adult) (pediatric): Secondary | ICD-10-CM

## 2021-05-28 DIAGNOSIS — M5030 Other cervical disc degeneration, unspecified cervical region: Secondary | ICD-10-CM

## 2021-05-28 NOTE — Telephone Encounter (Signed)
Pt will need to complete the HST first.  Looks like we sent a HST to the pt on 05/08/2021. I will reach out to the sleep lab for them to check on the status of this.

## 2021-05-28 NOTE — Telephone Encounter (Signed)
Pt left a vm asking for a call for the status of him qualifying for a new CPAP, please call.

## 2021-06-03 NOTE — Progress Notes (Signed)
° °  °  °[facility] °  °HOME SLEEP TEST REPORT ( by Watch PAT)   °STUDY DATE:  06-02-2021 ° °  °ORDERING CLINICIAN: Carmen Dohmeier, MD  °Dr Tolia Sunit, DO °Dr Thomas Jones, MD  ° °  °CLINICAL INFORMATION/HISTORY: seen here on 05-08-2021  upon referral from Dr. Jones, MD  for a Sleep consultation. .  °Chief concern according to patient :   Internal referral for new OSA patient  , dependent on CPAP. Pt has been on his current CPAP since 2006. Water leaks out, motor has exceeded its life and is needing a new CPAP. Pt is not able to sleep without it. His machine was from 2014(?). He was a previous patient of Dr Pharr.  °  °This patient  has a past medical history of Alcohol abuse, Diabetes ([name]), Dyspnea, OSA on CPAP, CPAP dependence. Hyperlipidemia, and Hypertension..  ° °Epworth sleepiness score:  ° °10/24. On CPAP  °  °BMI: 28.4 kg/m² °  °Neck Circumference: 17" °  °FINDINGS: °  °Sleep Summary: °  °Total Recording Time (hours, min): 9 hours and 53 minutes of which 8 hours and 8 minutes was a total calculated sleep time.  REM sleep proportion was 24.1%.      °                               °  °Respiratory Indices: °  °Calculated pAHI (per hour): 10.8/h                         °  °REM pAHI:    13.8/h                                           °  °NREM pAHI:     9.9/h                       °  °Positional AHI: The AHI was highest in supine sleep position with an AHI of 15.2 followed by right-sided sleep with an AHI of 11.8.  Sleeping on the left side was associated with an AHI of only 2.5/h. ° °Snoring level mean volume was 41 dB snoring was only present for 22% of total sleep time.  Highest snoring volume reached 60 dB.                                                °  °Oxygen Saturation Statistics: °   °O2 Saturation Range (%): Between 85 and 98% saturation with a mean saturation at 94%.                                     °  °O2 Saturation (minutes) <89%:      1.5 minutes.  °Pulse Rate Statistics: ° °Pulse  Range:       Between 53 and 92 bpm with a mean heart rate of 63 bpm.  Please note that this device cannot identify cardiac rhythm only heart rate.        °  °IMPRESSION:    This HST confirms the presence of mild sleep apnea, mild to moderate snoring and no hypoxia of significance. 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  For this reason we should continue CPAP therapy sleep was very fragmented during this home sleep test indicating that the patient probably felt impaired.  The patient was the owner operator of a trucking company and is also followed for apnea by the [employer].  I will order an auto titration CPAP device between 6 and 12 cm water pressure.  2 cm EPR and mask of patient's choice.  I recommend a ResMed device. ° °  °INTERPRETING PHYSICIAN: ° ° Carmen Dohmeier, MD  ° °Medical Director of [facility].  ° ° ° ° ° ° ° ° ° ° ° ° ° ° ° ° ° ° ° °

## 2021-06-06 ENCOUNTER — Encounter: Payer: Self-pay | Admitting: Neurology

## 2021-06-16 ENCOUNTER — Other Ambulatory Visit: Payer: Self-pay | Admitting: Neurology

## 2021-06-16 DIAGNOSIS — Z9989 Dependence on other enabling machines and devices: Secondary | ICD-10-CM

## 2021-06-16 NOTE — Procedures (Signed)
[facility] TEST REPORT ( by Watch PAT)   STUDY DATE:  06-02-2021    ORDERING CLINICIAN: Larey Seat, MD  Dr Rex Kras, DO Dr Scarlette Calico, MD     CLINICAL INFORMATION/HISTORY: seen here on 05-08-2021  upon referral from Dr. Ronnald Ramp, MD  for a Sleep consultation. .  Chief concern according to patient :   Internal referral for new OSA patient  , dependent on CPAP. Pt has been on his current CPAP since 2006. Water leaks out, motor has exceeded its life and is needing a new CPAP. Pt is not able to sleep without it. His machine was from 2014(?). He was a previous patient of Dr Shelia Media.    This patient  has a past medical history of Alcohol abuse, Diabetes ([place]), Dyspnea, OSA on CPAP, CPAP dependence. Hyperlipidemia, and Hypertension.Marland Kitchen   Epworth sleepiness score:   10/24. On CPAP    BMI: 28.4 kg/m   Neck Circumference: 17"   FINDINGS:   Sleep Summary:   Total Recording Time (hours, min): 9 hours and 53 minutes of which 8 hours and 8 minutes was a total calculated sleep time.  REM sleep proportion was 24.1%.                                       Respiratory Indices:   Calculated pAHI (per hour): 10.8/h                           REM pAHI:    13.8/h                                             NREM pAHI:     9.9/h                         Positional AHI: The AHI was highest in supine sleep position with an AHI of 15.2 followed by right-sided sleep with an AHI of 11.8.  Sleeping on the left side was associated with an AHI of only 2.5/h.  Snoring level mean volume was 41 dB snoring was only present for 22% of total sleep time.  Highest snoring volume reached 60 dB.                                                  Oxygen Saturation Statistics:    O2 Saturation Range (%): Between 85 and 98% saturation with a mean saturation at 94%.                                       O2 Saturation (minutes) <89%:      1.5 minutes.  Pulse Rate Statistics:  Pulse  Range:       Between 53 and 92 bpm with a mean heart rate of 63 bpm.  Please note that this device cannot identify cardiac rhythm only heart rate.          IMPRESSION:  This HST confirms the presence of mild sleep apnea, mild to moderate snoring and no hypoxia of significance.   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  For this reason we should continue CPAP therapy sleep was very fragmented during this home sleep test indicating that the patient probably felt impaired.  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  I will order an auto titration CPAP device between 6 and 12 cm water pressure.  2 cm EPR and mask of patient's choice.  I recommend a ResMed device.    INTERPRETING PHYSICIAN:   Larey Seat, MD   Medical Director of [facility] at [facility].

## 2021-06-16 NOTE — Progress Notes (Signed)
IMPRESSION:  This HST confirms the presence of mild sleep apnea, mild to moderate snoring and no hypoxia of significance.  RECOMMENDATION: The patient is considered CPAP dependent and states that he cannot sleep well without his CPAP.   For this reason we should continue CPAP therapy sleep was very fragmented during this home sleep test indicating that the patient probably felt impaired.  The patient was the Therapist, nutritional of a trucking company and is also followed for apnea by the [employer].  I will order an auto titration CPAP device between 6 and 12 cm water pressure.  2 cm EPR and mask of patient's choice.  I recommend a ResMed device.   INTERPRETING PHYSICIAN:   Melvyn Novas, MD

## 2021-07-02 ENCOUNTER — Inpatient Hospital Stay: Payer: Managed Care, Other (non HMO)

## 2021-07-02 ENCOUNTER — Inpatient Hospital Stay: Payer: Managed Care, Other (non HMO) | Admitting: Internal Medicine

## 2021-07-08 ENCOUNTER — Other Ambulatory Visit: Payer: Self-pay | Admitting: Gastroenterology

## 2021-07-08 DIAGNOSIS — K869 Disease of pancreas, unspecified: Secondary | ICD-10-CM

## 2021-07-16 ENCOUNTER — Ambulatory Visit: Payer: No Typology Code available for payment source | Admitting: Internal Medicine

## 2021-07-16 ENCOUNTER — Other Ambulatory Visit: Payer: No Typology Code available for payment source

## 2021-07-22 ENCOUNTER — Other Ambulatory Visit: Payer: Self-pay | Admitting: Internal Medicine

## 2021-07-22 DIAGNOSIS — I10 Essential (primary) hypertension: Secondary | ICD-10-CM

## 2021-07-22 DIAGNOSIS — E519 Thiamine deficiency, unspecified: Secondary | ICD-10-CM

## 2021-08-19 ENCOUNTER — Other Ambulatory Visit: Payer: Self-pay

## 2021-08-19 DIAGNOSIS — D500 Iron deficiency anemia secondary to blood loss (chronic): Secondary | ICD-10-CM

## 2021-08-20 ENCOUNTER — Inpatient Hospital Stay: Payer: Managed Care, Other (non HMO) | Admitting: Internal Medicine

## 2021-08-20 ENCOUNTER — Inpatient Hospital Stay: Payer: Managed Care, Other (non HMO)

## 2021-08-20 DIAGNOSIS — D500 Iron deficiency anemia secondary to blood loss (chronic): Secondary | ICD-10-CM

## 2021-08-26 ENCOUNTER — Other Ambulatory Visit: Payer: Self-pay

## 2021-08-26 ENCOUNTER — Inpatient Hospital Stay (HOSPITAL_BASED_OUTPATIENT_CLINIC_OR_DEPARTMENT_OTHER): Payer: Managed Care, Other (non HMO) | Admitting: Internal Medicine

## 2021-08-26 ENCOUNTER — Inpatient Hospital Stay: Payer: Managed Care, Other (non HMO) | Attending: Internal Medicine

## 2021-08-26 VITALS — BP 140/74 | HR 67 | Temp 97.3°F | Resp 18 | Wt 181.4 lb

## 2021-08-26 DIAGNOSIS — K922 Gastrointestinal hemorrhage, unspecified: Secondary | ICD-10-CM | POA: Diagnosis not present

## 2021-08-26 DIAGNOSIS — D61818 Other pancytopenia: Secondary | ICD-10-CM | POA: Insufficient documentation

## 2021-08-26 DIAGNOSIS — D6959 Other secondary thrombocytopenia: Secondary | ICD-10-CM | POA: Insufficient documentation

## 2021-08-26 DIAGNOSIS — Z79899 Other long term (current) drug therapy: Secondary | ICD-10-CM | POA: Insufficient documentation

## 2021-08-26 DIAGNOSIS — I10 Essential (primary) hypertension: Secondary | ICD-10-CM | POA: Insufficient documentation

## 2021-08-26 DIAGNOSIS — D500 Iron deficiency anemia secondary to blood loss (chronic): Secondary | ICD-10-CM | POA: Insufficient documentation

## 2021-08-26 DIAGNOSIS — F10188 Alcohol abuse with other alcohol-induced disorder: Secondary | ICD-10-CM | POA: Insufficient documentation

## 2021-08-26 LAB — CBC WITH DIFFERENTIAL (CANCER CENTER ONLY)
Abs Immature Granulocytes: 0.01 10*3/uL (ref 0.00–0.07)
Basophils Absolute: 0 10*3/uL (ref 0.0–0.1)
Basophils Relative: 1 %
Eosinophils Absolute: 0.1 10*3/uL (ref 0.0–0.5)
Eosinophils Relative: 5 %
HCT: 36.4 % — ABNORMAL LOW (ref 39.0–52.0)
Hemoglobin: 12.8 g/dL — ABNORMAL LOW (ref 13.0–17.0)
Immature Granulocytes: 0 %
Lymphocytes Relative: 21 %
Lymphs Abs: 0.6 10*3/uL — ABNORMAL LOW (ref 0.7–4.0)
MCH: 33.9 pg (ref 26.0–34.0)
MCHC: 35.2 g/dL (ref 30.0–36.0)
MCV: 96.3 fL (ref 80.0–100.0)
Monocytes Absolute: 0.3 10*3/uL (ref 0.1–1.0)
Monocytes Relative: 12 %
Neutro Abs: 1.7 10*3/uL (ref 1.7–7.7)
Neutrophils Relative %: 61 %
Platelet Count: 70 10*3/uL — ABNORMAL LOW (ref 150–400)
RBC: 3.78 MIL/uL — ABNORMAL LOW (ref 4.22–5.81)
RDW: 12.7 % (ref 11.5–15.5)
WBC Count: 2.8 10*3/uL — ABNORMAL LOW (ref 4.0–10.5)
nRBC: 0 % (ref 0.0–0.2)

## 2021-08-26 LAB — IRON AND IRON BINDING CAPACITY (CC-WL,HP ONLY)
Iron: 128 ug/dL (ref 45–182)
Saturation Ratios: 48 % — ABNORMAL HIGH (ref 17.9–39.5)
TIBC: 269 ug/dL (ref 250–450)
UIBC: 141 ug/dL (ref 117–376)

## 2021-08-26 LAB — FERRITIN: Ferritin: 212 ng/mL (ref 24–336)

## 2021-08-26 NOTE — Progress Notes (Signed)
?    Leroy Bell ?[phone]   [phone] ? ?OFFICE PROGRESS NOTE ? ?Patient, No Pcp Per (Inactive) ?No address on file ? ?DIAGNOSIS: Microcytic anemia secondary to iron deficiency secondary to gastrointestinal blood loss.  The patient is intolerable to oral iron tablet. ?Bone marrow biopsy and aspirate showed no concerning underlying etiology. ? ?PRIOR THERAPY: Iron infusion with Venofer 300 Mg IV weekly for 3 weeks ? ?CURRENT THERAPY: None ? ?INTERVAL HISTORY: ?Leroy Bell 61 y.o. male returns to the clinic today for follow-up visit.  The patient was in [place] for the last 4 months and he has been doing well with no concerning issues.  He denied having any bleeding, bruises or ecchymosis except for occasional minor nosebleed after wearing the CPAP in the evening.  He denied having any chest pain, shortness of breath, cough or hemoptysis.  He has no nausea, vomiting, diarrhea or constipation.  He has no headache or visual changes.  Unfortunately continues to drink alcohol at regular basis.  He is here today for evaluation and repeat blood work. ? ? ?MEDICAL HISTORY: ?Past Medical History:  ?Diagnosis Date  ? Alcohol abuse   ? Coronary atherosclerosis due to calcified coronary lesion   ? Diabetes ([place])   ? Dyspnea   ? from anemia  ? Hyperlipidemia   ? Hypertension   ? ? ?ALLERGIES:  is allergic to penicillins. ? ?MEDICATIONS:  ?Current Outpatient Medications  ?Medication Sig Dispense Refill  ? atorvastatin (LIPITOR) 10 MG tablet Take 1 tablet (10 mg total) by mouth at bedtime. 90 tablet 0  ? [facility]S B-1 100 MG tablet TAKE 1 TABLET BY MOUTH EVERY DAY 90 tablet 0  ? Iron-FA-B Cmp-C-Biot-Probiotic (FUSION PLUS) CAPS     ? nebivolol (BYSTOLIC) 10 MG tablet TAKE 1 TABLET BY MOUTH EVERY DAY 90 tablet 0  ? olmesartan (BENICAR) 20 MG tablet TAKE 1 TABLET BY MOUTH EVERY DAY 90 tablet 0  ? ?No current facility-administered medications for this visit.  ? ? ?SURGICAL HISTORY:  ?Past  Surgical History:  ?Procedure Laterality Date  ? NO PAST SURGERIES    ? ? ?REVIEW OF SYSTEMS:  A comprehensive review of systems was negative.  ? ?PHYSICAL EXAMINATION: General appearance: alert, appears stated age, and no distress ?Head: Normocephalic, without obvious abnormality, atraumatic ?Neck: no adenopathy, no JVD, supple, symmetrical, trachea midline, and thyroid not enlarged, symmetric, no tenderness/mass/nodules ?Lymph nodes: Cervical, supraclavicular, and axillary nodes normal. ?Resp: clear to auscultation bilaterally ?Back: symmetric, no curvature. ROM normal. No CVA tenderness. ?Cardio: regular rate and rhythm, S1, S2 normal, no murmur, click, rub or gallop ?GI: soft, non-tender; bowel sounds normal; no masses,  no organomegaly ?Extremities: extremities normal, atraumatic, no cyanosis or edema ? ?ECOG PERFORMANCE STATUS: 1 - Symptomatic but completely ambulatory ? ?Blood pressure 140/74, pulse 67, temperature (!) 97.3 ?F (36.3 ?C), temperature source Tympanic, resp. rate 18, weight 181 lb 6 oz (82.3 kg), SpO2 100 %. ? ?LABORATORY DATA: ?Lab Results  ?Component Value Date  ? WBC 2.8 (L) 08/26/2021  ? HGB 12.8 (L) 08/26/2021  ? HCT 36.4 (L) 08/26/2021  ? MCV 96.3 08/26/2021  ? PLT 70 (L) 08/26/2021  ? ? ?  Chemistry   ?   ?Component Value Date/Time  ? NA 138 01/29/2021 1406  ? NA 136 10/05/2018 0923  ? K 3.9 01/29/2021 1406  ? CL 105 01/29/2021 1406  ? CO2 25 01/29/2021 1406  ? BUN 5 (L) 01/29/2021 1406  ? BUN 6 10/05/2018  7412  ? CREATININE 0.63 01/29/2021 1406  ? CREATININE 0.77 09/09/2020 0837  ?    ?Component Value Date/Time  ? CALCIUM 9.1 01/29/2021 1406  ? ALKPHOS 187 (H) 01/29/2021 1406  ? AST 110 (H) 01/29/2021 1406  ? AST 84 (H) 09/09/2020 0837  ? ALT 40 01/29/2021 1406  ? ALT 32 09/09/2020 0837  ? BILITOT 2.0 (H) 01/29/2021 1406  ? BILITOT 1.4 (H) 09/09/2020 8786  ?  ? ? ? ?RADIOGRAPHIC STUDIES: ?No results found. ? ? ?ASSESSMENT AND PLAN: This is a very pleasant 61 years old white male with  pancytopenia who was initially evaluated for iron deficiency anemia and received iron infusion with Venofer 300 Mg IV weekly for 3 weeks with no significant improvement in his hemoglobin and hematocrit. ?The patient had a bone marrow biopsy and aspirate performed recently that showed slightly hypercellular bone marrow for age with trilineage hematopoiesis.  The biopsy showed no feature specific or diagnostic of a primary myeloid neoplasm and may be secondary in nature as a result of nutritional deficiency, medication, alcohol, infection or immune mediated process.  The patient has a very well-known history of alcohol abuse.  ?He has been in observation for the last 4 months with no concerning complaints. ?Repeat CBC today showed improvement of his hemoglobin up to 12.8 and hematocrit 36.4.  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 ?I recommended for the patient to continue on observation with repeat CBC, iron study and ferritin in 4 months.   ?For the hypertension he will see his primary care physician for evaluation and treatment of his condition. ?For the alcohol abuse, I strongly encouraged the patient to quit drinking but unfortunately he continues to drink at regular basis. ?He was advised to call immediately if he has any concerning symptoms in the interval. ? ?The patient voices understanding of current disease status and treatment options and is in agreement with the current care plan. ? ?All questions were answered. The patient knows to call the clinic with any problems, questions or concerns. We can certainly see the patient much sooner if necessary. ? ?Disclaimer: This note was dictated with voice recognition software. Similar sounding words can inadvertently be transcribed and may not be corrected upon review. ? ? ?  ?  ?

## 2021-10-14 ENCOUNTER — Other Ambulatory Visit: Payer: Self-pay | Admitting: Internal Medicine

## 2021-10-14 DIAGNOSIS — E519 Thiamine deficiency, unspecified: Secondary | ICD-10-CM

## 2021-10-15 ENCOUNTER — Other Ambulatory Visit: Payer: Self-pay | Admitting: Cardiology

## 2021-10-15 DIAGNOSIS — I700 Atherosclerosis of aorta: Secondary | ICD-10-CM

## 2021-10-15 DIAGNOSIS — I2510 Atherosclerotic heart disease of native coronary artery without angina pectoris: Secondary | ICD-10-CM

## 2021-10-17 ENCOUNTER — Other Ambulatory Visit: Payer: Self-pay | Admitting: Internal Medicine

## 2021-10-17 DIAGNOSIS — I10 Essential (primary) hypertension: Secondary | ICD-10-CM

## 2021-10-22 ENCOUNTER — Telehealth: Payer: Self-pay | Admitting: Internal Medicine

## 2021-10-22 NOTE — Telephone Encounter (Signed)
Called patient regarding upcoming August appointments, patient has been called and notified. 

## 2021-10-27 ENCOUNTER — Other Ambulatory Visit: Payer: Self-pay | Admitting: Internal Medicine

## 2021-10-27 DIAGNOSIS — D538 Other specified nutritional anemias: Secondary | ICD-10-CM

## 2021-10-27 DIAGNOSIS — I10 Essential (primary) hypertension: Secondary | ICD-10-CM

## 2021-10-29 ENCOUNTER — Other Ambulatory Visit: Payer: Self-pay | Admitting: Internal Medicine

## 2021-10-29 ENCOUNTER — Telehealth: Payer: Self-pay | Admitting: General Practice

## 2021-10-29 DIAGNOSIS — I10 Essential (primary) hypertension: Secondary | ICD-10-CM

## 2021-10-29 DIAGNOSIS — E519 Thiamine deficiency, unspecified: Secondary | ICD-10-CM

## 2021-10-29 NOTE — Telephone Encounter (Signed)
Caller & Relationship to patient: [facility]. Spouse  Call back number: [phone]  Date of last office visit: 01/29/21  Date of next office visit:   Medication(s) to be refilled: nebivolol (BYSTOLIC) 10 MG tablet olmesartan (BENICAR) 20 MG tablet       Preferred Pharmacy:  [id number] [place], [place] - [address] Phone:  [phone]  Fax:  [phone]

## 2021-11-04 ENCOUNTER — Encounter: Payer: Self-pay | Admitting: Family Medicine

## 2021-11-04 ENCOUNTER — Ambulatory Visit (INDEPENDENT_AMBULATORY_CARE_PROVIDER_SITE_OTHER): Payer: Managed Care, Other (non HMO) | Admitting: Family Medicine

## 2021-11-04 VITALS — BP 146/74 | HR 82 | Temp 97.8°F | Ht 67.0 in | Wt 179.0 lb

## 2021-11-04 DIAGNOSIS — E519 Thiamine deficiency, unspecified: Secondary | ICD-10-CM

## 2021-11-04 DIAGNOSIS — R011 Cardiac murmur, unspecified: Secondary | ICD-10-CM

## 2021-11-04 DIAGNOSIS — I10 Essential (primary) hypertension: Secondary | ICD-10-CM | POA: Diagnosis not present

## 2021-11-04 DIAGNOSIS — Z79899 Other long term (current) drug therapy: Secondary | ICD-10-CM

## 2021-11-04 DIAGNOSIS — D538 Other specified nutritional anemias: Secondary | ICD-10-CM

## 2021-11-04 MED ORDER — NEBIVOLOL HCL 10 MG PO TABS: 10.0000 mg | ORAL_TABLET | Freq: Every day | ORAL | 0 refills | Status: DC

## 2021-11-04 MED ORDER — THIAMINE HCL 100 MG PO TABS: 100.0000 mg | ORAL_TABLET | Freq: Every day | ORAL | 0 refills | Status: DC

## 2021-11-04 MED ORDER — OLMESARTAN MEDOXOMIL 20 MG PO TABS: 20.0000 mg | ORAL_TABLET | Freq: Every day | ORAL | 0 refills | Status: DC

## 2021-11-04 NOTE — Assessment & Plan Note (Signed)
Blood pressure has improved at home per patient.  Refilled medications.  Continue olmesartan 20 mg and nebivolol 10 mg daily.  DASH diet handout provided.  Follow-up with cardiology in 2 days as recommended.  Due for labs with cardiology and will get these today if possible or at his upcoming appointment.  Follow-up here with Dr. Yetta Barre in 3 months.

## 2021-11-04 NOTE — Assessment & Plan Note (Signed)
Per patient this is not new.  However, I do not see a history of heart murmur documented in his chart.  I will send a message to his cardiologist who he sees on 11/06/2021.  Asymptomatic.

## 2021-11-04 NOTE — Assessment & Plan Note (Signed)
Followed by Dr. Arbutus Ped.  Request refill of vitamin B1 today.  Refill provided.  Order for thiamine level placed.  Follow-up with Dr. Yetta Barre in 3 months.

## 2021-11-04 NOTE — Patient Instructions (Addendum)
Please go to the lab downstairs before you leave today.   Follow up with Dr. Yetta Barre in 3 months.   DASH Eating Plan DASH stands for Dietary Approaches to Stop Hypertension. The DASH eating plan is a healthy eating plan that has been shown to: Reduce high blood pressure (hypertension). Reduce your risk for type 2 diabetes, heart disease, and stroke. Help with weight loss. What are tips for following this plan? Reading food labels Check food labels for the amount of salt (sodium) per serving. Choose foods with less than 5 percent of the Daily Value of sodium. Generally, foods with less than 300 milligrams (mg) of sodium per serving fit into this eating plan. To find whole grains, look for the word "whole" as the first word in the ingredient list. Shopping Buy products labeled as "low-sodium" or "no salt added." Buy fresh foods. Avoid canned foods and pre-made or frozen meals. Cooking Avoid adding salt when cooking. Use salt-free seasonings or herbs instead of table salt or sea salt. Check with your health care provider or pharmacist before using salt substitutes. Do not fry foods. Cook foods using healthy methods such as baking, boiling, grilling, roasting, and broiling instead. Cook with heart-healthy oils, such as olive, canola, avocado, soybean, or sunflower oil. Meal planning  Eat a balanced diet that includes: 4 or more servings of fruits and 4 or more servings of vegetables each day. Try to fill one-half of your plate with fruits and vegetables. 6-8 servings of whole grains each day. Less than 6 oz (170 g) of lean meat, poultry, or fish each day. A 3-oz (85-g) serving of meat is about the same size as a deck of cards. One egg equals 1 oz (28 g). 2-3 servings of low-fat dairy each day. One serving is 1 cup (237 mL). 1 serving of nuts, seeds, or beans 5 times each week. 2-3 servings of heart-healthy fats. Healthy fats called omega-3 fatty acids are found in foods such as walnuts,  flaxseeds, fortified milks, and eggs. These fats are also found in cold-water fish, such as sardines, salmon, and mackerel. Limit how much you eat of: Canned or prepackaged foods. Food that is high in trans fat, such as some fried foods. Food that is high in saturated fat, such as fatty meat. Desserts and other sweets, sugary drinks, and other foods with added sugar. Full-fat dairy products. Do not salt foods before eating. Do not eat more than 4 egg yolks a week. Try to eat at least 2 vegetarian meals a week. Eat more home-cooked food and less restaurant, buffet, and fast food. Lifestyle When eating at a restaurant, ask that your food be prepared with less salt or no salt, if possible. If you drink alcohol: Limit how much you use to: 0-1 drink a day for women who are not pregnant. 0-2 drinks a day for men. Be aware of how much alcohol is in your drink. In the [place] one drink equals one 12 oz bottle of beer (355 mL), one 5 oz glass of wine (148 mL), or one 1 oz glass of hard liquor (44 mL). General information Avoid eating more than 2,300 mg of salt a day. If you have hypertension, you may need to reduce your sodium intake to 1,500 mg a day. Work with your health care provider to maintain a healthy body weight or to lose weight. Ask what an ideal weight is for you. Get at least 30 minutes of exercise that causes your heart to beat faster (aerobic  exercise) most days of the week. Activities may include walking, swimming, or biking. Work with your health care provider or dietitian to adjust your eating plan to your individual calorie needs. What foods should I eat? Fruits All fresh, dried, or frozen fruit. Canned fruit in natural juice (without added sugar). Vegetables Fresh or frozen vegetables (raw, steamed, roasted, or grilled). Low-sodium or reduced-sodium tomato and vegetable juice. Low-sodium or reduced-sodium tomato sauce and tomato paste. Low-sodium or reduced-sodium canned  vegetables. Grains Whole-grain or whole-wheat bread. Whole-grain or whole-wheat pasta. Brown rice. Oatmeal. Quinoa. Bulgur. Whole-grain and low-sodium cereals. Pita bread. Low-fat, low-sodium crackers. Whole-wheat flour tortillas. Meats and other proteins Skinless chicken or turkey. Ground chicken or turkey. Pork with fat trimmed off. Fish and seafood. Egg whites. Dried beans, peas, or lentils. Unsalted nuts, nut butters, and seeds. Unsalted canned beans. Lean cuts of beef with fat trimmed off. Low-sodium, lean precooked or cured meat, such as sausages or meat loaves. Dairy Low-fat (1%) or fat-free (skim) milk. Reduced-fat, low-fat, or fat-free cheeses. Nonfat, low-sodium ricotta or cottage cheese. Low-fat or nonfat yogurt. Low-fat, low-sodium cheese. Fats and oils Soft margarine without trans fats. Vegetable oil. Reduced-fat, low-fat, or light mayonnaise and salad dressings (reduced-sodium). Canola, safflower, olive, avocado, soybean, and sunflower oils. Avocado. Seasonings and condiments Herbs. Spices. Seasoning mixes without salt. Other foods Unsalted popcorn and pretzels. Fat-free sweets. The items listed above may not be a complete list of foods and beverages you can eat. Contact a dietitian for more information. What foods should I avoid? Fruits Canned fruit in a light or heavy syrup. Fried fruit. Fruit in cream or butter sauce. Vegetables Creamed or fried vegetables. Vegetables in a cheese sauce. Regular canned vegetables (not low-sodium or reduced-sodium). Regular canned tomato sauce and paste (not low-sodium or reduced-sodium). Regular tomato and vegetable juice (not low-sodium or reduced-sodium). Pickles. Olives. Grains Baked goods made with fat, such as croissants, muffins, or some breads. Dry pasta or rice meal packs. Meats and other proteins Fatty cuts of meat. Ribs. Fried meat. Bacon. Bologna, salami, and other precooked or cured meats, such as sausages or meat loaves. Fat from  the back of a pig (fatback). Bratwurst. Salted nuts and seeds. Canned beans with added salt. Canned or smoked fish. Whole eggs or egg yolks. Chicken or turkey with skin. Dairy Whole or 2% milk, cream, and half-and-half. Whole or full-fat cream cheese. Whole-fat or sweetened yogurt. Full-fat cheese. Nondairy creamers. Whipped toppings. Processed cheese and cheese spreads. Fats and oils Butter. Stick margarine. Lard. Shortening. Ghee. Bacon fat. Tropical oils, such as coconut, palm kernel, or palm oil. Seasonings and condiments Onion salt, garlic salt, seasoned salt, table salt, and sea salt. Worcestershire sauce. Tartar sauce. Barbecue sauce. Teriyaki sauce. Soy sauce, including reduced-sodium. Steak sauce. Canned and packaged gravies. Fish sauce. Oyster sauce. Cocktail sauce. Store-bought horseradish. Ketchup. Mustard. Meat flavorings and tenderizers. Bouillon cubes. Hot sauces. Pre-made or packaged marinades. Pre-made or packaged taco seasonings. Relishes. Regular salad dressings. Other foods Salted popcorn and pretzels. The items listed above may not be a complete list of foods and beverages you should avoid. Contact a dietitian for more information. Where to find more information National Heart, Lung, and Blood Institute: www.nhlbi.nih.gov American Heart Association: www.heart.org Academy of Nutrition and Dietetics: www.eatright.org National Kidney Foundation: www.kidney.org Summary The DASH eating plan is a healthy eating plan that has been shown to reduce high blood pressure (hypertension). It may also reduce your risk for type 2 diabetes, heart disease, and stroke. When on the DASH   eating plan, aim to eat more fresh fruits and vegetables, whole grains, lean proteins, low-fat dairy, and heart-healthy fats. With the DASH eating plan, you should limit salt (sodium) intake to 2,300 mg a day. If you have hypertension, you may need to reduce your sodium intake to 1,500 mg a day. Work with your  health care provider or dietitian to adjust your eating plan to your individual calorie needs. This information is not intended to replace advice given to you by your health care provider. Make sure you discuss any questions you have with your health care provider. Document Revised: 03/17/2019 Document Reviewed: 03/17/2019 Elsevier Patient Education  [place].

## 2021-11-04 NOTE — Progress Notes (Signed)
Subjective:     Patient ID: Leroy Bell, male    DOB: Aug 29, 1960, 61 y.o.   MRN: [id number]  Chief Complaint  Patient presents with   Medication Refill    2nd day without medications, would like CVS B-1, Bystolic and Benicar refilled.     Medication Refill   Patient is in today for refill on medications. He usually sees Dr Leroy Bell.  States he needs his HTN medications and Thiamine refilled.   BP at home has improved since being on nebivolol 10 mg and olmesartan 20 mg.   BPs have improved. Checks it weekly. 125-140/78-80  Denies fever, chills, headaches, dizziness, chest pain, palpitations, shortness of breath, abdominal pain, N/V/D, urinary symptoms, LE edema.   Thiamine deficiency - taking CVS B-1 100 mg tablet daily.   Sees Dr. Shirline Bell every 3 months for pancytopenia. No bleeding concerns.   Sees cardiologist on Thursday. States heart murmur is not new.  Uses CPAP     Health Maintenance Due  Topic Date Due   Zoster Vaccines- Shingrix (1 of 2) Never done   OPHTHALMOLOGY EXAM  06/05/2021   HEMOGLOBIN A1C  07/30/2021    Past Medical History:  Diagnosis Date   Alcohol abuse    Coronary atherosclerosis due to calcified coronary lesion    Diabetes ([name])    Dyspnea    from anemia   Hyperlipidemia    Hypertension     Past Surgical History:  Procedure Laterality Date   NO PAST SURGERIES      Family History  Problem Relation Age of Onset   Diabetes Mother    Hypertension Mother    Diabetes Father    Hypertension Father    Heart attack Father    Prostate cancer Father    Diabetes Sister    Healthy Daughter    Healthy Son    Diabetes Paternal Grandmother    Diabetes Paternal Grandfather    Cancer Paternal Grandfather        unknown     Social History   Socioeconomic History   Marital status: Married    Spouse name: Leroy Bell   Number of children: 2   Years of education: Not on file   Highest education level: 12th grade  Occupational History    Not on file  Tobacco Use   Smoking status: Never   Smokeless tobacco: Current    Types: Snuff  Vaping Use   Vaping Use: Never used  Substance and Sexual Activity   Alcohol use: Yes    Alcohol/week: 24.0 standard drinks of alcohol    Types: 24 Cans of beer per week   Drug use: Not Currently   Sexual activity: Yes    Partners: Female    Birth control/protection: None  Other Topics Concern   Not on file  Social History Narrative   Lives at home with wife Leroy Bell   Right handed: no soda and only decaf coffee   Social Determinants of Corporate investment banker Strain: Not on file  Food Insecurity: Not on file  Transportation Needs: Not on file  Physical Activity: Not on file  Stress: Not on file  Social Connections: Not on file  Intimate Partner Violence: Not on file    Outpatient Medications Prior to Visit  Medication Sig Dispense Refill   atorvastatin (LIPITOR) 10 MG tablet TAKE 1 TABLET BY MOUTH EVERYDAY AT BEDTIME 90 tablet 0   Iron-FA-B Cmp-C-Biot-Probiotic (FUSION PLUS) CAPS      CVS B-1 100 MG  tablet TAKE 1 TABLET BY MOUTH EVERY DAY 90 tablet 0   nebivolol (BYSTOLIC) 10 MG tablet TAKE 1 TABLET BY MOUTH EVERY DAY 90 tablet 0   olmesartan (BENICAR) 20 MG tablet TAKE 1 TABLET BY MOUTH EVERY DAY 90 tablet 0   No facility-administered medications prior to visit.    Allergies  Allergen Reactions   Penicillins Hives    ROS Pertinent positives and negatives in the history of present illness.     Objective:    Physical Exam Constitutional:      General: He is not in acute distress.    Appearance: He is not ill-appearing.  HENT:     Mouth/Throat:     Mouth: Mucous membranes are moist.  Eyes:     Pupils: Pupils are equal, round, and reactive to light.  Cardiovascular:     Rate and Rhythm: Normal rate and regular rhythm.     Heart sounds: Murmur heard.  Pulmonary:     Effort: Pulmonary effort is normal.     Breath sounds: Normal breath sounds.   Musculoskeletal:     Right lower leg: No edema.     Left lower leg: No edema.  Skin:    General: Skin is warm and dry.  Neurological:     General: No focal deficit present.     Mental Status: He is alert and oriented to person, place, and time.     Motor: No weakness.     Gait: Gait normal.  Psychiatric:        Mood and Affect: Mood normal.        Behavior: Behavior normal.        Thought Content: Thought content normal.     BP (!) 146/74 (BP Location: Left Arm, Patient Position: Sitting, Cuff Size: Large)   Pulse 82   Temp 97.8 F (36.6 C) (Temporal)   Ht 5\' 7"  (1.702 m)   Wt 179 lb (81.2 kg)   SpO2 97%   BMI 28.04 kg/m  Wt Readings from Last 3 Encounters:  11/04/21 179 lb (81.2 kg)  08/26/21 181 lb 6 oz (82.3 kg)  05/08/21 182 lb 6.4 oz (82.7 kg)       Assessment & Plan:   Problem List Items Addressed This Visit       Cardiovascular and Mediastinum   Essential hypertension - Primary    Blood pressure has improved at home per patient.  Refilled medications.  Continue olmesartan 20 mg and nebivolol 10 mg daily.  DASH diet handout provided.  Follow-up with cardiology in 2 days as recommended.  Due for labs with cardiology and will get these today if possible or at his upcoming appointment.  Follow-up here with Dr. 07/06/21 in 3 months.      Relevant Medications   olmesartan (BENICAR) 20 MG tablet   nebivolol (BYSTOLIC) 10 MG tablet     Other   Anemia due to acquired thiamine deficiency    Followed by Dr. Yetta Bell.  Request refill of vitamin B1 today.  Refill provided.  Order for thiamine level placed.  Follow-up with Dr. Arbutus Ped in 3 months.      Relevant Medications   thiamine (CVS B-1) 100 MG tablet   Heart murmur    Per patient this is not new.  However, I do not see a history of heart murmur documented in his chart.  I will send a message to his cardiologist who he sees on 11/06/2021.  Asymptomatic.      Thiamine deficiency  Relevant Orders   Vitamin B1    Other Visit Diagnoses     Medication management       Relevant Orders   Vitamin B1       I have changed Leroy Bell's CVS B-1 to thiamine. I have also changed his olmesartan and nebivolol. I am also having him maintain his Fusion Plus and atorvastatin.  Meds ordered this encounter  Medications   olmesartan (BENICAR) 20 MG tablet    Sig: Take 1 tablet (20 mg total) by mouth daily.    Dispense:  90 tablet    Refill:  0    Order Specific Question:   Supervising Provider    Answer:   Pricilla Holm A [[id number]]   nebivolol (BYSTOLIC) 10 MG tablet    Sig: Take 1 tablet (10 mg total) by mouth daily.    Dispense:  90 tablet    Refill:  0    Order Specific Question:   Supervising Provider    Answer:   Pricilla Holm A [[id number]]   thiamine (CVS B-1) 100 MG tablet    Sig: Take 1 tablet (100 mg total) by mouth daily.    Dispense:  90 tablet    Refill:  0    Order Specific Question:   Supervising Provider    Answer:   Pricilla Holm A [id number]

## 2021-11-06 ENCOUNTER — Encounter: Payer: Self-pay | Admitting: Cardiology

## 2021-11-06 ENCOUNTER — Ambulatory Visit: Payer: Managed Care, Other (non HMO) | Admitting: Cardiology

## 2021-11-06 VITALS — BP 137/67 | HR 68 | Temp 98.0°F | Resp 17 | Ht 67.0 in | Wt 180.4 lb

## 2021-11-06 DIAGNOSIS — I10 Essential (primary) hypertension: Secondary | ICD-10-CM

## 2021-11-06 DIAGNOSIS — Z720 Tobacco use: Secondary | ICD-10-CM

## 2021-11-06 DIAGNOSIS — F1010 Alcohol abuse, uncomplicated: Secondary | ICD-10-CM

## 2021-11-06 DIAGNOSIS — I700 Atherosclerosis of aorta: Secondary | ICD-10-CM

## 2021-11-06 DIAGNOSIS — R011 Cardiac murmur, unspecified: Secondary | ICD-10-CM

## 2021-11-06 DIAGNOSIS — I2510 Atherosclerotic heart disease of native coronary artery without angina pectoris: Secondary | ICD-10-CM

## 2021-11-06 DIAGNOSIS — G4733 Obstructive sleep apnea (adult) (pediatric): Secondary | ICD-10-CM

## 2021-11-06 DIAGNOSIS — E785 Hyperlipidemia, unspecified: Secondary | ICD-10-CM

## 2021-11-06 NOTE — Progress Notes (Signed)
Date:  11/06/2021   ID:  Leroy Bell, DOB [birth year], MRN [id number]  PCP:  Janith Lima, MD  Cardiologist:  Rex Kras, DO, [facility] (established care 03/06/21)  Date: 11/06/21 Last Office Visit: 05/08/2021  Chief Complaint  Patient presents with   Follow-up    6 MONTH   CORONARY ARTERY CALCIFICATION   Hyperlipidemia    HPI  Leroy Bell is a 61 y.o.  male whose past medical history and cardiovascular risk factors include: OSA on CPAP, History of diabetes melitis now diet-controlled, hyperlipidemia, hypertension, alcohol use, aortic atherosclerosis, alcoholic cirrhosis without ascites.   Known history of severe coronary artery calcification with a total CAC of 461 placing him at the 92nd percentile.  He subsequently underwent stress test which noted normal myocardial perfusion and overall low risk study.  Echocardiogram noted preserved LVEF, normal diastolic function and no significant valvular heart disease.    In the interim he followed up with PCP and was noted to have a new cardiac murmur and referred to the practice for further evaluation.  Since last office visit patient denies any anginal discomfort or heart failure symptoms.  Unfortunately, he continues to drink 6-8 alcoholic beverages on the weekends daily.  He still does chewing tobacco 1 can/day.  Given his coronary artery calcification he has started taking statin therapy but has been reluctant with aspirin 81 mg p.o. daily.  Patient states that given his history of alcoholic cirrhosis without ascites he wanted to get cleared by GI prior to starting aspirin.  This is still pending.  He follows up with GI in 2 weeks.  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  ALLERGIES: Allergies  Allergen Reactions   Penicillins Hives    MEDICATION LIST PRIOR TO VISIT: Current Meds  Medication Sig   atorvastatin (LIPITOR)  10 MG tablet TAKE 1 TABLET BY MOUTH EVERYDAY AT BEDTIME   nebivolol (BYSTOLIC) 10 MG tablet Take 1 tablet (10 mg total) by mouth daily.   olmesartan (BENICAR) 20 MG tablet Take 1 tablet (20 mg total) by mouth daily.   thiamine ([employer] B-1) 100 MG tablet Take 1 tablet (100 mg total) by mouth daily.     PAST MEDICAL HISTORY: Past Medical History:  Diagnosis Date   Alcohol abuse    Coronary atherosclerosis due to calcified coronary lesion    Diabetes ([place])    Dyspnea    from anemia   Hyperlipidemia    Hypertension     PAST SURGICAL HISTORY: Past Surgical History:  Procedure Laterality Date   NO PAST SURGERIES      FAMILY HISTORY: The patient family history includes Cancer in his paternal grandfather; Diabetes in his father, mother, paternal grandfather, paternal grandmother, and sister; Healthy in his daughter and son; Heart attack in his father; Hypertension in his father and mother; Prostate cancer in his father.  SOCIAL HISTORY:  The patient  reports that he has never smoked. His smokeless tobacco use includes snuff. He reports current alcohol use of about 24.0 standard drinks of alcohol per week. He reports that he does not currently use drugs.  REVIEW OF SYSTEMS: Review of Systems  Constitutional: Negative for chills and fever.  HENT:  Negative for hoarse voice and nosebleeds.   Eyes:  Negative for discharge, double vision and pain.  Cardiovascular:  Negative for chest pain, claudication, dyspnea on exertion, leg swelling, near-syncope, orthopnea, palpitations, paroxysmal nocturnal dyspnea  and syncope.  Respiratory:  Negative for hemoptysis and shortness of breath.   Musculoskeletal:  Negative for muscle cramps and myalgias.  Gastrointestinal:  Negative for abdominal pain, constipation, diarrhea, hematemesis, hematochezia, melena, nausea and vomiting.  Neurological:  Negative for dizziness and light-headedness.    PHYSICAL EXAM:    11/06/2021   12:40 PM 11/04/2021    8:38  AM 08/26/2021    8:52 AM  Vitals with BMI  Height 5\' 7"  5\' 7"    Weight 180 lbs 6 oz 179 lbs 181 lbs 6 oz  BMI 28.25 28.03   Systolic 137 146  Diastolic 67 74 74  Pulse 68 82 67    CONSTITUTIONAL: Well-developed and well-nourished. No acute distress.  SKIN: Skin is warm and dry. No rash noted. No cyanosis. No pallor. No jaundice HEAD: Normocephalic and atraumatic.  EYES: No scleral icterus MOUTH/THROAT: Moist oral membranes.  NECK: No JVD present. No thyromegaly noted. left carotid bruits  LYMPHATIC: No visible cervical adenopathy.  CHEST Normal respiratory effort. No intercostal retractions  LUNGS: Clear to auscultation bilaterally.  No stridor. No wheezes. No rales.  CARDIOVASCULAR: Regular rate and rhythm, positive S1-S2, soft systolic murmur, no rubs or gallops appreciated. ABDOMINAL: Obese, soft, nontender, nondistended, positive bowel sounds all 4 quadrants. No apparent ascites.  EXTREMITIES: No peripheral edema, warm to touch, +2 DP and PT pulses HEMATOLOGIC: No significant bruising NEUROLOGIC: Oriented to person, place, and time. Nonfocal. Normal muscle tone.  PSYCHIATRIC: Normal mood and affect. Normal behavior. Cooperative  CARDIAC DATABASE: EKG: 11/06/2021: Normal sinus rhythm, 67 bpm, left axis, without underlying injury pattern.    Echocardiogram: 03/19/2021: Normal LV systolic function with visual EF 60-65%. Left ventricle cavity is normal in size. Normal left ventricular wall thickness. Normal global wall motion. Normal diastolic filling pattern, normal LAP.  Left atrial cavity is moderately dilated. Right atrial cavity is mildly dilated. Mild (Grade I) mitral regurgitation. No prior study for comparison.  Stress Testing: Lexiscan Tetrofosmin stress test 03/19/2021: 1 Day Rest/Stress Protocol. Stress EKG is non-diagnostic for ischemia as it's a pharmacologic stress test using Lexiscan. Normal myocardial perfusion without convincing evidence of prior infarct  or ischemia. Left ventricular cavity dilated, preserved wall thickness, calculated LVEF 72%. Low risk study. No previous exam available for comparison.  Heart Catheterization: None  Carotid artery duplex 03/19/2021:  Duplex suggests stenosis in the right internal carotid artery (1-15%).  Duplex suggests stenosis in the right common carotid artery (<50%), mild heterogeneous plaque. No evidence of significant stenosis in the left carotid vessels. Antegrade right vertebral artery flow. Antegrade left vertebral artery flow. Follow up study is appropriate if clinically indicated.  CT Cardiac Scoring: 03/28/2021: Left Main: 125 LAD: 233 LCx: 47 RCA: 56 Total Agatston Score: 461 [place] database percentile: 92 Aortic atherosclerosis. Hiatal hernia  LABORATORY DATA:    Latest Ref Rng & Units 08/26/2021    8:18 AM 04/02/2021   11:07 AM 01/20/2021   11:21 AM  CBC  WBC 4.0 - 10.5 K/uL 2.8  3.4  4.1   Hemoglobin 13.0 - 17.0 g/dL 14/10/2020  01/22/2021  8.1   Hematocrit 39.0 - 52.0 % 36.4  34.1  25.8   Platelets 150 - 400 K/uL 70  83  131        Latest Ref Rng & Units 01/29/2021    2:06 PM 09/09/2020    8:37 AM 07/01/2020   11:17 AM  CMP  Glucose 70 - 99 mg/dL 09/11/2020  08/31/2020  163   BUN 6 -  23 mg/dL 5  5  6    Creatinine 0.40 - 1.50 mg/dL  5.05  3.97   Sodium 135 - 145 mEq/L 138  136  129   Potassium 3.5 - 5.1 mEq/L 3.9  3.9  4.1   Chloride 96 - 112 mEq/L 105  106  100   CO2 19 - 32 mEq/L 25  23  23    Calcium 8.4 - 10.5 mg/dL 9.1  8.6  8.8   Total Protein 6.0 - 8.3 g/dL 6.6  7.1  7.3   Total Bilirubin 0.2 - 1.2 mg/dL 2.0  1.4  1.7   Alkaline Phos 39 - 117 U/L 187  241  198   AST 0 - 37 U/L 110  84  51   ALT 0 - 53 U/L 40  32  29     Lipid Panel  Lab Results  Component Value Date   CHOL 140 01/29/2021   HDL 49.40 01/29/2021   LDLCALC 75 01/29/2021   TRIG 77.0 01/29/2021   CHOLHDL 3 01/29/2021     No components found for: "NTPROBNP" Recent Labs    01/29/21 1406  PROBNP 46.0    Recent Labs    01/29/21 1406  TSH 3.28    BMP Recent Labs    01/29/21 1406  NA 138  K 3.9  CL 105  CO2 25  GLUCOSE 151*  BUN 5*  CREATININE 0.63  CALCIUM 9.1    HEMOGLOBIN A1C Lab Results  Component Value Date   HGBA1C 5.8 01/29/2021   MPG 116.89 07/29/2018    IMPRESSION:    ICD-10-CM   1. Coronary atherosclerosis due to calcified coronary lesion  I25.10    I25.84     2. Atherosclerosis of aorta ([name])  I70.0     3. Systolic murmur  03/31/2021 PCV ECHOCARDIOGRAM COMPLETE    4. Essential hypertension  I10 EKG 12-Lead    5. OSA on CPAP  G47.33    Z99.89     6. Hyperlipidemia LDL goal <70  E78.5     7. Alcohol abuse  F10.10     8. Smokeless tobacco use  Z72.0        RECOMMENDATIONS: Leroy Bell is a 61 y.o.  male whose past medical history and cardiac risk factors include: OSA on CPAP, History of diabetes melitis now diet-controlled, hyperlipidemia, hypertension, alcohol use, aortic atherosclerosis, alcoholic cirrhosis without ascites.   Coronary atherosclerosis due to calcified coronary lesion / Atherosclerosis of aorta [facility]): Total CAC 461, 92nd percentile Echo: LVEF 60 to 65%, normal diastolic function, additional details noted above. MPI: Low risk stress test additional details noted above. Denies anginal factors or heart failure symptoms. Currently on statin therapy. Patient remains reluctant to be on aspirin given his history of alcoholic cirrhosis -we emphasized the importance of reducing alcohol intake and patient would like to discuss with GI if it is okay to be on aspirin 81 mg p.o. daily. We emphasized the importance of complete cessation of nicotine products. Increase physical activity with a goal of 30 minutes a day 5 days a week of moderate intensity exercise.  Essential hypertension Office blood pressures are within acceptable range. Medications reconciled. No additional changes warranted at this time  OSA on CPAP Patient is  compliant with using his CPAP on a regular basis.  Hyperlipidemia LDL goal <70 Continue statin therapy. No recent lipid profiles available for review. Patient states that he will have it done with his annual well visit with PCP.  Alcohol abuse Currently drinks 6-8 alcoholic beverages in 1 sitting on the weekends.  Educated on the importance of reducing it to no more than 2 standard drinks per day.  Smokeless tobacco use Currently chews 1 can of tobacco daily.  Educated on importance of complete  cessation of nicotine products  At the recent visit with PCP there is a concern for possible new cardiac murmur.  He does have a soft systolic murmur predominantly at the second intercostal space.  Will order an echo to further evaluate for valvular heart disease.  If no significant change compared to prior study will continue with annual follow-ups after his yearly physical with PCP.  Otherwise sooner if change in clinical status.  Patient is in agreement with the plan of care.  FINAL MEDICATION LIST END OF ENCOUNTER: No orders of the defined types were placed in this encounter.    There are no discontinued medications.    Current Outpatient Medications:    atorvastatin (LIPITOR) 10 MG tablet, TAKE 1 TABLET BY MOUTH EVERYDAY AT BEDTIME, Disp: 90 tablet, Rfl: 0   nebivolol (BYSTOLIC) 10 MG tablet, Take 1 tablet (10 mg total) by mouth daily., Disp: 90 tablet, Rfl: 0   olmesartan (BENICAR) 20 MG tablet, Take 1 tablet (20 mg total) by mouth daily., Disp: 90 tablet, Rfl: 0   thiamine (CVS B-1) 100 MG tablet, Take 1 tablet (100 mg total) by mouth daily., Disp: 90 tablet, Rfl: 0   Iron-FA-B Cmp-C-Biot-Probiotic (FUSION PLUS) CAPS, , Disp: , Rfl:   Orders Placed This Encounter  Procedures   EKG 12-Lead   PCV ECHOCARDIOGRAM COMPLETE    There are no Patient Instructions on file for this visit.   --Continue cardiac medications as reconciled in final medication list. --Return in about 1 year  (around 11/07/2022) for Annual Follow up, Coronary artery calcification. Or sooner if needed. --Continue follow-up with your primary care physician regarding the management of your other chronic comorbid conditions.  Patient's questions and concerns were addressed to his satisfaction. He voices understanding of the instructions provided during this encounter.   This note was created using a voice recognition software as a result there may be grammatical errors inadvertently enclosed that do not reflect the nature of this encounter. Every attempt is made to correct such errors.  Rex Kras, [place], [facility]  Pager: [phone] Office: [phone]

## 2021-11-07 LAB — VITAMIN B1: Vitamin B1 (Thiamine): 45 nmol/L — ABNORMAL HIGH (ref 8–30)

## 2021-12-23 ENCOUNTER — Inpatient Hospital Stay: Payer: Managed Care, Other (non HMO)

## 2021-12-23 ENCOUNTER — Inpatient Hospital Stay: Payer: Managed Care, Other (non HMO) | Admitting: Internal Medicine

## 2021-12-25 ENCOUNTER — Other Ambulatory Visit: Payer: Managed Care, Other (non HMO)

## 2022-01-09 ENCOUNTER — Emergency Department (HOSPITAL_COMMUNITY)
Admission: EM | Admit: 2022-01-09 | Discharge: 2022-01-09 | Discharge status: Against Medical Advice | Payer: Managed Care, Other (non HMO) | Attending: Emergency Medicine | Admitting: Emergency Medicine

## 2022-01-09 ENCOUNTER — Encounter (HOSPITAL_COMMUNITY): Payer: Self-pay

## 2022-01-09 ENCOUNTER — Emergency Department (HOSPITAL_BASED_OUTPATIENT_CLINIC_OR_DEPARTMENT_OTHER): Payer: Managed Care, Other (non HMO)

## 2022-01-09 ENCOUNTER — Other Ambulatory Visit: Payer: Self-pay

## 2022-01-09 ENCOUNTER — Inpatient Hospital Stay (HOSPITAL_BASED_OUTPATIENT_CLINIC_OR_DEPARTMENT_OTHER)
Admission: EM | Admit: 2022-01-09 | Discharge: 2022-01-21 | DRG: 432 | Disposition: A | Discharge status: Rehabilitation Facility | Payer: Managed Care, Other (non HMO) | Attending: Internal Medicine | Admitting: Internal Medicine

## 2022-01-09 DIAGNOSIS — D62 Acute posthemorrhagic anemia: Secondary | ICD-10-CM | POA: Diagnosis present

## 2022-01-09 DIAGNOSIS — Z79899 Other long term (current) drug therapy: Secondary | ICD-10-CM

## 2022-01-09 DIAGNOSIS — R001 Bradycardia, unspecified: Secondary | ICD-10-CM | POA: Diagnosis not present

## 2022-01-09 DIAGNOSIS — E871 Hypo-osmolality and hyponatremia: Secondary | ICD-10-CM | POA: Diagnosis present

## 2022-01-09 DIAGNOSIS — K7031 Alcoholic cirrhosis of liver with ascites: Secondary | ICD-10-CM | POA: Diagnosis present

## 2022-01-09 DIAGNOSIS — E860 Dehydration: Secondary | ICD-10-CM | POA: Insufficient documentation

## 2022-01-09 DIAGNOSIS — Z833 Family history of diabetes mellitus: Secondary | ICD-10-CM

## 2022-01-09 DIAGNOSIS — R531 Weakness: Secondary | ICD-10-CM | POA: Insufficient documentation

## 2022-01-09 DIAGNOSIS — K922 Gastrointestinal hemorrhage, unspecified: Secondary | ICD-10-CM

## 2022-01-09 DIAGNOSIS — R578 Other shock: Secondary | ICD-10-CM | POA: Diagnosis present

## 2022-01-09 DIAGNOSIS — K3189 Other diseases of stomach and duodenum: Secondary | ICD-10-CM | POA: Diagnosis not present

## 2022-01-09 DIAGNOSIS — R112 Nausea with vomiting, unspecified: Secondary | ICD-10-CM | POA: Insufficient documentation

## 2022-01-09 DIAGNOSIS — I10 Essential (primary) hypertension: Secondary | ICD-10-CM | POA: Diagnosis present

## 2022-01-09 DIAGNOSIS — K7010 Alcoholic hepatitis without ascites: Secondary | ICD-10-CM | POA: Diagnosis present

## 2022-01-09 DIAGNOSIS — N179 Acute kidney failure, unspecified: Secondary | ICD-10-CM | POA: Diagnosis present

## 2022-01-09 DIAGNOSIS — I8511 Secondary esophageal varices with bleeding: Secondary | ICD-10-CM | POA: Diagnosis present

## 2022-01-09 DIAGNOSIS — F17290 Nicotine dependence, other tobacco product, uncomplicated: Secondary | ICD-10-CM | POA: Diagnosis not present

## 2022-01-09 DIAGNOSIS — E669 Obesity, unspecified: Secondary | ICD-10-CM | POA: Diagnosis present

## 2022-01-09 DIAGNOSIS — I272 Pulmonary hypertension, unspecified: Secondary | ICD-10-CM | POA: Diagnosis present

## 2022-01-09 DIAGNOSIS — R5381 Other malaise: Secondary | ICD-10-CM | POA: Diagnosis present

## 2022-01-09 DIAGNOSIS — K7030 Alcoholic cirrhosis of liver without ascites: Principal | ICD-10-CM | POA: Diagnosis present

## 2022-01-09 DIAGNOSIS — D696 Thrombocytopenia, unspecified: Secondary | ICD-10-CM | POA: Diagnosis present

## 2022-01-09 DIAGNOSIS — E119 Type 2 diabetes mellitus without complications: Secondary | ICD-10-CM

## 2022-01-09 DIAGNOSIS — E785 Hyperlipidemia, unspecified: Secondary | ICD-10-CM | POA: Diagnosis present

## 2022-01-09 DIAGNOSIS — F10231 Alcohol dependence with withdrawal delirium: Secondary | ICD-10-CM | POA: Diagnosis present

## 2022-01-09 DIAGNOSIS — R7989 Other specified abnormal findings of blood chemistry: Secondary | ICD-10-CM | POA: Diagnosis present

## 2022-01-09 DIAGNOSIS — F1020 Alcohol dependence, uncomplicated: Secondary | ICD-10-CM | POA: Diagnosis present

## 2022-01-09 DIAGNOSIS — D500 Iron deficiency anemia secondary to blood loss (chronic): Secondary | ICD-10-CM | POA: Diagnosis not present

## 2022-01-09 DIAGNOSIS — F10930 Alcohol use, unspecified with withdrawal, uncomplicated: Secondary | ICD-10-CM

## 2022-01-09 DIAGNOSIS — K7210 Chronic hepatic failure without coma: Secondary | ICD-10-CM | POA: Diagnosis present

## 2022-01-09 DIAGNOSIS — E1165 Type 2 diabetes mellitus with hyperglycemia: Secondary | ICD-10-CM | POA: Diagnosis present

## 2022-01-09 DIAGNOSIS — Z5321 Procedure and treatment not carried out due to patient leaving prior to being seen by health care provider: Secondary | ICD-10-CM | POA: Insufficient documentation

## 2022-01-09 DIAGNOSIS — K7290 Hepatic failure, unspecified without coma: Secondary | ICD-10-CM | POA: Diagnosis present

## 2022-01-09 DIAGNOSIS — I2510 Atherosclerotic heart disease of native coronary artery without angina pectoris: Secondary | ICD-10-CM | POA: Diagnosis not present

## 2022-01-09 DIAGNOSIS — I8510 Secondary esophageal varices without bleeding: Secondary | ICD-10-CM | POA: Diagnosis present

## 2022-01-09 DIAGNOSIS — Z8042 Family history of malignant neoplasm of prostate: Secondary | ICD-10-CM

## 2022-01-09 DIAGNOSIS — Z6830 Body mass index (BMI) 30.0-30.9, adult: Secondary | ICD-10-CM

## 2022-01-09 DIAGNOSIS — I8500 Esophageal varices without bleeding: Secondary | ICD-10-CM | POA: Diagnosis not present

## 2022-01-09 DIAGNOSIS — E861 Hypovolemia: Secondary | ICD-10-CM | POA: Diagnosis present

## 2022-01-09 DIAGNOSIS — K209 Esophagitis, unspecified without bleeding: Secondary | ICD-10-CM | POA: Diagnosis present

## 2022-01-09 DIAGNOSIS — Z8249 Family history of ischemic heart disease and other diseases of the circulatory system: Secondary | ICD-10-CM

## 2022-01-09 DIAGNOSIS — K7460 Unspecified cirrhosis of liver: Secondary | ICD-10-CM | POA: Diagnosis present

## 2022-01-09 DIAGNOSIS — R269 Unspecified abnormalities of gait and mobility: Secondary | ICD-10-CM | POA: Diagnosis present

## 2022-01-09 DIAGNOSIS — G4733 Obstructive sleep apnea (adult) (pediatric): Secondary | ICD-10-CM | POA: Diagnosis present

## 2022-01-09 DIAGNOSIS — F10931 Alcohol use, unspecified with withdrawal delirium: Secondary | ICD-10-CM | POA: Diagnosis not present

## 2022-01-09 DIAGNOSIS — D649 Anemia, unspecified: Secondary | ICD-10-CM

## 2022-01-09 DIAGNOSIS — R197 Diarrhea, unspecified: Secondary | ICD-10-CM | POA: Insufficient documentation

## 2022-01-09 DIAGNOSIS — Z20822 Contact with and (suspected) exposure to covid-19: Secondary | ICD-10-CM | POA: Diagnosis present

## 2022-01-09 DIAGNOSIS — Z880 Allergy status to penicillin: Secondary | ICD-10-CM

## 2022-01-09 DIAGNOSIS — I959 Hypotension, unspecified: Principal | ICD-10-CM

## 2022-01-09 DIAGNOSIS — F10939 Alcohol use, unspecified with withdrawal, unspecified: Secondary | ICD-10-CM | POA: Diagnosis present

## 2022-01-09 DIAGNOSIS — K766 Portal hypertension: Secondary | ICD-10-CM | POA: Diagnosis present

## 2022-01-09 DIAGNOSIS — E519 Thiamine deficiency, unspecified: Secondary | ICD-10-CM

## 2022-01-09 LAB — CBC WITH DIFFERENTIAL/PLATELET
Abs Immature Granulocytes: 0.11 10*3/uL — ABNORMAL HIGH (ref 0.00–0.07)
Abs Immature Granulocytes: 0.04 10*3/uL (ref 0.00–0.07)
Basophils Absolute: 0.1 10*3/uL (ref 0.0–0.1)
Basophils Absolute: 0 10*3/uL (ref 0.0–0.1)
Basophils Relative: 1 %
Basophils Relative: 0 %
Eosinophils Absolute: 0.1 10*3/uL (ref 0.0–0.5)
Eosinophils Absolute: 0.1 10*3/uL (ref 0.0–0.5)
Eosinophils Relative: 1 %
Eosinophils Relative: 3 %
HCT: 21.5 % — ABNORMAL LOW (ref 39.0–52.0)
HCT: 16.1 % — ABNORMAL LOW (ref 39.0–52.0)
Hemoglobin: 7 g/dL — ABNORMAL LOW (ref 13.0–17.0)
Hemoglobin: 5.5 g/dL — CL (ref 13.0–17.0)
Immature Granulocytes: 1 %
Immature Granulocytes: 1 %
Lymphocytes Relative: 11 %
Lymphocytes Relative: 13 %
Lymphs Abs: 1 10*3/uL (ref 0.7–4.0)
Lymphs Abs: 0.6 10*3/uL — ABNORMAL LOW (ref 0.7–4.0)
MCH: 36.3 pg — ABNORMAL HIGH (ref 26.0–34.0)
MCH: 35.7 pg — ABNORMAL HIGH (ref 26.0–34.0)
MCHC: 32.6 g/dL (ref 30.0–36.0)
MCHC: 34.2 g/dL (ref 30.0–36.0)
MCV: 111.4 fL — ABNORMAL HIGH (ref 80.0–100.0)
MCV: 104.5 fL — ABNORMAL HIGH (ref 80.0–100.0)
Monocytes Absolute: 1.4 10*3/uL — ABNORMAL HIGH (ref 0.1–1.0)
Monocytes Absolute: 0.8 10*3/uL (ref 0.1–1.0)
Monocytes Relative: 15 %
Monocytes Relative: 16 %
Neutro Abs: 6.6 10*3/uL (ref 1.7–7.7)
Neutro Abs: 3.4 10*3/uL (ref 1.7–7.7)
Neutrophils Relative %: 71 %
Neutrophils Relative %: 67 %
Platelets: 113 10*3/uL — ABNORMAL LOW (ref 150–400)
Platelets: 67 10*3/uL — ABNORMAL LOW (ref 150–400)
RBC: 1.93 MIL/uL — ABNORMAL LOW (ref 4.22–5.81)
RBC: 1.54 MIL/uL — ABNORMAL LOW (ref 4.22–5.81)
RDW: 14.6 % (ref 11.5–15.5)
RDW: 14.8 % (ref 11.5–15.5)
WBC: 9.4 10*3/uL (ref 4.0–10.5)
WBC: 5 10*3/uL (ref 4.0–10.5)
nRBC: 0.2 % (ref 0.0–0.2)
nRBC: 0 % (ref 0.0–0.2)

## 2022-01-09 LAB — SARS CORONAVIRUS 2 BY RT PCR: SARS Coronavirus 2 by RT PCR: NEGATIVE

## 2022-01-09 LAB — OCCULT BLOOD X 1 CARD TO LAB, STOOL: Fecal Occult Bld: POSITIVE — AB

## 2022-01-09 LAB — COMPREHENSIVE METABOLIC PANEL
ALT: 73 U/L — ABNORMAL HIGH (ref 0–44)
ALT: 57 U/L — ABNORMAL HIGH (ref 0–44)
AST: 133 U/L — ABNORMAL HIGH (ref 15–41)
AST: 101 U/L — ABNORMAL HIGH (ref 15–41)
Albumin: 2.9 g/dL — ABNORMAL LOW (ref 3.5–5.0)
Albumin: 2.7 g/dL — ABNORMAL LOW (ref 3.5–5.0)
Alkaline Phosphatase: 75 U/L (ref 38–126)
Alkaline Phosphatase: 61 U/L (ref 38–126)
Anion gap: 11 (ref 5–15)
Anion gap: 8 (ref 5–15)
BUN: 95 mg/dL — ABNORMAL HIGH (ref 6–20)
BUN: 102 mg/dL — ABNORMAL HIGH (ref 6–20)
CO2: 18 mmol/L — ABNORMAL LOW (ref 22–32)
CO2: 22 mmol/L (ref 22–32)
Calcium: 9.1 mg/dL (ref 8.9–10.3)
Calcium: 8.2 mg/dL — ABNORMAL LOW (ref 8.9–10.3)
Chloride: 102 mmol/L (ref 98–111)
Chloride: 99 mmol/L (ref 98–111)
Creatinine, Ser: 2.08 mg/dL — ABNORMAL HIGH (ref 0.61–1.24)
Creatinine, Ser: 1.94 mg/dL — ABNORMAL HIGH (ref 0.61–1.24)
GFR, Estimated: 36 mL/min — ABNORMAL LOW (ref >60)
GFR, Estimated: 39 mL/min — ABNORMAL LOW (ref >60)
Glucose, Bld: 195 mg/dL — ABNORMAL HIGH (ref 70–99)
Glucose, Bld: 196 mg/dL — ABNORMAL HIGH (ref 70–99)
Potassium: 5 mmol/L (ref 3.5–5.1)
Potassium: 4.4 mmol/L (ref 3.5–5.1)
Sodium: 131 mmol/L — ABNORMAL LOW (ref 135–145)
Sodium: 129 mmol/L — ABNORMAL LOW (ref 135–145)
Total Bilirubin: 5.4 mg/dL — ABNORMAL HIGH (ref 0.3–1.2)
Total Bilirubin: 4.5 mg/dL — ABNORMAL HIGH (ref 0.3–1.2)
Total Protein: 5.8 g/dL — ABNORMAL LOW (ref 6.5–8.1)
Total Protein: 4.9 g/dL — ABNORMAL LOW (ref 6.5–8.1)

## 2022-01-09 LAB — LACTIC ACID, PLASMA: Lactic Acid, Venous: 1.6 mmol/L (ref 0.5–1.9)

## 2022-01-09 LAB — LIPASE, BLOOD: Lipase: 59 U/L — ABNORMAL HIGH (ref 11–51)

## 2022-01-09 LAB — PROTIME-INR
INR: 1.9 — ABNORMAL HIGH (ref 0.8–1.2)
Prothrombin Time: 21.5 seconds — ABNORMAL HIGH (ref 11.4–15.2)

## 2022-01-09 LAB — CK: Total CK: 363 U/L (ref 49–397)

## 2022-01-09 LAB — PREPARE RBC (CROSSMATCH)

## 2022-01-09 LAB — AMMONIA: Ammonia: 52 umol/L — ABNORMAL HIGH (ref 9–35)

## 2022-01-09 LAB — MRSA NEXT GEN BY PCR, NASAL: MRSA by PCR Next Gen: NOT DETECTED

## 2022-01-09 LAB — ETHANOL: Alcohol, Ethyl (B): <10 mg/dL (ref <10)

## 2022-01-09 MED ORDER — SODIUM CHLORIDE 0.9 % IV BOLUS
1000.0000 mL | Freq: Once | INTRAVENOUS | Status: AC
  Administered 2022-01-09: 1000 mL via INTRAVENOUS

## 2022-01-09 MED ORDER — ORAL CARE MOUTH RINSE: 15.0000 mL | OROMUCOSAL | Status: DC | PRN

## 2022-01-09 MED ORDER — LORAZEPAM 2 MG/ML IJ SOLN
1.0000 mg | INTRAMUSCULAR | Status: AC | PRN
  Administered 2022-01-11: 2 mg via INTRAVENOUS
  Administered 2022-01-11: 1 mg via INTRAVENOUS
  Filled 2022-01-09 (×2): qty 1

## 2022-01-09 MED ORDER — SODIUM CHLORIDE 0.9 % IV SOLN
50.0000 ug/h | INTRAVENOUS | Status: DC
  Administered 2022-01-09 – 2022-01-14 (×11): 50 ug/h via INTRAVENOUS
  Filled 2022-01-09 (×15): qty 1

## 2022-01-09 MED ORDER — LORAZEPAM 1 MG PO TABS
1.0000 mg | ORAL_TABLET | ORAL | Status: AC | PRN
  Administered 2022-01-10: 1 mg via ORAL
  Administered 2022-01-10 (×3): 2 mg via ORAL
  Filled 2022-01-09 (×2): qty 2
  Filled 2022-01-09 (×2): qty 1
  Filled 2022-01-09: qty 2

## 2022-01-09 MED ORDER — LACTATED RINGERS IV BOLUS
1000.0000 mL | Freq: Once | INTRAVENOUS | Status: AC
  Administered 2022-01-09: 1000 mL via INTRAVENOUS

## 2022-01-09 MED ORDER — OCTREOTIDE LOAD VIA INFUSION
50.0000 ug | Freq: Once | INTRAVENOUS | Status: AC
  Administered 2022-01-09: 50 ug via INTRAVENOUS
  Filled 2022-01-09: qty 25

## 2022-01-09 MED ORDER — LACTATED RINGERS IV SOLN: INTRAVENOUS | Status: DC

## 2022-01-09 MED ORDER — FOLIC ACID 1 MG PO TABS
1.0000 mg | ORAL_TABLET | Freq: Every day | ORAL | Status: DC
  Administered 2022-01-10 – 2022-01-21 (×8): 1 mg via ORAL
  Filled 2022-01-09 (×8): qty 1

## 2022-01-09 MED ORDER — PANTOPRAZOLE 80MG IVPB - SIMPLE MED
80.0000 mg | Freq: Once | INTRAVENOUS | Status: AC
  Administered 2022-01-09: 80 mg via INTRAVENOUS
  Filled 2022-01-09: qty 100

## 2022-01-09 MED ORDER — SODIUM CHLORIDE 0.9% FLUSH
3.0000 mL | Freq: Two times a day (BID) | INTRAVENOUS | Status: DC
  Administered 2022-01-09 – 2022-01-21 (×24): 3 mL via INTRAVENOUS

## 2022-01-09 MED ORDER — SODIUM CHLORIDE 0.9 % IV BOLUS (SEPSIS)
1000.0000 mL | Freq: Once | INTRAVENOUS | Status: AC
  Administered 2022-01-09: 1000 mL via INTRAVENOUS

## 2022-01-09 MED ORDER — THIAMINE MONONITRATE 100 MG PO TABS
100.0000 mg | ORAL_TABLET | Freq: Every day | ORAL | Status: DC
  Administered 2022-01-10 – 2022-01-21 (×5): 100 mg via ORAL
  Filled 2022-01-09 (×5): qty 1

## 2022-01-09 MED ORDER — ONDANSETRON HCL 4 MG/2ML IJ SOLN: 4.0000 mg | Freq: Four times a day (QID) | INTRAMUSCULAR | Status: DC | PRN

## 2022-01-09 MED ORDER — ADULT MULTIVITAMIN W/MINERALS CH
1.0000 | ORAL_TABLET | Freq: Every day | ORAL | Status: DC
  Administered 2022-01-10 – 2022-01-21 (×8): 1 via ORAL
  Filled 2022-01-09 (×9): qty 1

## 2022-01-09 MED ORDER — ONDANSETRON HCL 4 MG PO TABS: 4.0000 mg | ORAL_TABLET | Freq: Four times a day (QID) | ORAL | Status: DC | PRN

## 2022-01-09 MED ORDER — PANTOPRAZOLE INFUSION (NEW) - SIMPLE MED
8.0000 mg/h | INTRAVENOUS | Status: AC
  Administered 2022-01-09 – 2022-01-12 (×8): 8 mg/h via INTRAVENOUS
  Filled 2022-01-09: qty 100
  Filled 2022-01-09: qty 80
  Filled 2022-01-09: qty 100
  Filled 2022-01-09 (×3): qty 80
  Filled 2022-01-09 (×4): qty 100
  Filled 2022-01-09: qty 80

## 2022-01-09 MED ORDER — CHLORHEXIDINE GLUCONATE CLOTH 2 % EX PADS
6.0000 | MEDICATED_PAD | Freq: Every day | CUTANEOUS | Status: DC
  Administered 2022-01-10 – 2022-01-15 (×6): 6 via TOPICAL

## 2022-01-09 MED ORDER — THIAMINE HCL 100 MG/ML IJ SOLN
100.0000 mg | Freq: Every day | INTRAMUSCULAR | Status: DC
  Administered 2022-01-11 – 2022-01-17 (×6): 100 mg via INTRAVENOUS
  Filled 2022-01-09 (×6): qty 2

## 2022-01-09 MED ORDER — SODIUM CHLORIDE 0.9 % IV SOLN
1.0000 g | INTRAVENOUS | Status: DC
  Administered 2022-01-10 – 2022-01-14 (×5): 1 g via INTRAVENOUS
  Filled 2022-01-09 (×6): qty 10

## 2022-01-09 MED ORDER — PANTOPRAZOLE SODIUM 40 MG IV SOLR
INTRAVENOUS | Status: AC
  Filled 2022-01-09: qty 20

## 2022-01-09 MED ORDER — SODIUM CHLORIDE 0.9% IV SOLUTION: Freq: Once | INTRAVENOUS | Status: AC

## 2022-01-09 MED ORDER — SODIUM CHLORIDE 0.9 % IV SOLN
1.0000 g | Freq: Once | INTRAVENOUS | Status: AC
  Administered 2022-01-09: 1 g via INTRAVENOUS
  Filled 2022-01-09: qty 10

## 2022-01-09 MED ORDER — LORAZEPAM 2 MG/ML IJ SOLN
1.0000 mg | Freq: Once | INTRAMUSCULAR | Status: AC
  Administered 2022-01-09: 1 mg via INTRAVENOUS
  Filled 2022-01-09: qty 1

## 2022-01-09 NOTE — ED Triage Notes (Signed)
Pt arrived POV with family members. Pt's family members report pt was just at [facility] and was in the waiting room for 4 hours so they left and came here. Pt c/o weakness and had N/V/D Monday-Thursday, none present. Pt denies CP, SOB.

## 2022-01-09 NOTE — Assessment & Plan Note (Signed)
--   CPAP nightly as tolerated 

## 2022-01-09 NOTE — Hospital Course (Addendum)
Mr. Alarid is a 61 y.o. M with alcohol dependence, cirrhosis, pHTN, HTN, IDA, OSA on CPAP who presented after being found down after a few days of N/V/D and dark stools.   In the ER, seemed hemodynamically stable, admitted for GI bleed and alcohol withdrawal.  Hgb 5.5 in the ER.    9/15: Admitted on PPI, octreotide, GI consulted, BP dropped overnight, transferred to stepdown 9/17: Developing delirium 9/18: Tbili rising to 14 9/19: Mentation improving 9/20: EGD showed esophagitis, small varices, GAVE; octreotide stopped; bradycardia with propranol; steroids started 9/21: Remains delirious at times; Rocephin stopped 9/23: Delirium improved, transferred OOU 9/25: Medically stabilized pending CIR

## 2022-01-09 NOTE — ED Provider Notes (Signed)
[facility] EMERGENCY DEPT Provider Note   CSN: [id number] Arrival date & time: 01/09/22  1752     History {Add pertinent medical, surgical, social history, OB history to HPI:1} No chief complaint on file.   Leroy Bell is a 61 y.o. male.  HPI      61 year old with a history of OSA, diabetes, hyperlipidemia, hypertension, alcohol use, aortic atherosclerosis, alcoholic cirrhosis without ascites, known coronary artery calcification  Monday night, Tuesday, Wed had nausea, vomiting, diarrhea.  Wife was out of town and returned home, tried calling him and he didn't answer. Daughter went to house and found him on the floor. He reports he has been dizzy, off balance, falling.  He reports last drink Sunday. Wife is not sure how much drinking he has been doing but knows he did not drink today. Worried he may be withdrawing.  He has been monitoring stools an did not see black or bloody stools with diarrhea. Wife saw that he ahd made a mess, diarrhea, around the house--it looked like it had been there for 2 days and was black but not sufe if that is why.  No abdominal pain, no fever.  No blood in vomit per pt, but wife thought it looked like dried on floor coffee grounds in bathroom.No n/v/d today.  No headaches.  Did not seem confused at first but asked son how long it took to get him an appointment here.  Not seeing or hearing things, doesn't think withdrawing.   He reports no injuries from falls. Had prior varices but no hx of variceal bleeding, did have some rectal bleeding and saw hematology for bloo dtransfusions but none in the last year.  No fever, cough, sore throat.     Home Medications Prior to Admission medications   Medication Sig Start Date End Date Taking? Authorizing Provider  atorvastatin (LIPITOR) 10 MG tablet TAKE 1 TABLET BY MOUTH EVERYDAY AT BEDTIME 10/16/21   Tolia, Sunit, DO  nebivolol (BYSTOLIC) 10 MG tablet Take 1 tablet (10 mg total) by mouth daily.  11/04/21   Henson, Vickie L, NP-C  olmesartan (BENICAR) 20 MG tablet Take 1 tablet (20 mg total) by mouth daily. 11/04/21   Henson, Vickie L, NP-C  thiamine (CVS B-1) 100 MG tablet Take 1 tablet (100 mg total) by mouth daily. 11/04/21   Henson, Zorita Pang, NP-C      Allergies    Penicillins    Review of Systems   Review of Systems  Physical Exam Updated Vital Signs There were no vitals taken for this visit. Physical Exam  ED Results / Procedures / Treatments   Labs (all labs ordered are listed, but only abnormal results are displayed) Labs Reviewed - No data to display  EKG None  Radiology No results found.  Procedures Procedures  {Document cardiac monitor, telemetry assessment procedure when appropriate:1}  Medications Ordered in ED Medications - No data to display  ED Course/ Medical Decision Making/ A&P                           Medical Decision Making Amount and/or Complexity of Data Reviewed Labs: ordered.   ***  61 year old with a history of OSA, diabetes, hyperlipidemia, hypertension, alcohol use, aortic atherosclerosis, alcoholic cirrhosis without ascites, known coronary artery calcification  Labs completed today show an acute kidney injury with a creatinine of 2.08 from 0.63 previously, BUN 95, similar mild elevation in transaminases, increased bilirubin to 5.4.  CBC shows  a hemoglobin of 7 from previous 4 months ago of 12.8.  He been noted to be as low as 7 in the past as an outpatient and was seeing hematology at that time.   {Document critical care time when appropriate:1} {Document review of labs and clinical decision tools ie heart score, Chads2Vasc2 etc:1}  {Document your independent review of radiology images, and any outside records:1} {Document your discussion with family members, caretakers, and with consultants:1} {Document social determinants of health affecting pt's care:1} {Document your decision making why or why not admission, treatments were  needed:1} Final Clinical Impression(s) / ED Diagnoses Final diagnoses:  None    Rx / DC Orders ED Discharge Orders     None

## 2022-01-09 NOTE — Assessment & Plan Note (Signed)
Last drink reportedly several days ago.  High risk for severe withdrawal. -Start CIWA protocol with Ativan as needed

## 2022-01-09 NOTE — Plan of Care (Signed)
Discussed with patient plan of care for the evening, pain management and admission questions with some teach back displayed.  As well mentioned he may need a blood transfusion tonight.  Problem: Education: Goal: Knowledge of General Education information will improve Description: Including pain rating scale, medication(s)/side effects and non-pharmacologic comfort measures Outcome: Progressing   Problem: Health Behavior/Discharge Planning: Goal: Ability to manage health-related needs will improve Outcome: Progressing

## 2022-01-09 NOTE — ED Notes (Signed)
Pt initially presented to [facility] ED via EMS but left from extended waits. Arrived to [facility] ED POV. C/o generalized weakness, n/v/d since Monday. Denied all on assessment. Tremors noted with CIWA of ~9. 1mg  ativan given as ordered. Pt denies any alcohol in several days but admits to 4-6 beers on a typical day.

## 2022-01-09 NOTE — Assessment & Plan Note (Addendum)
Creatinine 2.08 on admit compared to baseline 0.7-0.8, resolved with fluids.

## 2022-01-09 NOTE — ED Provider Triage Note (Signed)
Emergency Medicine Provider Triage Evaluation Note  ERMON SAGAN , a 61 y.o. male  was evaluated in triage.  Pt complains of weakness and dehydration.  He reported nausea, vomiting and diarrhea starting on Monday.  had these symptoms until yesterday.  Currently denies abdominal pain, nausea, vomiting, diarrhea however states he is much weaker than he normally is.  Review of Systems  Positive: As above Negative: As above, ethanol use, drug use  Physical Exam  BP (!) 98/36 (BP Location: Left Arm)   Pulse 74   Temp 98.2 F (36.8 C) (Oral)   Resp 16   Ht 5\' 7"  (1.702 m)   Wt 83.5 kg   SpO2 99%   BMI 28.82 kg/m  Gen:   Awake, no distress   Resp:  Normal effort  MSK:   Moves extremities without difficulty patient presents with tremor.  He states this is normal and has not increased. Other:  Slight scleral icterus  Medical Decision Making  Medically screening exam initiated at 1:32 PM.  Appropriate orders placed.  was informed that the remainder of the evaluation will be completed by another provider, this initial triage assessment does not replace that evaluation, and the importance of remaining in the ED until their evaluation is complete.  We will obtain basic labs and EKG.   Oliver Barre, Michelle Piper 01/09/22 1335

## 2022-01-09 NOTE — ED Triage Notes (Signed)
Per EMS- Patient c/o N/V/D and generalized weakness x 3 days.  EMS reported that the initial BP was 90/40. Patient received NS 400 ml IV prior to arrival to the ED. BP increased to 102/54.

## 2022-01-09 NOTE — Assessment & Plan Note (Addendum)
S/p 3u PRBCs transfusion 9/16 EGD delayed due to delirium, thankfully bleeding stopped. S/p EGD on 9/20 --> small varices, erosive esophagitis and portal gastropathy, any of which could have been source of bleeding.   Octreotide stopped post-EGD Hgb remains fairly stable - Continue PO PPI - Consult GI, appreciate cares

## 2022-01-09 NOTE — Assessment & Plan Note (Signed)
Plts stable

## 2022-01-09 NOTE — H&P (Signed)
History and Physical    Bazil Dhanani [facility] DGL:[id number] DOB: 09/29/1960 DOA: 01/09/2022  PCP: Janith Lima, MD  Patient coming from: Home  I have personally briefly reviewed patient's old medical records in [place]  Chief Complaint: Nausea, vomiting  HPI: Leroy Bell is a 61 y.o. male with medical history significant for chronic alcohol use, hepatic cirrhosis with portal venous hypertension, pancytopenia with iron deficiency anemia and chronic GI blood loss, HTN, HLD, OSA on CPAP who presented to the ED for evaluation of nausea vomiting.  History is limited from patient due to mental status and is otherwise supplemented by EDP and chart review.  Patient reports several days of nausea, vomiting, diarrhea.  His wife had been out of town and was unable to reach him by phone.  Daughter went to the house and found him down on the floor.  He has chronic daily alcohol use with reported last drink several days ago.  Per EDP documentation, family saw dark black stools around the house and coffee-ground appearing vomit in the bathroom.  Patient denies seeing any red bloody emesis or stools.  He denies any abdominal pain.  [facility] ED Course  Labs/Imaging on admission: I have personally reviewed following labs and imaging studies.  Initial vitals showed BP 103/72, pulse 64, RR 18, temp 98.3 F, SPO2 100% on room air.  Patient became hypotensive with BP as low as 87/69.  Labs showed hemoglobin 7.0 (12.8 on 08/26/2021), platelets 113,000, WBC 9.4, sodium 131, potassium 5.0, bicarb 18, BUN 95, creatinine 2.0) baseline 0.7-0.8), AST 133, ALT 73, alk phos 75, total bilirubin 5.4, lipase 59, CK3 163, ammonia 52, serum ethanol <10.  INR 1.9.  FOBT positive.  SARS-CoV-2 PCR negative.  Patient was given 2 L normal saline.  Repeat labs showed hemoglobin 5.5 and platelets 67,000 (question of dilution post fluid bolus?).  He was started on IV Protonix and octreotide infusions.  He was  given 1 g IV ceftriaxone and 1 mg IV Ativan while in the ED.  BP improved with fluid resuscitation.  The hospitalist service was consulted to admit for further evaluation and management.  Review of Systems: All systems reviewed and are negative except as documented in history of present illness above.   Past Medical History:  Diagnosis Date   Alcohol abuse    Coronary atherosclerosis due to calcified coronary lesion    Diabetes ([place])    Dyspnea    from anemia   Hyperlipidemia    Hypertension     Past Surgical History:  Procedure Laterality Date   NO PAST SURGERIES      Social History:  reports that he has never smoked. His smokeless tobacco use includes snuff. He reports current alcohol use of about 24.0 standard drinks of alcohol per week. He reports that he does not currently use drugs.  Allergies  Allergen Reactions   Penicillins Hives    Family History  Problem Relation Age of Onset   Diabetes Mother    Hypertension Mother    Diabetes Father    Hypertension Father    Heart attack Father    Prostate cancer Father    Diabetes Sister    Healthy Daughter    Healthy Son    Diabetes Paternal Grandmother    Diabetes Paternal Grandfather    Cancer Paternal Grandfather        unknown      Prior to Admission medications   Medication Sig Start Date End Date Taking? Authorizing  Provider  atorvastatin (LIPITOR) 10 MG tablet TAKE 1 TABLET BY MOUTH EVERYDAY AT BEDTIME 10/16/21   Tolia, Sunit, DO  nebivolol (BYSTOLIC) 10 MG tablet Take 1 tablet (10 mg total) by mouth daily. 11/04/21   Henson, Vickie L, NP-C  olmesartan (BENICAR) 20 MG tablet Take 1 tablet (20 mg total) by mouth daily. 11/04/21   Henson, Vickie L, NP-C  thiamine (CVS B-1) 100 MG tablet Take 1 tablet (100 mg total) by mouth daily. 11/04/21   Girtha Rm, NP-C    Physical Exam: Vitals:   01/09/22 2315 01/09/22 2330 01/09/22 2345 01/10/22 0000  BP: (!) 102/56 (!) 141/105 (!) 106/39 (!) 103/42  Pulse: 69   66 64  Resp: 18 19 (!) 21 15  Temp:      TempSrc:      SpO2: 100%  100% 100%  Weight:      Height:       Constitutional: Resting in bed with head elevated, somnolent, easily arouses but only briefly answers questions Eyes: PERRL, lids and conjunctivae normal ENMT: Mucous membranes are dry. Posterior pharynx clear of any exudate or lesions. Neck: normal, supple, no masses. Respiratory: clear to auscultation anteriorly.  Normal respiratory effort. No accessory muscle use.  Cardiovascular: Regular rate and rhythm, no murmurs / rubs / gallops. No extremity edema. 2+ pedal pulses. Abdomen: no tenderness, no masses palpated. Musculoskeletal: no clubbing / cyanosis. No joint deformity upper and lower extremities. Skin: no rashes, lesions, ulcers. No induration Neurologic: Sensation intact. Strength equal bilaterally. Psychiatric: Somnolent, arouses to voice but only briefly answers questions  EKG: Personally reviewed. Sinus rhythm, rate 72, LAFB.  Similar to prior.  Assessment/Plan Principal Problem:   Acute blood loss anemia Active Problems:   Acute renal failure (ARF) ([name])   Alcohol use with withdrawal ([name])   Hypotension   Thrombocytopenia ([name])   Elevated LFTs   OSA on CPAP   Alcoholic cirrhosis of liver without ascites ([name])   Leroy Bell is a 61 y.o. male with medical history significant for chronic alcohol use, hepatic cirrhosis with portal venous hypertension, pancytopenia with iron deficiency anemia and chronic GI blood loss, HTN, HLD, OSA on CPAP who is admitted with alcohol withdrawal complicated by anemia due to suspected upper GI bleed with associated acute renal failure.  Assessment and Plan: * Acute blood loss anemia Concern for upper GI bleed in setting of chronic alcohol use.  Initial hemoglobin 7.0 compared to previous 12.8 four months ago.  Repeat hemoglobin 5.5 post fluid boluses, may be dilutional.  No prior EGD in our system. -Continue IV Protonix  infusion -Continue IV octreotide infusion -IV ceftriaxone 1 g daily -Type and screen -Transfuse 1 unit PRBC -Secure chat sent to Dr. Watt Climes, [name] to see in a.m. -Keep n.p.o.  Acute renal failure (ARF) ([name]) Creatinine 2.08 on admit compared to baseline 0.7-0.8.  Likely due to hypovolemia and acute blood loss anemia.  Hepatorenal syndrome also on differential. -Continue IV fluid hydration overnight -Transfuse 1 unit per [facility] as above -If hypotensive again consider addition of midodrine and IV albumin  Hypotension Secondary to GI bleeding and volume depletion.  Give additional fluid bolus followed by maintenance fluids.  Transfuse 1 unit PRBC.  Hold antihypertensives.  Alcohol use with withdrawal ([place]) Last drink reportedly several days ago.  High risk for severe withdrawal. -Start CIWA protocol with Ativan as needed  Thrombocytopenia ([place]) Downtrending platelets, if <50,000 then proceed with transfusion.  OSA on CPAP CPAP nightly as tolerated.  DVT prophylaxis: SCDs Start: 01/09/22 2233 Code Status: Full code Family Communication: None present on admission Disposition Plan: From home, dispo pending clinical progress Consults called: [facility] to see in a.m. Severity of Illness: The appropriate patient status for this patient is INPATIENT. Inpatient status is judged to be reasonable and necessary in order to provide the required intensity of service to ensure the patient's safety. The patient's presenting symptoms, physical exam findings, and initial radiographic and laboratory data in the context of their chronic comorbidities is felt to place them at high risk for further clinical deterioration. Furthermore, it is not anticipated that the patient will be medically stable for discharge from the hospital within 2 midnights of admission.   * I certify that at the point of admission it is my clinical judgment that the patient will require inpatient hospital care spanning beyond 2  midnights from the point of admission due to high intensity of service, high risk for further deterioration and high frequency of surveillance required.Zada Finders MD [facility]  If 7PM-7AM, please contact night-coverage [email or web address]  01/10/2022, 12:08 AM

## 2022-01-09 NOTE — Assessment & Plan Note (Signed)
See above

## 2022-01-10 ENCOUNTER — Encounter (HOSPITAL_COMMUNITY): Payer: Self-pay | Admitting: Internal Medicine

## 2022-01-10 DIAGNOSIS — D62 Acute posthemorrhagic anemia: Secondary | ICD-10-CM | POA: Diagnosis not present

## 2022-01-10 LAB — CBC
HCT: 20.2 % — ABNORMAL LOW (ref 39.0–52.0)
Hemoglobin: 7 g/dL — ABNORMAL LOW (ref 13.0–17.0)
MCH: 36.3 pg — ABNORMAL HIGH (ref 26.0–34.0)
MCHC: 34.7 g/dL (ref 30.0–36.0)
MCV: 104.7 fL — ABNORMAL HIGH (ref 80.0–100.0)
Platelets: 65 10*3/uL — ABNORMAL LOW (ref 150–400)
RBC: 1.93 MIL/uL — ABNORMAL LOW (ref 4.22–5.81)
RDW: 16.2 % — ABNORMAL HIGH (ref 11.5–15.5)
WBC: 3.8 10*3/uL — ABNORMAL LOW (ref 4.0–10.5)
nRBC: 0 % (ref 0.0–0.2)

## 2022-01-10 LAB — IRON AND TIBC
Iron: 60 ug/dL (ref 45–182)
Saturation Ratios: 37 % (ref 17.9–39.5)
TIBC: 161 ug/dL — ABNORMAL LOW (ref 250–450)
UIBC: 101 ug/dL

## 2022-01-10 LAB — BASIC METABOLIC PANEL
Anion gap: 5 (ref 5–15)
BUN: 90 mg/dL — ABNORMAL HIGH (ref 6–20)
CO2: 22 mmol/L (ref 22–32)
Calcium: 7.9 mg/dL — ABNORMAL LOW (ref 8.9–10.3)
Chloride: 106 mmol/L (ref 98–111)
Creatinine, Ser: 1.66 mg/dL — ABNORMAL HIGH (ref 0.61–1.24)
GFR, Estimated: 47 mL/min — ABNORMAL LOW (ref >60)
Glucose, Bld: 156 mg/dL — ABNORMAL HIGH (ref 70–99)
Potassium: 4.5 mmol/L (ref 3.5–5.1)
Sodium: 133 mmol/L — ABNORMAL LOW (ref 135–145)

## 2022-01-10 LAB — CBC WITH DIFFERENTIAL/PLATELET
Abs Immature Granulocytes: 0.03 10*3/uL (ref 0.00–0.07)
Abs Immature Granulocytes: 0.04 10*3/uL (ref 0.00–0.07)
Basophils Absolute: 0 10*3/uL (ref 0.0–0.1)
Basophils Absolute: 0 10*3/uL (ref 0.0–0.1)
Basophils Relative: 0 %
Basophils Relative: 1 %
Eosinophils Absolute: 0.2 10*3/uL (ref 0.0–0.5)
Eosinophils Absolute: 0.2 10*3/uL (ref 0.0–0.5)
Eosinophils Relative: 5 %
Eosinophils Relative: 5 %
HCT: 19.8 % — ABNORMAL LOW (ref 39.0–52.0)
HCT: 29.1 % — ABNORMAL LOW (ref 39.0–52.0)
Hemoglobin: 6.8 g/dL — CL (ref 13.0–17.0)
Hemoglobin: 9.8 g/dL — ABNORMAL LOW (ref 13.0–17.0)
Immature Granulocytes: 1 %
Immature Granulocytes: 1 %
Lymphocytes Relative: 14 %
Lymphocytes Relative: 12 %
Lymphs Abs: 0.5 10*3/uL — ABNORMAL LOW (ref 0.7–4.0)
Lymphs Abs: 0.5 10*3/uL — ABNORMAL LOW (ref 0.7–4.0)
MCH: 35.8 pg — ABNORMAL HIGH (ref 26.0–34.0)
MCH: 33.9 pg (ref 26.0–34.0)
MCHC: 34.3 g/dL (ref 30.0–36.0)
MCHC: 33.7 g/dL (ref 30.0–36.0)
MCV: 104.2 fL — ABNORMAL HIGH (ref 80.0–100.0)
MCV: 100.7 fL — ABNORMAL HIGH (ref 80.0–100.0)
Monocytes Absolute: 0.6 10*3/uL (ref 0.1–1.0)
Monocytes Absolute: 0.7 10*3/uL (ref 0.1–1.0)
Monocytes Relative: 16 %
Monocytes Relative: 18 %
Neutro Abs: 2.3 10*3/uL (ref 1.7–7.7)
Neutro Abs: 2.6 10*3/uL (ref 1.7–7.7)
Neutrophils Relative %: 64 %
Neutrophils Relative %: 63 %
Platelets: 64 10*3/uL — ABNORMAL LOW (ref 150–400)
Platelets: 68 10*3/uL — ABNORMAL LOW (ref 150–400)
RBC: 1.9 MIL/uL — ABNORMAL LOW (ref 4.22–5.81)
RBC: 2.89 MIL/uL — ABNORMAL LOW (ref 4.22–5.81)
RDW: 16.9 % — ABNORMAL HIGH (ref 11.5–15.5)
RDW: 17.6 % — ABNORMAL HIGH (ref 11.5–15.5)
WBC: 3.6 10*3/uL — ABNORMAL LOW (ref 4.0–10.5)
WBC: 4 10*3/uL (ref 4.0–10.5)
nRBC: 0 % (ref 0.0–0.2)
nRBC: 0 % (ref 0.0–0.2)

## 2022-01-10 LAB — FOLATE: Folate: 10.5 ng/mL (ref >5.9)

## 2022-01-10 LAB — PREPARE RBC (CROSSMATCH)

## 2022-01-10 LAB — HIV ANTIBODY (ROUTINE TESTING W REFLEX): HIV Screen 4th Generation wRfx: NONREACTIVE

## 2022-01-10 LAB — MAGNESIUM: Magnesium: 2.4 mg/dL (ref 1.7–2.4)

## 2022-01-10 LAB — FERRITIN: Ferritin: 255 ng/mL (ref 24–336)

## 2022-01-10 LAB — VITAMIN B12: Vitamin B-12: 2812 pg/mL — ABNORMAL HIGH (ref 180–914)

## 2022-01-10 MED ORDER — VITAMIN K1 10 MG/ML IJ SOLN
5.0000 mg | Freq: Every day | INTRAVENOUS | Status: DC
  Administered 2022-01-10 – 2022-01-11 (×2): 5 mg via INTRAVENOUS
  Filled 2022-01-10 (×3): qty 0.5

## 2022-01-10 MED ORDER — SODIUM CHLORIDE 0.9% IV SOLUTION: Freq: Once | INTRAVENOUS | Status: AC

## 2022-01-10 MED ORDER — LACTATED RINGERS IV SOLN: INTRAVENOUS | Status: AC

## 2022-01-10 NOTE — Progress Notes (Signed)
Wife checked up on him and I updated her.  She has my number to call back tonight

## 2022-01-10 NOTE — H&P (View-Only) (Signed)
Reason for Consult: Cirrhosis recent GI bleeding Referring Physician: Hospital team  Leroy Bell is an 61 y.o. male.  HPI: Patient seen and examined in his hospital computer chart and our office computer chart reviewed and he has a long alcohol history and he really needs to quit which we discussed and his case discussed with his nurse as well and he has not had any bleeding since he has been here and is having some withdrawal but does not have any specific complaints but is thirsty and does have chronic anemia and pancytopenia and has seen hematology in both that work-up and previous GI work-up was reviewed and he probably had some coffee-ground emesis at home but does not remember any black stools and has no other specific complaints  Past Medical History:  Diagnosis Date   Alcohol abuse    Coronary atherosclerosis due to calcified coronary lesion    Diabetes ([name])    Dyspnea    from anemia   Hyperlipidemia    Hypertension     Past Surgical History:  Procedure Laterality Date   NO PAST SURGERIES      Family History  Problem Relation Age of Onset   Diabetes Mother    Hypertension Mother    Diabetes Father    Hypertension Father    Heart attack Father    Prostate cancer Father    Diabetes Sister    Healthy Daughter    Healthy Son    Diabetes Paternal Grandmother    Diabetes Paternal Grandfather    Cancer Paternal Grandfather        unknown     Social History:  reports that he has never smoked. His smokeless tobacco use includes snuff. He reports current alcohol use of about 24.0 standard drinks of alcohol per week. He reports that he does not currently use drugs.  Allergies:  Allergies  Allergen Reactions   Penicillins Hives    Medications: I have reviewed the patient's current medications.  Results for orders placed or performed during the hospital encounter of 01/09/22 (from the past 48 hour(s))  Occult blood card to lab, stool     Status: Abnormal    Collection Time: 01/09/22  6:30 PM  Result Value Ref Range   Fecal Occult Bld POSITIVE (A) NEGATIVE    Comment: Performed at [facility], [address] [place], [place] [address]  CK     Status: None   Collection Time: 01/09/22  6:36 PM  Result Value Ref Range   Total CK 363 49 - 397 U/L    Comment: Performed at [facility], [address], [place], [place] [address]  Ammonia     Status: Abnormal   Collection Time: 01/09/22  6:36 PM  Result Value Ref Range   Ammonia 52 (H) 9 - 35 umol/L    Comment: Performed at [facility], [address] [place], [place] [address]  Protime-INR     Status: Abnormal   Collection Time: 01/09/22  6:36 PM  Result Value Ref Range   Prothrombin Time 21.5 (H) 11.4 - 15.2 seconds   INR 1.9 (H) 0.8 - 1.2    Comment: (NOTE) INR goal varies based on device and disease states. Performed at [facility], [address] [place], [place] [address]   Lactic acid, plasma     Status: None   Collection Time: 01/09/22  6:36 PM  Result Value Ref Range   Lactic Acid, Venous 1.6 0.5 - 1.9 mmol/L  Comment: Performed at [facility], [address], [place], [place] [address]  Ethanol     Status: None   Collection Time: 01/09/22  6:36 PM  Result Value Ref Range   Alcohol, Ethyl (B) <10 <10 mg/dL    Comment: (NOTE) Lowest detectable limit for serum alcohol is 10 mg/dL.  For medical purposes only. Performed at [facility], [address], [place], [place] [address]   SARS Coronavirus 2 by RT PCR (hospital order, performed in [facility] lab) *cepheid single result test* Anterior Nasal Swab     Status: None   Collection Time: 01/09/22  7:18 PM   Specimen: Anterior Nasal Swab  Result Value Ref Range   SARS Coronavirus 2 by RT PCR NEGATIVE NEGATIVE    Comment: (NOTE) SARS-CoV-2 target nucleic acids are NOT DETECTED.  The  SARS-CoV-2 RNA is generally detectable in upper and lower respiratory specimens during the acute phase of infection. The lowest concentration of SARS-CoV-2 viral copies this assay can detect is 250 copies / mL. A negative result does not preclude SARS-CoV-2 infection and should not be used as the sole basis for treatment or other patient management decisions.  A negative result may occur with improper specimen collection / handling, submission of specimen other than nasopharyngeal swab, presence of viral mutation(s) within the areas targeted by this assay, and inadequate number of viral copies (<250 copies / mL). A negative result must be combined with clinical observations, patient history, and epidemiological information.  Fact Sheet for Patients:   RoadLapTop.co.zahttps://www.fda.gov/media/158405/download  Fact Sheet for Healthcare Providers: http://kim-miller.com/https://www.fda.gov/media/158404/download  This test is not yet approved or  cleared by the [place] FDA and has been authorized for detection and/or diagnosis of SARS-CoV-2 by FDA under an Emergency Use Authorization (EUA).  This EUA will remain in effect (meaning this test can be used) for the duration of the COVID-19 declaration under Section 564(b)(1) of the Act, 21 U.S.C. section 360bbb-3(b)(1), unless the authorization is terminated or revoked sooner.  Performed at [facility], [address], [place], [place] [address]   Comprehensive metabolic panel     Status: Abnormal   Collection Time: 01/09/22  8:22 PM  Result Value Ref Range   Sodium 129 (L) 135 - 145 mmol/L   Potassium 4.4 3.5 - 5.1 mmol/L   Chloride 99 98 - 111 mmol/L   CO2 22 22 - 32 mmol/L   Glucose, Bld 196 (H) 70 - 99 mg/dL    Comment: Glucose reference range applies only to samples taken after fasting for at least 8 hours.   BUN 102 (H) 6 - 20 mg/dL    Comment: RESULTS CONFIRMED BY MANUAL DILUTION   Creatinine, Ser 1.94 (H) 0.61 - 1.24 mg/dL   Calcium 8.2  (L) 8.9 - 10.3 mg/dL   Total Protein 4.9 (L) 6.5 - 8.1 g/dL   Albumin 2.7 (L) 3.5 - 5.0 g/dL   AST 347101 (H) 15 - 41 U/L   ALT 57 (H) 0 - 44 U/L   Alkaline Phosphatase 61 38 - 126 U/L   Total Bilirubin 4.5 (H) 0.3 - 1.2 mg/dL   GFR, Estimated 39 (L) >60 mL/min    Comment: (NOTE) Calculated using the CKD-EPI Creatinine Equation (2021)    Anion gap 8 5 - 15    Comment: Performed at [facility], [address], [place], [place] [address]  CBC WITH DIFFERENTIAL     Status: Abnormal   Collection Time: 01/09/22  8:22 PM  Result Value Ref Range   WBC 5.0 4.0 - 10.5 K/uL   RBC 1.54 (L) 4.22 - 5.81 MIL/uL   Hemoglobin 5.5 (LL) 13.0 - 17.0 g/dL    Comment: This critical result has verified and been called to [facility] by Alfredia Ferguson on 09 15 2023 at 2107, and has been read back.    HCT 16.1 (L) 39.0 - 52.0 %   MCV 104.5 (H) 80.0 - 100.0 fL   MCH 35.7 (H) 26.0 - 34.0 pg   MCHC 34.2 30.0 - 36.0 g/dL   RDW 14.8 11.5 - 15.5 %   Platelets 67 (L) 150 - 400 K/uL    Comment: SPECIMEN CHECKED FOR CLOTS Immature Platelet Fraction may be clinically indicated, consider ordering this additional test [id number] PLATELET COUNT CONFIRMED BY SMEAR    nRBC 0.0 0.0 - 0.2 %   Neutrophils Relative % 67 %   Neutro Abs 3.4 1.7 - 7.7 K/uL   Lymphocytes Relative 13 %   Lymphs Abs 0.6 (L) 0.7 - 4.0 K/uL   Monocytes Relative 16 %   Monocytes Absolute 0.8 0.1 - 1.0 K/uL   Eosinophils Relative 3 %   Eosinophils Absolute 0.1 0.0 - 0.5 K/uL   Basophils Relative 0 %   Basophils Absolute 0.0 0.0 - 0.1 K/uL   Immature Granulocytes 1 %   Abs Immature Granulocytes 0.04 0.00 - 0.07 K/uL    Comment: Performed at [facility], [address] [place], [place] [address]  MRSA Next Gen by PCR, Nasal     Status: None   Collection Time: 01/09/22 10:18 PM   Specimen: Nasal Mucosa; Nasal Swab  Result Value Ref Range   MRSA by PCR Next Gen NOT DETECTED NOT DETECTED     Comment: (NOTE) The GeneXpert MRSA Assay (FDA approved for NASAL specimens only), is one component of a comprehensive MRSA colonization surveillance program. It is not intended to diagnose MRSA infection nor to guide or monitor treatment for MRSA infections. Test performance is not FDA approved in patients less than 24 years old. Performed at [facility], [place] [address], [place], [place] [address]   Type and screen [place]     Status: None (Preliminary result)   Collection Time: 01/09/22 10:37 PM  Result Value Ref Range   ABO/RH(D) O POS    Antibody Screen NEG    Sample Expiration 01/12/2022,2359    Unit Number [id number]    Blood Component Type RBC LR PHER2    Unit division 00    Status of Unit ISSUED    Transfusion Status OK TO TRANSFUSE    Crossmatch Result      Compatible Performed at [facility], [place] [address] [place], [place] [address]   Prepare RBC (crossmatch)     Status: None   Collection Time: 01/09/22 10:37 PM  Result Value Ref Range   Order Confirmation      ORDER PROCESSED BY BLOOD BANK Performed at [facility], [place] [address], [place], [place] [address]   Basic metabolic panel     Status: Abnormal   Collection Time: 01/10/22  4:56 AM  Result Value Ref Range   Sodium 133 (L) 135 - 145 mmol/L   Potassium 4.5 3.5 - 5.1 mmol/L   Chloride 106 98 - 111 mmol/L   CO2 22 22 - 32 mmol/L   Glucose, Bld 156 (H) 70 - 99 mg/dL    Comment: Glucose reference range applies only  to samples taken after fasting for at least 8 hours.   BUN 90 (H) 6 - 20 mg/dL   Creatinine, Ser 4.65 (H) 0.61 - 1.24 mg/dL   Calcium 7.9 (L) 8.9 - 10.3 mg/dL   GFR, Estimated 47 (L) >60 mL/min    Comment: (NOTE) Calculated using the CKD-EPI Creatinine Equation (2021)    Anion gap 5 5 - 15    Comment: Performed at [facility], [address] [place], [place] [address]  CBC     Status:  Abnormal   Collection Time: 01/10/22  4:56 AM  Result Value Ref Range   WBC 3.8 (L) 4.0 - 10.5 K/uL   RBC 1.93 (L) 4.22 - 5.81 MIL/uL   Hemoglobin 7.0 (L) 13.0 - 17.0 g/dL   HCT 56.8 (L) 12.7 - 51.7 %   MCV 104.7 (H) 80.0 - 100.0 fL   MCH 36.3 (H) 26.0 - 34.0 pg   MCHC 34.7 30.0 - 36.0 g/dL   RDW 00.1 (H) 74.9 - 44.9 %   Platelets 65 (L) 150 - 400 K/uL    Comment: SPECIMEN CHECKED FOR CLOTS Immature Platelet Fraction may be clinically indicated, consider ordering this additional test [id number] REPEATED TO VERIFY PLATELET COUNT CONFIRMED BY SMEAR    nRBC 0.0 0.0 - 0.2 %    Comment: Performed at [facility], [address] [place], [place] [address]  Folate     Status: None   Collection Time: 01/10/22  4:56 AM  Result Value Ref Range   Folate 10.5 >5.9 ng/mL    Comment: Performed at [facility], [address] [place], [place] [address]  Magnesium     Status: None   Collection Time: 01/10/22  4:56 AM  Result Value Ref Range   Magnesium 2.4 1.7 - 2.4 mg/dL    Comment: Performed at [facility], [address] [place], [place] [address]    CT Head Wo Contrast  Result Date: 01/09/2022 CLINICAL DATA:  Altered mental status EXAM: CT HEAD WITHOUT CONTRAST TECHNIQUE: Contiguous axial images were obtained from the base of the skull through the vertex without intravenous contrast. RADIATION DOSE REDUCTION: This exam was performed according to the departmental dose-optimization program which includes automated exposure control, adjustment of the mA and/or kV according to patient size and/or use of iterative reconstruction technique. COMPARISON:  None Available. FINDINGS: Brain: No acute intracranial findings are seen. There are no signs of bleeding within the cranium. Cortical sulci are prominent. There is no focal edema or mass effect. Vascular: Unremarkable. Skull: Unremarkable. Sinuses/Orbits: There is mild mucosal thickening in  ethmoid and left maxillary sinuses. Other: None. IMPRESSION: No acute intracranial findings are seen in noncontrast CT brain. Atrophy. Mild chronic sinusitis. Electronically Signed   By: Ernie Avena M.D.   On: 01/09/2022 19:23    ROS negative except above Blood pressure (!) 110/47, pulse (!) 59, temperature 98.1 F (36.7 C), temperature source Axillary, resp. rate 15, height 5\' 7"  (1.702 m), weight 81.3 kg, SpO2 99 %. Physical Exam Tremulous but seems to answer questions appropriately vital signs stable afebrile abdomen is soft nontender no pedal edema labs reviewed significant increased BUN and creatinine slowly decreasing with hydration hemoglobin decreasing although came up with transfusion platelets greater than 50 INR a little elevated over baseline Assessment/Plan: Cirrhosis recent GI bleeding Plan: Agree with octreotide and Protonix for now will need an endoscopy when no longer withdrawing or with signs of active bleeding probably on Monday  but please call us sooner if needed otherwise we will check on tomorrow and will allow sips of clears today and possibly advance to a tray of clears for dinner and tomorrow we will discuss the timing of endoscopy  Benay Pomeroy E 01/10/2022, 8:06 AM

## 2022-01-10 NOTE — Progress Notes (Signed)
PROGRESS NOTE    JAQUAVEON BILAL  DTO:[id number] DOB: [birth year] DOA: 01/09/2022 PCP: Janith Lima, MD    Brief Narrative:  61 year old with history of chronic alcohol use, hepatic cirrhosis with portal venous hypertension, pancytopenia with iron deficiency anemia and chronic GI blood loss, hypertension hyperlipidemia obstructive sleep apnea on CPAP found at home with several days of nausea vomiting diarrhea, daughter went home and found him down on the floor tremulous, also noted to have black stool.  In the emergency room blood pressure stable.  On room air.  After arrival, blood pressure dropped to 87/69.  Hemoglobin 7 with recent hemoglobin of 12.8.  Bilirubin 5.4.  FOBT positive.  Fluid resuscitation is started and admitted to the hospital.   Assessment & Plan:   Acute on chronic GI bleeding, suspect variceal bleed in a patient with portal hypertension and known alcoholism with liver failure. Hemoglobin 5.5 after fluid resuscitation-2 units of PRBC-6.8-transfuse further 2 units today. Vitamin K 5 mg IV daily for 3 doses. Platelets are adequate, if any active bleeding we will transfuse platelets. Rocephin for prophylaxis. Protonix infusion and octreotide infusion for cirrhosis. Gastroenterology following, likely upper GI endoscopy Monday. Will allow clears as tolerated.  Acute kidney injury: Due to above.  Stabilizing.  Continue maintenance fluid and monitor levels.  Hemorrhagic shock: Due to above.  Blood pressure is adequate now.  Alcohol use with withdrawal: Very high risk of delirium tremens.  On multivitamins and benzodiazepines.  Close monitoring.  Alcoholism with alcohol use disorder/chronic liver failure: Poor prognosis.  Supportive care.  Ongoing counseling.  OSA on CPAP: Use CPAP at night.   DVT prophylaxis: SCDs Start: 01/09/22 2233   Code Status: Full code Family Communication: Wife and son at the bedside Disposition Plan: Status is: Inpatient Remains  inpatient appropriate because: Active upper GI bleeding, resuscitating     Consultants:  Gastroenterology  Procedures:  None  Antimicrobials:  Rocephin 9/15---   Subjective: Patient seen and examined.  Able to answer simple questions.  Looks tremulous and has jerky movements on the hands.  Wife and son at the bedside.  They agree that he has an alcohol problem.  No nausea or vomiting.  Trying to take sips of water.  Objective: Vitals:   01/10/22 1215 01/10/22 1226 01/10/22 1248 01/10/22 1300  BP: (!) 140/63 (!) 131/59 (!) 122/95 123/76  Pulse: 62 62 65 62  Resp: 20 20 20  (!) 23  Temp: 98.4 F (36.9 C) 98.4 F (36.9 C) 98.2 F (36.8 C)   TempSrc: Oral Oral Oral   SpO2: 97% 95% 96% 97%  Weight:      Height:        Intake/Output Summary (Last 24 hours) at 01/10/2022 1316 Last data filed at 01/10/2022 1213 Gross per 24 hour  Intake 6136.46 ml  Output 2350 ml  Net 3786.46 ml   Filed Weights   01/09/22 1813 01/09/22 2200 01/10/22 0034  Weight: 83.5 kg 81.3 kg 81.3 kg    Examination:  General: Sick looking, tremulous, icteric. Cardiovascular: S1-S2 normal.  Tachycardic. Respiratory: Lateral clear.  No added sounds. Gastrointestinal: Soft.  Nontender.  Bowel sound present. Ext: No swelling or edema.  No cyanosis. Neuro: Alert and awake.  Flat affect.  Can move all extremities.  He has some resting tremors.  He also has episodic clonus on all extremities. Musculoskeletal: No deformities.     Data Reviewed: I have personally reviewed following labs and imaging studies  CBC: Recent Labs  Lab 01/09/22 1418  01/09/22 2022 01/10/22 0456 01/10/22 0745  WBC 9.4 5.0 3.8* 3.6*  NEUTROABS 6.6 3.4  --  2.3  HGB 7.0* 5.5* 7.0* 6.8*  HCT 21.5* 16.1* 20.2* 19.8*  MCV 111.4* 104.5* 104.7* 104.2*  PLT 113* 67* 65* 64*   Basic Metabolic Panel: Recent Labs  Lab 01/09/22 1418 01/09/22 2022 01/10/22 0456  NA 131* 129* 133*  K 5.0 4.4 4.5  CL 102 99 106  CO2 18* 22  22  GLUCOSE 195* 196* 156*  BUN 95* 102* 90*  CREATININE 2.08* 1.94* 1.66*  CALCIUM 9.1 8.2* 7.9*  MG  --   --  2.4   GFR: Estimated Creatinine Clearance: 48.9 mL/min (A) (by C-G formula based on SCr of 1.66 mg/dL (H)). Liver Function Tests: Recent Labs  Lab 01/09/22 1418 01/09/22 2022  AST 133* 101*  ALT 73* 57*  ALKPHOS 75 61  BILITOT 5.4* 4.5*  PROT 5.8* 4.9*  ALBUMIN 2.9* 2.7*   Recent Labs  Lab 01/09/22 1418  LIPASE 59*   Recent Labs  Lab 01/09/22 1836  AMMONIA 52*   Coagulation Profile: Recent Labs  Lab 01/09/22 1836  INR 1.9*   Cardiac Enzymes: Recent Labs  Lab 01/09/22 1836  CKTOTAL 363   BNP (last 3 results) Recent Labs    01/29/21 1406  PROBNP 46.0   HbA1C: No results for input(s): "HGBA1C" in the last 72 hours. CBG: No results for input(s): "GLUCAP" in the last 168 hours. Lipid Profile: No results for input(s): "CHOL", "HDL", "LDLCALC", "TRIG", "CHOLHDL", "LDLDIRECT" in the last 72 hours. Thyroid Function Tests: No results for input(s): "TSH", "T4TOTAL", "FREET4", "T3FREE", "THYROIDAB" in the last 72 hours. Anemia Panel: Recent Labs    01/10/22 0456  VITAMINB12 2,812*  FOLATE 10.5  FERRITIN 255  TIBC 161*  IRON 60   Sepsis Labs: Recent Labs  Lab 01/09/22 1836  LATICACIDVEN 1.6    Recent Results (from the past 240 hour(s))  SARS Coronavirus 2 by RT PCR (hospital order, performed in [facility] hospital lab) *cepheid single result test* Anterior Nasal Swab     Status: None   Collection Time: 01/09/22  7:18 PM   Specimen: Anterior Nasal Swab  Result Value Ref Range Status   SARS Coronavirus 2 by RT PCR NEGATIVE NEGATIVE Final    Comment: (NOTE) SARS-CoV-2 target nucleic acids are NOT DETECTED.  The SARS-CoV-2 RNA is generally detectable in upper and lower respiratory specimens during the acute phase of infection. The lowest concentration of SARS-CoV-2 viral copies this assay can detect is 250 copies / mL. A negative result  does not preclude SARS-CoV-2 infection and should not be used as the sole basis for treatment or other patient management decisions.  A negative result may occur with improper specimen collection / handling, submission of specimen other than nasopharyngeal swab, presence of viral mutation(s) within the areas targeted by this assay, and inadequate number of viral copies (<250 copies / mL). A negative result must be combined with clinical observations, patient history, and epidemiological information.  Fact Sheet for Patients:   RoadLapTop.co.za  Fact Sheet for Healthcare Providers: http://kim-miller.com/  This test is not yet approved or  cleared by the [place] FDA and has been authorized for detection and/or diagnosis of SARS-CoV-2 by FDA under an Emergency Use Authorization (EUA).  This EUA will remain in effect (meaning this test can be used) for the duration of the COVID-19 declaration under Section 564(b)(1) of the Act, 21 U.S.C. section 360bbb-3(b)(1), unless the authorization is terminated  or revoked sooner.  Performed at [facility], [address] [place], [place] [address]   MRSA Next Gen by PCR, Nasal     Status: None   Collection Time: 01/09/22 10:18 PM   Specimen: Nasal Mucosa; Nasal Swab  Result Value Ref Range Status   MRSA by PCR Next Gen NOT DETECTED NOT DETECTED Final    Comment: (NOTE) The GeneXpert MRSA Assay (FDA approved for NASAL specimens only), is one component of a comprehensive MRSA colonization surveillance program. It is not intended to diagnose MRSA infection nor to guide or monitor treatment for MRSA infections. Test performance is not FDA approved in patients less than 36 years old. Performed at [facility], [address] [place], [place] [address]          Radiology Studies: CT Head Wo Contrast  Result Date: 01/09/2022 CLINICAL DATA:  Altered  mental status EXAM: CT HEAD WITHOUT CONTRAST TECHNIQUE: Contiguous axial images were obtained from the base of the skull through the vertex without intravenous contrast. RADIATION DOSE REDUCTION: This exam was performed according to the departmental dose-optimization program which includes automated exposure control, adjustment of the mA and/or kV according to patient size and/or use of iterative reconstruction technique. COMPARISON:  None Available. FINDINGS: Brain: No acute intracranial findings are seen. There are no signs of bleeding within the cranium. Cortical sulci are prominent. There is no focal edema or mass effect. Vascular: Unremarkable. Skull: Unremarkable. Sinuses/Orbits: There is mild mucosal thickening in ethmoid and left maxillary sinuses. Other: None. IMPRESSION: No acute intracranial findings are seen in noncontrast CT brain. Atrophy. Mild chronic sinusitis. Electronically Signed   By: Ernie Avena M.D.   On: 01/09/2022 19:23        Scheduled Meds:  Chlorhexidine Gluconate Cloth  6 each Topical Daily   folic acid  1 mg Oral Daily   multivitamin with minerals  1 tablet Oral Daily   sodium chloride flush  3 mL Intravenous Q12H   thiamine  100 mg Oral Daily   Or   thiamine  100 mg Intravenous Daily   Continuous Infusions:  cefTRIAXone (ROCEPHIN)  IV     lactated ringers 125 mL/hr at 01/10/22 1212   octreotide (SANDOSTATIN) 500 mcg in sodium chloride 0.9 % 250 mL (2 mcg/mL) infusion 50 mcg/hr (01/10/22 0559)   pantoprazole 8 mg/hr (01/10/22 0600)   phytonadione (VITAMIN K) 5 mg in dextrose 5 % 50 mL IVPB Stopped (01/10/22 1059)     LOS: 1 day    Time spent: 50 minutes    Dorcas Carrow, MD [facility] Pager [phone]

## 2022-01-10 NOTE — Consult Note (Signed)
Reason for Consult: Cirrhosis recent GI bleeding Referring Physician: Hospital team  Leroy Bell is an 61 y.o. male.  HPI: Patient seen and examined in his hospital computer chart and our office computer chart reviewed and he has a long alcohol history and he really needs to quit which we discussed and his case discussed with his nurse as well and he has not had any bleeding since he has been here and is having some withdrawal but does not have any specific complaints but is thirsty and does have chronic anemia and pancytopenia and has seen hematology in both that work-up and previous GI work-up was reviewed and he probably had some coffee-ground emesis at home but does not remember any black stools and has no other specific complaints  Past Medical History:  Diagnosis Date   Alcohol abuse    Coronary atherosclerosis due to calcified coronary lesion    Diabetes ([name])    Dyspnea    from anemia   Hyperlipidemia    Hypertension     Past Surgical History:  Procedure Laterality Date   NO PAST SURGERIES      Family History  Problem Relation Age of Onset   Diabetes Mother    Hypertension Mother    Diabetes Father    Hypertension Father    Heart attack Father    Prostate cancer Father    Diabetes Sister    Healthy Daughter    Healthy Son    Diabetes Paternal Grandmother    Diabetes Paternal Grandfather    Cancer Paternal Grandfather        unknown     Social History:  reports that he has never smoked. His smokeless tobacco use includes snuff. He reports current alcohol use of about 24.0 standard drinks of alcohol per week. He reports that he does not currently use drugs.  Allergies:  Allergies  Allergen Reactions   Penicillins Hives    Medications: I have reviewed the patient's current medications.  Results for orders placed or performed during the hospital encounter of 01/09/22 (from the past 48 hour(s))  Occult blood card to lab, stool     Status: Abnormal    Collection Time: 01/09/22  6:30 PM  Result Value Ref Range   Fecal Occult Bld POSITIVE (A) NEGATIVE    Comment: Performed at [facility], [address] [place], [place] [address]  CK     Status: None   Collection Time: 01/09/22  6:36 PM  Result Value Ref Range   Total CK 363 49 - 397 U/L    Comment: Performed at [facility], [address], [place], [place] [address]  Ammonia     Status: Abnormal   Collection Time: 01/09/22  6:36 PM  Result Value Ref Range   Ammonia 52 (H) 9 - 35 umol/L    Comment: Performed at [facility], [address] [place], [place] [address]  Protime-INR     Status: Abnormal   Collection Time: 01/09/22  6:36 PM  Result Value Ref Range   Prothrombin Time 21.5 (H) 11.4 - 15.2 seconds   INR 1.9 (H) 0.8 - 1.2    Comment: (NOTE) INR goal varies based on device and disease states. Performed at [facility], [address] [place], [place] [address]   Lactic acid, plasma     Status: None   Collection Time: 01/09/22  6:36 PM  Result Value Ref Range   Lactic Acid, Venous 1.6 0.5 - 1.9 mmol/L  Comment: Performed at [facility], [address], [place], [place] [address]  Ethanol     Status: None   Collection Time: 01/09/22  6:36 PM  Result Value Ref Range   Alcohol, Ethyl (B) <10 <10 mg/dL    Comment: (NOTE) Lowest detectable limit for serum alcohol is 10 mg/dL.  For medical purposes only. Performed at [facility], [address], [place], [place] [address]   SARS Coronavirus 2 by RT PCR (hospital order, performed in [facility] hospital lab) *cepheid single result test* Anterior Nasal Swab     Status: None   Collection Time: 01/09/22  7:18 PM   Specimen: Anterior Nasal Swab  Result Value Ref Range   SARS Coronavirus 2 by RT PCR NEGATIVE NEGATIVE    Comment: (NOTE) SARS-CoV-2 target nucleic acids are NOT DETECTED.  The  SARS-CoV-2 RNA is generally detectable in upper and lower respiratory specimens during the acute phase of infection. The lowest concentration of SARS-CoV-2 viral copies this assay can detect is 250 copies / mL. A negative result does not preclude SARS-CoV-2 infection and should not be used as the sole basis for treatment or other patient management decisions.  A negative result may occur with improper specimen collection / handling, submission of specimen other than nasopharyngeal swab, presence of viral mutation(s) within the areas targeted by this assay, and inadequate number of viral copies (<250 copies / mL). A negative result must be combined with clinical observations, patient history, and epidemiological information.  Fact Sheet for Patients:   RoadLapTop.co.zahttps://www.fda.gov/media/158405/download  Fact Sheet for Healthcare Providers: http://kim-miller.com/https://www.fda.gov/media/158404/download  This test is not yet approved or  cleared by the [place] FDA and has been authorized for detection and/or diagnosis of SARS-CoV-2 by FDA under an Emergency Use Authorization (EUA).  This EUA will remain in effect (meaning this test can be used) for the duration of the COVID-19 declaration under Section 564(b)(1) of the Act, 21 U.S.C. section 360bbb-3(b)(1), unless the authorization is terminated or revoked sooner.  Performed at [facility], [address], [place], [place] [address]   Comprehensive metabolic panel     Status: Abnormal   Collection Time: 01/09/22  8:22 PM  Result Value Ref Range   Sodium 129 (L) 135 - 145 mmol/L   Potassium 4.4 3.5 - 5.1 mmol/L   Chloride 99 98 - 111 mmol/L   CO2 22 22 - 32 mmol/L   Glucose, Bld 196 (H) 70 - 99 mg/dL    Comment: Glucose reference range applies only to samples taken after fasting for at least 8 hours.   BUN 102 (H) 6 - 20 mg/dL    Comment: RESULTS CONFIRMED BY MANUAL DILUTION   Creatinine, Ser 1.94 (H) 0.61 - 1.24 mg/dL   Calcium 8.2  (L) 8.9 - 10.3 mg/dL   Total Protein 4.9 (L) 6.5 - 8.1 g/dL   Albumin 2.7 (L) 3.5 - 5.0 g/dL   AST 347101 (H) 15 - 41 U/L   ALT 57 (H) 0 - 44 U/L   Alkaline Phosphatase 61 38 - 126 U/L   Total Bilirubin 4.5 (H) 0.3 - 1.2 mg/dL   GFR, Estimated 39 (L) >60 mL/min    Comment: (NOTE) Calculated using the CKD-EPI Creatinine Equation (2021)    Anion gap 8 5 - 15    Comment: Performed at [facility], [address], [place], [place] [address]  CBC WITH DIFFERENTIAL     Status: Abnormal   Collection Time: 01/09/22  8:22 PM  Result Value Ref Range   WBC 5.0 4.0 - 10.5 K/uL   RBC 1.54 (L) 4.22 - 5.81 MIL/uL   Hemoglobin 5.5 (LL) 13.0 - 17.0 g/dL    Comment: This critical result has verified and been called to [facility] by Alfredia Ferguson on 09 15 2023 at 2107, and has been read back.    HCT 16.1 (L) 39.0 - 52.0 %   MCV 104.5 (H) 80.0 - 100.0 fL   MCH 35.7 (H) 26.0 - 34.0 pg   MCHC 34.2 30.0 - 36.0 g/dL   RDW 14.8 11.5 - 15.5 %   Platelets 67 (L) 150 - 400 K/uL    Comment: SPECIMEN CHECKED FOR CLOTS Immature Platelet Fraction may be clinically indicated, consider ordering this additional test [id number] PLATELET COUNT CONFIRMED BY SMEAR    nRBC 0.0 0.0 - 0.2 %   Neutrophils Relative % 67 %   Neutro Abs 3.4 1.7 - 7.7 K/uL   Lymphocytes Relative 13 %   Lymphs Abs 0.6 (L) 0.7 - 4.0 K/uL   Monocytes Relative 16 %   Monocytes Absolute 0.8 0.1 - 1.0 K/uL   Eosinophils Relative 3 %   Eosinophils Absolute 0.1 0.0 - 0.5 K/uL   Basophils Relative 0 %   Basophils Absolute 0.0 0.0 - 0.1 K/uL   Immature Granulocytes 1 %   Abs Immature Granulocytes 0.04 0.00 - 0.07 K/uL    Comment: Performed at [facility], [address] [place], [place] [address]  MRSA Next Gen by PCR, Nasal     Status: None   Collection Time: 01/09/22 10:18 PM   Specimen: Nasal Mucosa; Nasal Swab  Result Value Ref Range   MRSA by PCR Next Gen NOT DETECTED NOT DETECTED     Comment: (NOTE) The GeneXpert MRSA Assay (FDA approved for NASAL specimens only), is one component of a comprehensive MRSA colonization surveillance program. It is not intended to diagnose MRSA infection nor to guide or monitor treatment for MRSA infections. Test performance is not FDA approved in patients less than 24 years old. Performed at [facility], [place] [address], [place], [place] [address]   Type and screen [place]     Status: None (Preliminary result)   Collection Time: 01/09/22 10:37 PM  Result Value Ref Range   ABO/RH(D) O POS    Antibody Screen NEG    Sample Expiration 01/12/2022,2359    Unit Number [id number]    Blood Component Type RBC LR PHER2    Unit division 00    Status of Unit ISSUED    Transfusion Status OK TO TRANSFUSE    Crossmatch Result      Compatible Performed at [facility], [place] [address] [place], [place] [address]   Prepare RBC (crossmatch)     Status: None   Collection Time: 01/09/22 10:37 PM  Result Value Ref Range   Order Confirmation      ORDER PROCESSED BY BLOOD BANK Performed at [facility], [place] [address], [place], [place] [address]   Basic metabolic panel     Status: Abnormal   Collection Time: 01/10/22  4:56 AM  Result Value Ref Range   Sodium 133 (L) 135 - 145 mmol/L   Potassium 4.5 3.5 - 5.1 mmol/L   Chloride 106 98 - 111 mmol/L   CO2 22 22 - 32 mmol/L   Glucose, Bld 156 (H) 70 - 99 mg/dL    Comment: Glucose reference range applies only  to samples taken after fasting for at least 8 hours.   BUN 90 (H) 6 - 20 mg/dL   Creatinine, Ser 4.65 (H) 0.61 - 1.24 mg/dL   Calcium 7.9 (L) 8.9 - 10.3 mg/dL   GFR, Estimated 47 (L) >60 mL/min    Comment: (NOTE) Calculated using the CKD-EPI Creatinine Equation (2021)    Anion gap 5 5 - 15    Comment: Performed at [facility], [address] [place], [place] [address]  CBC     Status:  Abnormal   Collection Time: 01/10/22  4:56 AM  Result Value Ref Range   WBC 3.8 (L) 4.0 - 10.5 K/uL   RBC 1.93 (L) 4.22 - 5.81 MIL/uL   Hemoglobin 7.0 (L) 13.0 - 17.0 g/dL   HCT 56.8 (L) 12.7 - 51.7 %   MCV 104.7 (H) 80.0 - 100.0 fL   MCH 36.3 (H) 26.0 - 34.0 pg   MCHC 34.7 30.0 - 36.0 g/dL   RDW 00.1 (H) 74.9 - 44.9 %   Platelets 65 (L) 150 - 400 K/uL    Comment: SPECIMEN CHECKED FOR CLOTS Immature Platelet Fraction may be clinically indicated, consider ordering this additional test [id number] REPEATED TO VERIFY PLATELET COUNT CONFIRMED BY SMEAR    nRBC 0.0 0.0 - 0.2 %    Comment: Performed at [facility], [address] [place], [place] [address]  Folate     Status: None   Collection Time: 01/10/22  4:56 AM  Result Value Ref Range   Folate 10.5 >5.9 ng/mL    Comment: Performed at [facility], [address] [place], [place] [address]  Magnesium     Status: None   Collection Time: 01/10/22  4:56 AM  Result Value Ref Range   Magnesium 2.4 1.7 - 2.4 mg/dL    Comment: Performed at [facility], [address] [place], [place] [address]    CT Head Wo Contrast  Result Date: 01/09/2022 CLINICAL DATA:  Altered mental status EXAM: CT HEAD WITHOUT CONTRAST TECHNIQUE: Contiguous axial images were obtained from the base of the skull through the vertex without intravenous contrast. RADIATION DOSE REDUCTION: This exam was performed according to the departmental dose-optimization program which includes automated exposure control, adjustment of the mA and/or kV according to patient size and/or use of iterative reconstruction technique. COMPARISON:  None Available. FINDINGS: Brain: No acute intracranial findings are seen. There are no signs of bleeding within the cranium. Cortical sulci are prominent. There is no focal edema or mass effect. Vascular: Unremarkable. Skull: Unremarkable. Sinuses/Orbits: There is mild mucosal thickening in  ethmoid and left maxillary sinuses. Other: None. IMPRESSION: No acute intracranial findings are seen in noncontrast CT brain. Atrophy. Mild chronic sinusitis. Electronically Signed   By: Ernie Avena M.D.   On: 01/09/2022 19:23    ROS negative except above Blood pressure (!) 110/47, pulse (!) 59, temperature 98.1 F (36.7 C), temperature source Axillary, resp. rate 15, height 5\' 7"  (1.702 m), weight 81.3 kg, SpO2 99 %. Physical Exam Tremulous but seems to answer questions appropriately vital signs stable afebrile abdomen is soft nontender no pedal edema labs reviewed significant increased BUN and creatinine slowly decreasing with hydration hemoglobin decreasing although came up with transfusion platelets greater than 50 INR a little elevated over baseline Assessment/Plan: Cirrhosis recent GI bleeding Plan: Agree with octreotide and Protonix for now will need an endoscopy when no longer withdrawing or with signs of active bleeding probably on Monday  but please call us sooner if needed otherwise we will check on tomorrow and will allow sips of clears today and possibly advance to a tray of clears for dinner and tomorrow we will discuss the timing of endoscopy  Airen Dales E 01/10/2022, 8:06 AM      

## 2022-01-10 NOTE — Plan of Care (Signed)
  Problem: Clinical Measurements: Goal: Respiratory complications will improve Outcome: Progressing Goal: Cardiovascular complication will be avoided Outcome: Progressing   Problem: Elimination: Goal: Will not experience complications related to urinary retention Outcome: Progressing   

## 2022-01-11 DIAGNOSIS — D62 Acute posthemorrhagic anemia: Secondary | ICD-10-CM | POA: Diagnosis not present

## 2022-01-11 LAB — CBC WITH DIFFERENTIAL/PLATELET
Abs Immature Granulocytes: 0.04 10*3/uL (ref 0.00–0.07)
Abs Immature Granulocytes: 0.06 10*3/uL (ref 0.00–0.07)
Basophils Absolute: 0 10*3/uL (ref 0.0–0.1)
Basophils Absolute: 0 10*3/uL (ref 0.0–0.1)
Basophils Relative: 1 %
Basophils Relative: 1 %
Eosinophils Absolute: 0.1 10*3/uL (ref 0.0–0.5)
Eosinophils Absolute: 0.2 10*3/uL (ref 0.0–0.5)
Eosinophils Relative: 3 %
Eosinophils Relative: 5 %
HCT: 27.2 % — ABNORMAL LOW (ref 39.0–52.0)
HCT: 32.5 % — ABNORMAL LOW (ref 39.0–52.0)
Hemoglobin: 10.6 g/dL — ABNORMAL LOW (ref 13.0–17.0)
Hemoglobin: 9.2 g/dL — ABNORMAL LOW (ref 13.0–17.0)
Immature Granulocytes: 1 %
Immature Granulocytes: 2 %
Lymphocytes Relative: 10 %
Lymphocytes Relative: 12 %
Lymphs Abs: 0.4 10*3/uL — ABNORMAL LOW (ref 0.7–4.0)
Lymphs Abs: 0.4 10*3/uL — ABNORMAL LOW (ref 0.7–4.0)
MCH: 34.3 pg — ABNORMAL HIGH (ref 26.0–34.0)
MCH: 34.3 pg — ABNORMAL HIGH (ref 26.0–34.0)
MCHC: 32.6 g/dL (ref 30.0–36.0)
MCHC: 33.8 g/dL (ref 30.0–36.0)
MCV: 101.5 fL — ABNORMAL HIGH (ref 80.0–100.0)
MCV: 105.2 fL — ABNORMAL HIGH (ref 80.0–100.0)
Monocytes Absolute: 0.7 10*3/uL (ref 0.1–1.0)
Monocytes Absolute: 0.7 10*3/uL (ref 0.1–1.0)
Monocytes Relative: 17 %
Monocytes Relative: 18 %
Neutro Abs: 2.3 10*3/uL (ref 1.7–7.7)
Neutro Abs: 2.9 10*3/uL (ref 1.7–7.7)
Neutrophils Relative %: 62 %
Neutrophils Relative %: 68 %
Platelets: 57 10*3/uL — ABNORMAL LOW (ref 150–400)
Platelets: 67 10*3/uL — ABNORMAL LOW (ref 150–400)
RBC: 2.68 MIL/uL — ABNORMAL LOW (ref 4.22–5.81)
RBC: 3.09 MIL/uL — ABNORMAL LOW (ref 4.22–5.81)
RDW: 18 % — ABNORMAL HIGH (ref 11.5–15.5)
RDW: 18.6 % — ABNORMAL HIGH (ref 11.5–15.5)
WBC: 3.7 10*3/uL — ABNORMAL LOW (ref 4.0–10.5)
WBC: 4.2 10*3/uL (ref 4.0–10.5)
nRBC: 0 % (ref 0.0–0.2)
nRBC: 0 % (ref 0.0–0.2)

## 2022-01-11 LAB — GLUCOSE, CAPILLARY
Glucose-Capillary: 142 mg/dL — ABNORMAL HIGH (ref 70–99)
Glucose-Capillary: 128 mg/dL — ABNORMAL HIGH (ref 70–99)

## 2022-01-11 LAB — COMPREHENSIVE METABOLIC PANEL
ALT: 63 U/L — ABNORMAL HIGH (ref 0–44)
AST: 108 U/L — ABNORMAL HIGH (ref 15–41)
Albumin: 2.4 g/dL — ABNORMAL LOW (ref 3.5–5.0)
Alkaline Phosphatase: 61 U/L (ref 38–126)
Anion gap: 3 — ABNORMAL LOW (ref 5–15)
BUN: 51 mg/dL — ABNORMAL HIGH (ref 6–20)
CO2: 23 mmol/L (ref 22–32)
Calcium: 7.8 mg/dL — ABNORMAL LOW (ref 8.9–10.3)
Chloride: 109 mmol/L (ref 98–111)
Creatinine, Ser: 1.4 mg/dL — ABNORMAL HIGH (ref 0.61–1.24)
GFR, Estimated: 58 mL/min — ABNORMAL LOW (ref >60)
Glucose, Bld: 169 mg/dL — ABNORMAL HIGH (ref 70–99)
Potassium: 4.3 mmol/L (ref 3.5–5.1)
Sodium: 135 mmol/L (ref 135–145)
Total Bilirubin: 5.5 mg/dL — ABNORMAL HIGH (ref 0.3–1.2)
Total Protein: 5.3 g/dL — ABNORMAL LOW (ref 6.5–8.1)

## 2022-01-11 MED ORDER — DEXTROSE-NACL 5-0.9 % IV SOLN: INTRAVENOUS | Status: DC

## 2022-01-11 NOTE — Progress Notes (Signed)
[place] 10:58 AM  Subjective: Patient seen and examined and case discussed with his nurse as well as his family and we answered all of their questions unfortunately he got a dose of Ativan and is difficult to arouse but has had no bowel movements and no bleeding  Objective: Vital signs stable afebrile no acute distress abdomen is not obviously tender BUN and creatinine improved hemoglobin stable  Assessment: Alcoholic cirrhosis with recent GI bleed  Plan: We will proceed with endoscopy as needed or early next week when no longer withdrawing and alcohol cessation transplant cirrhosis portal hypertension was all discussed with the family and I wished them well and we talked about some of the end-stage warning signs  [facility]  office [phone] After 5PM or if no answer call [phone]

## 2022-01-11 NOTE — Progress Notes (Signed)
PT remains on CPAP at this time. RN aware.

## 2022-01-11 NOTE — Progress Notes (Signed)
PROGRESS NOTE    Leroy Bell  DUK:[id number] DOB: Feb 14, 1961 DOA: 01/09/2022 PCP: Janith Lima, MD    Brief Narrative:  61 year old with history of chronic alcohol use, hepatic cirrhosis with portal venous hypertension, pancytopenia with iron deficiency anemia and chronic GI blood loss, hypertension hyperlipidemia obstructive sleep apnea on CPAP found at home with several days of nausea vomiting diarrhea, daughter went home and found him down on the floor tremulous, also noted to have black stool.  In the emergency room blood pressure stable.  On room air.  After arrival, blood pressure dropped to 87/69.  Hemoglobin 7 with recent hemoglobin of 12.8.  Bilirubin 5.4.  FOBT positive.  Fluid resuscitation started and admitted to the hospital.   Assessment & Plan:   Acute on chronic GI bleeding, suspect variceal bleed in a patient with portal hypertension and known alcoholism with liver failure. Hemoglobin 5.5  Given total of 4 units of PRBC, hemoglobin has remained stable and more than 9 after resuscitation.   Vitamin K 5 mg IV daily for 3 doses. Platelets are adequate, if any active bleeding we will transfuse platelets. Rocephin for prophylaxis. Protonix infusion and octreotide infusion for cirrhosis. Gastroenterology following, likely upper GI endoscopy after some stabilization. On clears. Alcohol withdrawal will complicate issues.  Acute kidney injury: Due to above.  Stabilizing.  Continue maintenance fluid and monitor levels.  Some improvement of levels today.  Hemorrhagic shock: Due to above.  Blood pressure is adequate now.  Alcohol use with withdrawal: Very high risk of delirium tremens.  On multivitamins and benzodiazepines.  Close monitoring.  Alcoholism with alcohol use disorder/chronic liver failure: Poor prognosis.  Supportive care.  Ongoing counseling.  OSA on CPAP: Use CPAP at night.   DVT prophylaxis: SCDs Start: 01/09/22 2233   Code Status: Full code Family  Communication: Wife and son at the bedside.  Wife's sister at the bedside. Disposition Plan: Status is: Inpatient Remains inpatient appropriate because: Active upper GI bleeding, active alcohol withdrawal.     Consultants:  Gastroenterology  Procedures:  None  Antimicrobials:  Rocephin 9/15---   Subjective: Patient seen in morning rounds.  Multiple family members arrive.  He was noted to be very tremulous and shaky in the morning and was given a dose of Ativan and has been sleeping since then.  He was deeply asleep, unable to wake up but remained quiet and calm.  On CPAP.  Objective: Vitals:   01/11/22 0550 01/11/22 0612 01/11/22 0650 01/11/22 0822  BP:  129/73  133/68  Pulse: 72 65 (!) 58 64  Resp:  (!) 21  (!) 22  Temp:      TempSrc:      SpO2:  95%  95%  Weight:      Height:        Intake/Output Summary (Last 24 hours) at 01/11/2022 1022 Last data filed at 01/11/2022 1003 Gross per 24 hour  Intake 5018.38 ml  Output 3415 ml  Net 1603.38 ml    Filed Weights   01/09/22 2200 01/10/22 0034 01/11/22 0500  Weight: 81.3 kg 81.3 kg 84.7 kg    Examination:  General: Sick looking, icteric.  Sleepy after Ativan doses. Cardiovascular: S1-S2 normal.  Tachycardic. Respiratory: Lateral clear.  No added sounds.  Noted upper airway sounds. Gastrointestinal: Soft.  Nontender.  Bowel sound present. Ext: No swelling or edema.  No cyanosis. Neuro: Flat affect.  Can move all extremities.   Musculoskeletal: No deformities.     Data Reviewed: I have  personally reviewed following labs and imaging studies  CBC: Recent Labs  Lab 01/09/22 1418 01/09/22 2022 01/10/22 0456 01/10/22 0745 01/10/22 1701 01/11/22 0026  WBC 9.4 5.0 3.8* 3.6* 4.0 3.7*  NEUTROABS 6.6 3.4  --  2.3 2.6 2.3  HGB 7.0* 5.5* 7.0* 6.8* 9.8* 9.2*  HCT 21.5* 16.1* 20.2* 19.8* 29.1* 27.2*  MCV 111.4* 104.5* 104.7* 104.2* 100.7* 101.5*  PLT 113* 67* 65* 64* 68* 67*    Basic Metabolic Panel: Recent Labs   Lab 01/09/22 1418 01/09/22 2022 01/10/22 0456 01/11/22 0026  NA 131* 129* 133* 135  K 5.0 4.4 4.5 4.3  CL 102 99 106 109  CO2 18* 22 22 23   GLUCOSE 195* 196* 156* 169*  BUN 95* 102* 90* 51*  CREATININE 2.08* 1.94* 1.66* 1.40*  CALCIUM 9.1 8.2* 7.9* 7.8*  MG  --   --  2.4  --     GFR: Estimated Creatinine Clearance: 59.1 mL/min (A) (by C-G formula based on SCr of 1.4 mg/dL (H)). Liver Function Tests: Recent Labs  Lab 01/09/22 1418 01/09/22 2022 01/11/22 0026  AST 133* 101* 108*  ALT 73* 57* 63*  ALKPHOS 75 61 61  BILITOT 5.4* 4.5* 5.5*  PROT 5.8* 4.9* 5.3*  ALBUMIN 2.9* 2.7* 2.4*    Recent Labs  Lab 01/09/22 1418  LIPASE 59*    Recent Labs  Lab 01/09/22 1836  AMMONIA 52*    Coagulation Profile: Recent Labs  Lab 01/09/22 1836  INR 1.9*    Cardiac Enzymes: Recent Labs  Lab 01/09/22 1836  CKTOTAL 363    BNP (last 3 results) Recent Labs    01/29/21 1406  PROBNP 46.0    HbA1C: No results for input(s): "HGBA1C" in the last 72 hours. CBG: No results for input(s): "GLUCAP" in the last 168 hours. Lipid Profile: No results for input(s): "CHOL", "HDL", "LDLCALC", "TRIG", "CHOLHDL", "LDLDIRECT" in the last 72 hours. Thyroid Function Tests: No results for input(s): "TSH", "T4TOTAL", "FREET4", "T3FREE", "THYROIDAB" in the last 72 hours. Anemia Panel: Recent Labs    01/10/22 0456  VITAMINB12 2,812*  FOLATE 10.5  FERRITIN 255  TIBC 161*  IRON 60    Sepsis Labs: Recent Labs  Lab 01/09/22 1836  LATICACIDVEN 1.6     Recent Results (from the past 240 hour(s))  SARS Coronavirus 2 by RT PCR (hospital order, performed in [facility] hospital lab) *cepheid single result test* Anterior Nasal Swab     Status: None   Collection Time: 01/09/22  7:18 PM   Specimen: Anterior Nasal Swab  Result Value Ref Range Status   SARS Coronavirus 2 by RT PCR NEGATIVE NEGATIVE Final    Comment: (NOTE) SARS-CoV-2 target nucleic acids are NOT DETECTED.  The  SARS-CoV-2 RNA is generally detectable in upper and lower respiratory specimens during the acute phase of infection. The lowest concentration of SARS-CoV-2 viral copies this assay can detect is 250 copies / mL. A negative result does not preclude SARS-CoV-2 infection and should not be used as the sole basis for treatment or other patient management decisions.  A negative result may occur with improper specimen collection / handling, submission of specimen other than nasopharyngeal swab, presence of viral mutation(s) within the areas targeted by this assay, and inadequate number of viral copies (<250 copies / mL). A negative result must be combined with clinical observations, patient history, and epidemiological information.  Fact Sheet for Patients:   01/11/22  Fact Sheet for Healthcare Providers: RoadLapTop.co.za  This test is not yet  approved or  cleared by the [place] and has been authorized for detection and/or diagnosis of SARS-CoV-2 by FDA under an Emergency Use Authorization (EUA).  This EUA will remain in effect (meaning this test can be used) for the duration of the COVID-19 declaration under Section 564(b)(1) of the Act, 21 U.S.C. section 360bbb-3(b)(1), unless the authorization is terminated or revoked sooner.  Performed at [facility], [address] [place], [place] [address]   MRSA Next Gen by PCR, Nasal     Status: None   Collection Time: 01/09/22 10:18 PM   Specimen: Nasal Mucosa; Nasal Swab  Result Value Ref Range Status   MRSA by PCR Next Gen NOT DETECTED NOT DETECTED Final    Comment: (NOTE) The GeneXpert MRSA Assay (FDA approved for NASAL specimens only), is one component of a comprehensive MRSA colonization surveillance program. It is not intended to diagnose MRSA infection nor to guide or monitor treatment for MRSA infections. Test performance is not FDA approved in  patients less than 31 years old. Performed at [facility], [address] [place], [place] [address]          Radiology Studies: CT Head Wo Contrast  Result Date: 01/09/2022 CLINICAL DATA:  Altered mental status EXAM: CT HEAD WITHOUT CONTRAST TECHNIQUE: Contiguous axial images were obtained from the base of the skull through the vertex without intravenous contrast. RADIATION DOSE REDUCTION: This exam was performed according to the departmental dose-optimization program which includes automated exposure control, adjustment of the mA and/or kV according to patient size and/or use of iterative reconstruction technique. COMPARISON:  None Available. FINDINGS: Brain: No acute intracranial findings are seen. There are no signs of bleeding within the cranium. Cortical sulci are prominent. There is no focal edema or mass effect. Vascular: Unremarkable. Skull: Unremarkable. Sinuses/Orbits: There is mild mucosal thickening in ethmoid and left maxillary sinuses. Other: None. IMPRESSION: No acute intracranial findings are seen in noncontrast CT brain. Atrophy. Mild chronic sinusitis. Electronically Signed   By: Ernie Avena M.D.   On: 01/09/2022 19:23        Scheduled Meds:  Chlorhexidine Gluconate Cloth  6 each Topical Daily   folic acid  1 mg Oral Daily   multivitamin with minerals  1 tablet Oral Daily   sodium chloride flush  3 mL Intravenous Q12H   thiamine  100 mg Oral Daily   Or   thiamine  100 mg Intravenous Daily   Continuous Infusions:  cefTRIAXone (ROCEPHIN)  IV Stopped (01/10/22 2120)   lactated ringers Stopped (01/11/22 1004)   octreotide (SANDOSTATIN) 500 mcg in sodium chloride 0.9 % 250 mL (2 mcg/mL) infusion 50 mcg/hr (01/11/22 0018)   pantoprazole 8 mg/hr (01/11/22 0021)   phytonadione (VITAMIN K) 5 mg in dextrose 5 % 50 mL IVPB Stopped (01/11/22 0836)     LOS: 2 days    Time spent: 40 minutes    Dorcas Carrow, MD [facility] Pager  [phone]

## 2022-01-11 NOTE — Progress Notes (Signed)
PT remains on CPAP- RN aware. RN states MD aware.

## 2022-01-12 ENCOUNTER — Inpatient Hospital Stay (HOSPITAL_COMMUNITY): Payer: Managed Care, Other (non HMO)

## 2022-01-12 DIAGNOSIS — D62 Acute posthemorrhagic anemia: Secondary | ICD-10-CM | POA: Diagnosis not present

## 2022-01-12 LAB — CBC WITH DIFFERENTIAL/PLATELET
Abs Immature Granulocytes: 0.06 10*3/uL (ref 0.00–0.07)
Basophils Absolute: 0 10*3/uL (ref 0.0–0.1)
Basophils Relative: 1 %
Eosinophils Absolute: 0.2 10*3/uL (ref 0.0–0.5)
Eosinophils Relative: 4 %
HCT: 31.4 % — ABNORMAL LOW (ref 39.0–52.0)
Hemoglobin: 10.3 g/dL — ABNORMAL LOW (ref 13.0–17.0)
Immature Granulocytes: 2 %
Lymphocytes Relative: 10 %
Lymphs Abs: 0.4 10*3/uL — ABNORMAL LOW (ref 0.7–4.0)
MCH: 34.4 pg — ABNORMAL HIGH (ref 26.0–34.0)
MCHC: 32.8 g/dL (ref 30.0–36.0)
MCV: 105 fL — ABNORMAL HIGH (ref 80.0–100.0)
Monocytes Absolute: 0.7 10*3/uL (ref 0.1–1.0)
Monocytes Relative: 16 %
Neutro Abs: 2.8 10*3/uL (ref 1.7–7.7)
Neutrophils Relative %: 67 %
Platelets: 65 10*3/uL — ABNORMAL LOW (ref 150–400)
RBC: 2.99 MIL/uL — ABNORMAL LOW (ref 4.22–5.81)
RDW: 18.2 % — ABNORMAL HIGH (ref 11.5–15.5)
WBC: 4.1 10*3/uL (ref 4.0–10.5)
nRBC: 0 % (ref 0.0–0.2)

## 2022-01-12 LAB — BPAM RBC
Blood Product Expiration Date: 202310222359
Blood Product Expiration Date: 202310222359
Blood Product Expiration Date: 202310222359
ISSUE DATE / TIME: 202309160050
ISSUE DATE / TIME: 202309160942
ISSUE DATE / TIME: 202309161224
Unit Type and Rh: 5100
Unit Type and Rh: 5100
Unit Type and Rh: 5100

## 2022-01-12 LAB — TYPE AND SCREEN
ABO/RH(D): O POS
Antibody Screen: NEGATIVE
Unit division: 0
Unit division: 0
Unit division: 0

## 2022-01-12 LAB — GLUCOSE, CAPILLARY
Glucose-Capillary: 107 mg/dL — ABNORMAL HIGH (ref 70–99)
Glucose-Capillary: 125 mg/dL — ABNORMAL HIGH (ref 70–99)
Glucose-Capillary: 137 mg/dL — ABNORMAL HIGH (ref 70–99)
Glucose-Capillary: 148 mg/dL — ABNORMAL HIGH (ref 70–99)
Glucose-Capillary: 151 mg/dL — ABNORMAL HIGH (ref 70–99)
Glucose-Capillary: 159 mg/dL — ABNORMAL HIGH (ref 70–99)
Glucose-Capillary: 184 mg/dL — ABNORMAL HIGH (ref 70–99)

## 2022-01-12 LAB — PROTIME-INR
INR: 2.1 — ABNORMAL HIGH (ref 0.8–1.2)
Prothrombin Time: 23.1 seconds — ABNORMAL HIGH (ref 11.4–15.2)

## 2022-01-12 LAB — MAGNESIUM: Magnesium: 2 mg/dL (ref 1.7–2.4)

## 2022-01-12 LAB — COMPREHENSIVE METABOLIC PANEL
ALT: 56 U/L — ABNORMAL HIGH (ref 0–44)
AST: 95 U/L — ABNORMAL HIGH (ref 15–41)
Albumin: 2.2 g/dL — ABNORMAL LOW (ref 3.5–5.0)
Alkaline Phosphatase: 58 U/L (ref 38–126)
Anion gap: 4 — ABNORMAL LOW (ref 5–15)
BUN: 29 mg/dL — ABNORMAL HIGH (ref 6–20)
CO2: 21 mmol/L — ABNORMAL LOW (ref 22–32)
Calcium: 7.7 mg/dL — ABNORMAL LOW (ref 8.9–10.3)
Chloride: 112 mmol/L — ABNORMAL HIGH (ref 98–111)
Creatinine, Ser: 1.17 mg/dL (ref 0.61–1.24)
GFR, Estimated: >60 mL/min (ref >60)
Glucose, Bld: 193 mg/dL — ABNORMAL HIGH (ref 70–99)
Potassium: 4.2 mmol/L (ref 3.5–5.1)
Sodium: 137 mmol/L (ref 135–145)
Total Bilirubin: 7.9 mg/dL — ABNORMAL HIGH (ref 0.3–1.2)
Total Protein: 5.1 g/dL — ABNORMAL LOW (ref 6.5–8.1)

## 2022-01-12 MED ORDER — NOREPINEPHRINE 4 MG/250ML-% IV SOLN
INTRAVENOUS | Status: AC
  Filled 2022-01-12: qty 250

## 2022-01-12 MED ORDER — LORAZEPAM 2 MG/ML IJ SOLN
1.0000 mg | Freq: Once | INTRAMUSCULAR | Status: AC
  Administered 2022-01-12: 1 mg via INTRAVENOUS
  Filled 2022-01-12: qty 1

## 2022-01-12 NOTE — Progress Notes (Signed)
PROGRESS NOTE    Leroy Bell  HUD:[id number] DOB: 01-03-1961 DOA: 01/09/2022 PCP: Etta Grandchild, MD    Brief Narrative:  61 year old with history of chronic alcohol use, hepatic cirrhosis with portal venous hypertension, pancytopenia with iron deficiency anemia and chronic GI blood loss, hypertension, hyperlipidemia, obstructive sleep apnea on CPAP found at home with several days of nausea vomiting and diarrhea, daughter went home and found him down on the floor tremulous, also noted to have black stool.  In the emergency room blood pressure stable.  On room air.  After arrival, blood pressure dropped to 87/69.  Hemoglobin 7 with recent hemoglobin of 12.8.  Bilirubin 5.4.  FOBT positive.  Fluid resuscitation started and admitted to the hospital. Remains in the hospital with severe alcohol withdrawal and lethargic.   Assessment & Plan:   Acute on chronic GI bleeding, suspect variceal bleed in a patient with portal hypertension and known alcoholism with liver failure. Hemoglobin 5.5 on presentation. Given total of 4 units of PRBC, hemoglobin has remained stable and more than 9 after resuscitation.   Vitamin K 5 mg 2 doses given. Platelets are adequate, currently no indication for transfusion. Rocephin for prophylaxis. Protonix infusion and octreotide infusion for cirrhosis.  Likely can taper off, GI following. Gastroenterology following, likely upper GI endoscopy after some stabilization. On clears.  Aspiration precautions. Alcohol withdrawal will complicate issues.  Acute kidney injury: Due to above.  Stabilizing.  Continue maintenance fluid and monitor levels.  Some improvement of levels today.  Hemorrhagic shock: Due to above.  Blood pressures are adequate now.  Alcohol use with withdrawal with delirium tremens.   Fall precautions.  Delirium precautions.  Seizure precautions.  On multivitamins and benzodiazepines.  Close monitoring. Continue aspiration  precautions.  Alcoholism with alcohol use disorder/chronic liver failure: Poor prognosis.  Supportive care.  Ongoing counseling.  GI following.  OSA on CPAP: Use CPAP at night.   DVT prophylaxis: SCDs Start: 01/09/22 2233   Code Status: Full code Family Communication: Son at the bedside. Disposition Plan: Status is: Inpatient Remains inpatient appropriate because: Active upper GI bleeding, active alcohol withdrawal.     Consultants:  Gastroenterology  Procedures:  None  Antimicrobials:  Rocephin 9/15---   Subjective: Patient seen in the morning rounds.  Overnight only used 1 dose of Ativan, slept well.  He was not trying to be more communicative today.  He tells me that "wake-up, sleep and sleep again".  He followed simple commands. Went to see him again with cough, apparently he was trying to drink some soup and had a choking episode.  We will take extreme aspiration precautions.  Chest x-ray ordered. Deeply icteric.  Bilirubin is more than 7.  Objective: Vitals:   01/12/22 0400 01/12/22 0448 01/12/22 0600 01/12/22 0727  BP: (!) 122/53  (!) 148/71   Pulse: 66 65 61   Resp: 15 20 (!) 23   Temp: 99 F (37.2 C)   98.2 F (36.8 C)  TempSrc: Axillary   Axillary  SpO2: 95% 95% 94%   Weight: 84.7 kg     Height:        Intake/Output Summary (Last 24 hours) at 01/12/2022 1038 Last data filed at 01/12/2022 0841 Gross per 24 hour  Intake 2278.83 ml  Output 1190 ml  Net 1088.83 ml    Filed Weights   01/10/22 0034 01/11/22 0500 01/12/22 0400  Weight: 81.3 kg 84.7 kg 84.7 kg    Examination:  General: Very sick looking, icteric.  Mostly  sleepy.  Today he wakes up and answers few questions appropriately. Deeply icteric. Cardiovascular: S1-S2 normal.  Tachycardic. Respiratory: Conducted upper airway sounds.  On 2 L oxygen. Gastrointestinal: Soft.  Nontender.  Bowel sound present. Ext: No swelling or edema.  No cyanosis. Neuro: Flat affect.  Can move all extremities.   Anxious and tremulous. Musculoskeletal: No deformities.     Data Reviewed: I have personally reviewed following labs and imaging studies  CBC: Recent Labs  Lab 01/10/22 0745 01/10/22 1701 01/11/22 0026 01/11/22 1458 01/12/22 0259  WBC 3.6* 4.0 3.7* 4.2 4.1  NEUTROABS 2.3 2.6 2.3 2.9 2.8  HGB 6.8* 9.8* 9.2* 10.6* 10.3*  HCT 19.8* 29.1* 27.2* 32.5* 31.4*  MCV 104.2* 100.7* 101.5* 105.2* 105.0*  PLT 64* 68* 67* 57* 65*    Basic Metabolic Panel: Recent Labs  Lab 01/09/22 1418 01/09/22 2022 01/10/22 0456 01/11/22 0026 01/12/22 0259  NA 131* 129* 133* 135 137  K 5.0 4.4 4.5 4.3 4.2  CL 102 99 106 109 112*  CO2 18* 22 22 23  21*  GLUCOSE 195* 196* 156* 169* 193*  BUN 95* 102* 90* 51* 29*  CREATININE 2.08* 1.94* 1.66* 1.40* 1.17  CALCIUM 9.1 8.2* 7.9* 7.8* 7.7*  MG  --   --  2.4  --  2.0    GFR: Estimated Creatinine Clearance: 70.7 mL/min (by C-G formula based on SCr of 1.17 mg/dL). Liver Function Tests: Recent Labs  Lab 01/09/22 1418 01/09/22 2022 01/11/22 0026 01/12/22 0259  AST 133* 101* 108* 95*  ALT 73* 57* 63* 56*  ALKPHOS 75 61 61 58  BILITOT 5.4* 4.5* 5.5* 7.9*  PROT 5.8* 4.9* 5.3* 5.1*  ALBUMIN 2.9* 2.7* 2.4* 2.2*    Recent Labs  Lab 01/09/22 1418  LIPASE 59*    Recent Labs  Lab 01/09/22 1836  AMMONIA 52*    Coagulation Profile: Recent Labs  Lab 01/09/22 1836  INR 1.9*    Cardiac Enzymes: Recent Labs  Lab 01/09/22 1836  CKTOTAL 363    BNP (last 3 results) Recent Labs    01/29/21 1406  PROBNP 46.0    HbA1C: No results for input(s): "HGBA1C" in the last 72 hours. CBG: Recent Labs  Lab 01/11/22 1829 01/11/22 1939 01/11/22 2335 01/12/22 0340 01/12/22 0727  GLUCAP 142* 128* 159* 184* 151*   Lipid Profile: No results for input(s): "CHOL", "HDL", "LDLCALC", "TRIG", "CHOLHDL", "LDLDIRECT" in the last 72 hours. Thyroid Function Tests: No results for input(s): "TSH", "T4TOTAL", "FREET4", "T3FREE", "THYROIDAB" in the  last 72 hours. Anemia Panel: Recent Labs    01/10/22 0456  VITAMINB12 2,812*  FOLATE 10.5  FERRITIN 255  TIBC 161*  IRON 60    Sepsis Labs: Recent Labs  Lab 01/09/22 1836  LATICACIDVEN 1.6     Recent Results (from the past 240 hour(s))  SARS Coronavirus 2 by RT PCR (hospital order, performed in [facility] hospital lab) *cepheid single result test* Anterior Nasal Swab     Status: None   Collection Time: 01/09/22  7:18 PM   Specimen: Anterior Nasal Swab  Result Value Ref Range Status   SARS Coronavirus 2 by RT PCR NEGATIVE NEGATIVE Final    Comment: (NOTE) SARS-CoV-2 target nucleic acids are NOT DETECTED.  The SARS-CoV-2 RNA is generally detectable in upper and lower respiratory specimens during the acute phase of infection. The lowest concentration of SARS-CoV-2 viral copies this assay can detect is 250 copies / mL. A negative result does not preclude SARS-CoV-2 infection and should not be  used as the sole basis for treatment or other patient management decisions.  A negative result may occur with improper specimen collection / handling, submission of specimen other than nasopharyngeal swab, presence of viral mutation(s) within the areas targeted by this assay, and inadequate number of viral copies (<250 copies / mL). A negative result must be combined with clinical observations, patient history, and epidemiological information.  Fact Sheet for Patients:   RoadLapTop.co.za  Fact Sheet for Healthcare Providers: http://kim-miller.com/  This test is not yet approved or  cleared by the [place] FDA and has been authorized for detection and/or diagnosis of SARS-CoV-2 by FDA under an Emergency Use Authorization (EUA).  This EUA will remain in effect (meaning this test can be used) for the duration of the COVID-19 declaration under Section 564(b)(1) of the Act, 21 U.S.C. section 360bbb-3(b)(1), unless the authorization is  terminated or revoked sooner.  Performed at [facility], [address] [place], [place] [address]   MRSA Next Gen by PCR, Nasal     Status: None   Collection Time: 01/09/22 10:18 PM   Specimen: Nasal Mucosa; Nasal Swab  Result Value Ref Range Status   MRSA by PCR Next Gen NOT DETECTED NOT DETECTED Final    Comment: (NOTE) The GeneXpert MRSA Assay (FDA approved for NASAL specimens only), is one component of a comprehensive MRSA colonization surveillance program. It is not intended to diagnose MRSA infection nor to guide or monitor treatment for MRSA infections. Test performance is not FDA approved in patients less than 52 years old. Performed at [facility], [address] [place], [place] [address]          Radiology Studies: No results found.      Scheduled Meds:  Chlorhexidine Gluconate Cloth  6 each Topical Daily   folic acid  1 mg Oral Daily   multivitamin with minerals  1 tablet Oral Daily   sodium chloride flush  3 mL Intravenous Q12H   thiamine  100 mg Oral Daily   Or   thiamine  100 mg Intravenous Daily   Continuous Infusions:  cefTRIAXone (ROCEPHIN)  IV Stopped (01/11/22 2051)   dextrose 5 % and 0.9% NaCl 100 mL/hr at 01/12/22 0441   octreotide (SANDOSTATIN) 500 mcg in sodium chloride 0.9 % 250 mL (2 mcg/mL) infusion 50 mcg/hr (01/12/22 0905)   pantoprazole 8 mg/hr (01/12/22 0935)     LOS: 3 days    Time spent: 40 minutes    Dorcas Carrow, MD [facility] Pager [phone]

## 2022-01-12 NOTE — TOC Initial Note (Signed)
Transition of Care [facility]) - Initial/Assessment Note    Patient Details  Name: Leroy Bell MRN: [id number] Date of Birth: Apr 14, 1961  [facility]) CM/SW Contact:    Coralyn Helling, LCSW Phone Number: 01/12/2022, 9:17 AM  Clinical Narrative:      TOC consulted for SA counseling/resources. history of chronic alcohol use, hepatic cirrhosis with portal venous hypertension, pancytopenia with iron deficiency anemia and chronic GI blood loss, hypertension hyperlipidemia obstructive sleep apnea on CPAP  . Patient ETOH levels have been less than 10 on his past admissions according to chart. SA resources added to patient chart. TOC will follow for dc needs.   PLAN: TBD   Expected Discharge Plan: Home/Self Care Barriers to Discharge: Continued Medical Work up   Patient Goals and CMS Choice        Expected Discharge Plan and Services Expected Discharge Plan: Home/Self Care       Living arrangements for the past 2 months: Single Family Home                                      Prior Living Arrangements/Services Living arrangements for the past 2 months: Single Family Home Lives with:: Spouse Patient language and need for interpreter reviewed:: Yes        Need for Family Participation in Patient Care: Yes (Comment) Care giver support system in place?: Yes (comment) Current home services: DME (CPAP) Criminal Activity/Legal Involvement Pertinent to Current Situation/Hospitalization: No - Comment as needed  Activities of Daily Living Home Assistive Devices/Equipment: CPAP ADL Screening (condition at time of admission) Patient's cognitive ability adequate to safely complete daily activities?: Yes Is the patient deaf or have difficulty hearing?: No Does the patient have difficulty seeing, even when wearing glasses/contacts?: No Does the patient have difficulty concentrating, remembering, or making decisions?: Yes Patient able to express need for assistance with  ADLs?: Yes Does the patient have difficulty dressing or bathing?: No Independently performs ADLs?: Yes (appropriate for developmental age) Does the patient have difficulty walking or climbing stairs?: No Weakness of Legs: None Weakness of Arms/Hands: None  Permission Sought/Granted   Permission granted to share information with : No              Emotional Assessment Appearance:: Appears stated age Attitude/Demeanor/Rapport: Unable to Assess Affect (typically observed): Unable to Assess Orientation: : Oriented to Self, Oriented to Place Alcohol / Substance Use: Alcohol Use Psych Involvement: No (comment)  Admission diagnosis:  Acute renal failure (ARF) ([name]) [N17.9] Patient Active Problem List   Diagnosis Date Noted   Acute renal failure (ARF) ([name]) 01/09/2022   Acute blood loss anemia 01/09/2022   Hypotension 01/09/2022   Thrombocytopenia ([name]) 01/09/2022   Essential hypertension 11/04/2021   Thiamine deficiency 11/04/2021   Heart murmur 11/04/2021   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 05/08/2021   DDD (degenerative disc disease), cervical 05/08/2021   Encounter for Department of Transportation (DOT) examination for trucking license [id number]   Anemia due to acquired thiamine deficiency 02/03/2021   Alcoholic cirrhosis of liver without ascites ([name]) 01/30/2021   Hyperlipidemia LDL goal <70 01/30/2021   Encounter for general adult medical examination with abnormal findings 01/29/2021   OSA on CPAP 01/29/2021   Need for vaccination 01/29/2021   Abnormal electrocardiogram (ECG) (EKG) 01/29/2021   Other pancytopenia ([name]) 09/09/2020   Iron deficiency anemia due to chronic blood loss 07/01/2020   Type 2 diabetes mellitus  without complication, without long-term current use of insulin ([place]) 09/01/2018   Alcohol abuse 08/23/2018   Healthcare maintenance 08/23/2018   Elevated LFTs 08/23/2018   HTN, goal below 130/80    Sinus tachycardia    Alcohol use with withdrawal ([place])  07/28/2018   PCP:  Janith Lima, MD Pharmacy:   [id number] - [place], [address] [place] [address] Phone: [phone] Fax: [phone]  [place], [place], [address] [place] [address] Phone: [phone] Fax: [phone]     Social Determinants of Health (SDOH) Interventions    Readmission Risk Interventions     No data to display

## 2022-01-12 NOTE — Progress Notes (Signed)
[facility] Gastroenterology Progress Note  Leroy Bell 61 y.o. October 22, 1960   Subjective: Patient seen and examined lying in bed.  Patient has minimal verbal responses to questions.  Son at bedside.  Denies further episodes of vomiting.    Objective: Vital signs in last 24 hours: Vitals:   01/12/22 0600 01/12/22 0727  BP: (!) 148/71   Pulse: 61   Resp: (!) 23   Temp:  98.2 F (36.8 C)  SpO2: 94%     Physical Exam:  General:  no distress, appears stated age, jaundiced  Head:  Normocephalic, without obvious abnormality, atraumatic  Eyes:  icteric sclera, EOM's intact  Lungs:   Clear to auscultation bilaterally, respirations unlabored  Heart:  Regular rate and rhythm, S1, S2 normal  Abdomen:   Soft, non-tender, bowel sounds active all four quadrants,  no masses,   Extremities: Extremities normal, atraumatic, no  edema  Pulses: 2+ and symmetric    Lab Results: Recent Labs    01/10/22 0456 01/11/22 0026 01/12/22 0259  NA 133* 135 137  K 4.5 4.3 4.2  CL 106 109 112*  CO2 22 23 21*  GLUCOSE 156* 169* 193*  BUN 90* 51* 29*  CREATININE 1.66* 1.40* 1.17  CALCIUM 7.9* 7.8* 7.7*  MG 2.4  --  2.0   Recent Labs    01/11/22 0026 01/12/22 0259  AST 108* 95*  ALT 63* 56*  ALKPHOS 61 58  BILITOT 5.5* 7.9*  PROT 5.3* 5.1*  ALBUMIN 2.4* 2.2*   Recent Labs    01/11/22 1458 01/12/22 0259  WBC 4.2 4.1  NEUTROABS 2.9 2.8  HGB 10.6* 10.3*  HCT 32.5* 31.4*  MCV 105.2* 105.0*  PLT 57* 65*   Recent Labs    01/09/22 1836  LABPROT 21.5*  INR 1.9*    Assessment Alcoholic cirrhosis Coffee-ground emesis  HGB 10.3(10.6) Platelets 65(67) AST 95(108) ALT 56(63)  Alkphos 58(61) TBili 7.9(5.5) GFR >60  INR 01/09/2022 1.9  MELD 3.0: 26 at 01/11/2022 12:26 AM MELD-Na: 24 at 01/11/2022 12:26 AM Calculated from: Serum Creatinine: 1.40 mg/dL at 01/11/2022 12:26 AM Serum Sodium: 135 mmol/L at 01/11/2022 12:26 AM Total Bilirubin: 5.5 mg/dL at 01/11/2022 12:26 AM Serum Albumin:  2.4 g/dL at 01/11/2022 12:26 AM INR(ratio): 1.9 at 01/09/2022  6:36 PM Age at listing (hypothetical): 43 years Sex: Male at 01/11/2022 12:26 AM  Patient has minimal verbal response today.  No further reported episodes of hematemesis or melena.  Hemoglobin stable at 10.3.  BUN improved at 29.   Plan: Plan for EGD tentatively Wednesday or Thursday when patient is more alert and recovered from alcohol withdrawal.  Continue Protonix IV 80 mg drip Continue thiamine, multivitamin, folic acid Continue octreotide IV 50 mcg/hr Continue clear liquid diet Continue anti-emetics and supportive care as needed. [facility] will follow.     Arvella Nigh Jilliam Bellmore PA-C 01/12/2022, 10:58 AM  Contact #  [phone]

## 2022-01-13 DIAGNOSIS — D62 Acute posthemorrhagic anemia: Secondary | ICD-10-CM | POA: Diagnosis not present

## 2022-01-13 LAB — COMPREHENSIVE METABOLIC PANEL
ALT: 54 U/L — ABNORMAL HIGH (ref 0–44)
AST: 93 U/L — ABNORMAL HIGH (ref 15–41)
Albumin: 2.2 g/dL — ABNORMAL LOW (ref 3.5–5.0)
Alkaline Phosphatase: 61 U/L (ref 38–126)
Anion gap: 5 (ref 5–15)
BUN: 19 mg/dL (ref 6–20)
CO2: 20 mmol/L — ABNORMAL LOW (ref 22–32)
Calcium: 7.6 mg/dL — ABNORMAL LOW (ref 8.9–10.3)
Chloride: 112 mmol/L — ABNORMAL HIGH (ref 98–111)
Creatinine, Ser: 0.99 mg/dL (ref 0.61–1.24)
GFR, Estimated: >60 mL/min (ref >60)
Glucose, Bld: 119 mg/dL — ABNORMAL HIGH (ref 70–99)
Potassium: 4.3 mmol/L (ref 3.5–5.1)
Sodium: 137 mmol/L (ref 135–145)
Total Bilirubin: 10.8 mg/dL — ABNORMAL HIGH (ref 0.3–1.2)
Total Protein: 5 g/dL — ABNORMAL LOW (ref 6.5–8.1)

## 2022-01-13 LAB — CBC WITH DIFFERENTIAL/PLATELET
Abs Immature Granulocytes: 0.02 10*3/uL (ref 0.00–0.07)
Basophils Absolute: 0 10*3/uL (ref 0.0–0.1)
Basophils Relative: 1 %
Eosinophils Absolute: 0.1 10*3/uL (ref 0.0–0.5)
Eosinophils Relative: 3 %
HCT: 30.6 % — ABNORMAL LOW (ref 39.0–52.0)
Hemoglobin: 10 g/dL — ABNORMAL LOW (ref 13.0–17.0)
Immature Granulocytes: 1 %
Lymphocytes Relative: 11 %
Lymphs Abs: 0.5 10*3/uL — ABNORMAL LOW (ref 0.7–4.0)
MCH: 34 pg (ref 26.0–34.0)
MCHC: 32.7 g/dL (ref 30.0–36.0)
MCV: 104.1 fL — ABNORMAL HIGH (ref 80.0–100.0)
Monocytes Absolute: 0.7 10*3/uL (ref 0.1–1.0)
Monocytes Relative: 16 %
Neutro Abs: 3 10*3/uL (ref 1.7–7.7)
Neutrophils Relative %: 68 %
Platelets: 66 10*3/uL — ABNORMAL LOW (ref 150–400)
RBC: 2.94 MIL/uL — ABNORMAL LOW (ref 4.22–5.81)
RDW: 17.5 % — ABNORMAL HIGH (ref 11.5–15.5)
WBC: 4.4 10*3/uL (ref 4.0–10.5)
nRBC: 0 % (ref 0.0–0.2)

## 2022-01-13 LAB — GLUCOSE, CAPILLARY
Glucose-Capillary: 106 mg/dL — ABNORMAL HIGH (ref 70–99)
Glucose-Capillary: 110 mg/dL — ABNORMAL HIGH (ref 70–99)
Glucose-Capillary: 128 mg/dL — ABNORMAL HIGH (ref 70–99)
Glucose-Capillary: 128 mg/dL — ABNORMAL HIGH (ref 70–99)
Glucose-Capillary: 151 mg/dL — ABNORMAL HIGH (ref 70–99)
Glucose-Capillary: 99 mg/dL (ref 70–99)

## 2022-01-13 LAB — AMMONIA: Ammonia: 44 umol/L — ABNORMAL HIGH (ref 9–35)

## 2022-01-13 MED ORDER — PANTOPRAZOLE SODIUM 40 MG IV SOLR
40.0000 mg | Freq: Two times a day (BID) | INTRAVENOUS | Status: DC
  Administered 2022-01-13 – 2022-01-15 (×6): 40 mg via INTRAVENOUS
  Filled 2022-01-13 (×6): qty 10

## 2022-01-13 MED ORDER — LACTULOSE 10 GM/15ML PO SOLN
30.0000 g | Freq: Once | ORAL | Status: AC
  Administered 2022-01-13: 30 g via ORAL
  Filled 2022-01-13: qty 45

## 2022-01-13 MED ORDER — LORAZEPAM 2 MG/ML IJ SOLN
1.0000 mg | INTRAMUSCULAR | Status: DC | PRN
  Administered 2022-01-13 – 2022-01-16 (×5): 1 mg via INTRAVENOUS
  Filled 2022-01-13 (×5): qty 1

## 2022-01-13 MED ORDER — SODIUM CHLORIDE 0.9 % IV SOLN: INTRAVENOUS | Status: DC | PRN

## 2022-01-13 NOTE — Evaluation (Signed)
Clinical/Bedside Swallow Evaluation Patient Details  Name: Leroy Bell MRN: [id number] Date of Birth: 07/15/1960  Today's Date: 01/13/2022 Time: SLP Start Time (ACUTE ONLY): 55 SLP Stop Time (ACUTE ONLY): 1305 SLP Time Calculation (min) (ACUTE ONLY): 34 min  Past Medical History:  Past Medical History:  Diagnosis Date   Alcohol abuse    Coronary atherosclerosis due to calcified coronary lesion    Diabetes ([place])    Dyspnea    from anemia   Hyperlipidemia    Hypertension    Past Surgical History:  Past Surgical History:  Procedure Laterality Date   NO PAST SURGERIES     HPI:  61 yo male adm to [facility] with acute blood loss - concern for GI bleed - Plan is for endoscopy 01/14/2022.   He is on CIWA protocol.   PMH: DM, HLD, HTN, ETOH abuse, hepatic cirrhosis.  Pt owns a [employer].    Assessment / Plan / Recommendation  Clinical Impression  Patient presents with cognitive and respiratory based dysphagia - He is demonstrating elevated RR (up to high 20's) with expiratory wheeze at rest and with intake.  Currently pt requiring someone to feed him due to his mentation, tremors and weakness.  Difficulty maintaining oral closure noted resulting in labial air loss. SLP provided pt with jello, nectar thick juice, thin juice and water.  Swallow was delayed with several respiratory cycles completed before swallowing increasing asp risk and delays in swallow up to 6 seconds with liquids.  Difficulty forming adequate suction to propel liquid into straw noted - due to lingual/intra oral weakness.  Poor manipulation/oral transiting of small bolus resulted in prolonged transit and oral retention - requiring more than 1 minute and cues to swallow.  Weak cough noted x2 during po intake - occuring with both thin and nectar via cup/straw.  Pt's wife present and denies pt having issues swallowing PTA.  Given pt's current diminished respiratory status, weakness and s/s of dysphagia *? esophageal  component as well*, recommend continue clears via tsp and medicine with applesauce pending endoscopy planned by GI.  Will follow up 01/14/2022 after endoscopy if pt awakens adequately -as wife reports Sherren Mocha has issues "coming out of anesthesia".  Using teach back, wife and pt educated to findings and recommendations.  Pt's wife currently with improved understanding of current situation and recommendation over patient.  RN informed and agreeable to plan. SLP Visit Diagnosis: Dysphagia, oropharyngeal phase (R13.12);Dysphagia, unspecified (R13.10)    Aspiration Risk  Risk for inadequate nutrition/hydration;Moderate aspiration risk    Diet Recommendation Thin liquid (via tsp)   Liquid Administration via: Spoon;No straw Medication Administration: Via alternative means (or with applesauce) Supervision: Staff to assist with self feeding Compensations: Slow rate;Small sips/bites;Other (Comment) (observe pt to swallow before giving more)    Other  Recommendations Oral Care Recommendations: Oral care BID    Recommendations for follow up therapy are one component of a multi-disciplinary discharge planning process, led by the attending physician.  Recommendations may be updated based on patient status, additional functional criteria and insurance authorization.  Follow up Recommendations No SLP follow up      Assistance Recommended at Discharge Frequent or constant Supervision/Assistance  Functional Status Assessment Patient has had a recent decline in their functional status and demonstrates the ability to make significant improvements in function in a reasonable and predictable amount of time.  Frequency and Duration min 1 x/week  1 week       Prognosis Prognosis for Safe Diet Advancement: Fair  Barriers to Reach Goals: Time post onset      Swallow Study   General Date of Onset: 01/13/22 HPI: 61 yo male adm to [facility] with acute blood loss - concern for GI bleed - Plan is for endoscopy 01/14/2022.    He is on CIWA protocol.   PMH: DM, HLD, HTN, ETOH abuse, hepatic cirrhosis.  Pt owns a trucking company. Respiratory Status: Nasal cannula History of Recent Intubation: No Behavior/Cognition: Alert;Cooperative;Pleasant mood Oral Care Completed by SLP: No Oral Cavity - Dentition: Adequate natural dentition Vision: Functional for self-feeding Baseline Vocal Quality: Hoarse Volitional Cough: Strong Volitional Swallow: Unable to elicit    Oral/Motor/Sensory Function Overall Oral Motor/Sensory Function: Generalized oral weakness   Ice Chips Ice chips: Not tested   Thin Liquid Thin Liquid: Impaired Presentation: Cup;Spoon;Straw Oral Phase Impairments: Reduced labial seal;Reduced lingual movement/coordination Oral Phase Functional Implications: Prolonged oral transit Pharyngeal  Phase Impairments: Suspected delayed Swallow;Cough - Delayed;Multiple swallows    Nectar Thick Nectar Thick Liquid: Impaired Presentation: Cup;Straw;Spoon Oral Phase Impairments: Reduced labial seal;Reduced lingual movement/coordination Oral phase functional implications: Prolonged oral transit Pharyngeal Phase Impairments: Suspected delayed Swallow;Cough - Delayed;Multiple swallows   Honey Thick Honey Thick Liquid: Not tested   Puree Puree: Impaired Presentation: Self Fed;Spoon Oral Phase Impairments: Reduced labial seal;Reduced lingual movement/coordination;Impaired mastication;Poor awareness of bolus Oral Phase Functional Implications: Prolonged oral transit;Oral residue Pharyngeal Phase Impairments: Suspected delayed Swallow   Solid     Solid: Not tested      Chales Abrahams 01/13/2022,3:49 PM  Rolena Infante, MS [facility] SLP Acute Rehab Services Office [phone] Pager [phone]

## 2022-01-13 NOTE — Evaluation (Addendum)
Physical Therapy Evaluation Patient Details Name: Leroy Bell MRN: [id number] DOB: [birth year] Today's Date: 01/13/2022  History of Present Illness  Pt is a 61yo male presenting to [facility] ED on 01/09/22 with complaints of nausea & vomiting, diarrhea found down. PMH: DM, HLD, HTN, ETOH abuse, hepatic cirrhosis.   Clinical Impression  Pt presents with problems listed above and functional impairments below and was seen in co-eval with OT. Pt sleeping in bed and was somewhat slow to arouse, eventually able to rouse with tactile methods; alert and oriented x3 with cuing for month but with slower than normal response time confirmed by wife Leroy Bell who was present for session. Pt reporting two recent falls, one while attempting to ascend stairs from den wherein he injured his R shoulder but reports no pain today. 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 BP in supine prior to mobility 157/70, SpO2 96% on RA, HR 53. Pt demonstrated functional ROM and strength in bilateral legs. Required min guard for bed mobility and min assist for transfers for steadying. Recommending HHPT upon discharge to address functional limitations as well as RW for increased stability and safety. Will continue to follow acutely.      Recommendations for follow up therapy are one component of a multi-disciplinary discharge planning process, led by the attending physician.  Recommendations may be updated based on patient status, additional functional criteria and insurance authorization.  Follow Up Recommendations Home health PT      Assistance Recommended at Discharge Intermittent Supervision/Assistance  Patient can return home with the following  A little help with walking and/or transfers;A little help with bathing/dressing/bathroom;Assistance with cooking/housework;Assist for transportation;Help with stairs or ramp for entrance    Equipment Recommendations Rolling walker (2 wheels)  Recommendations for Other  Services       Functional Status Assessment Patient has had a recent decline in their functional status and demonstrates the ability to make significant improvements in function in a reasonable and predictable amount of time.     Precautions / Restrictions Precautions Precautions: Fall Precaution Comments: two recent falls pta Restrictions Weight Bearing Restrictions: No      Mobility  Bed Mobility Overal bed mobility: Needs Assistance Bed Mobility: Supine to Sit     Supine to sit: Min guard, HOB elevated     General bed mobility comments: Min guard for safety, no physical assist required, increased time, HOB elevated. Pt sat EOB and rested for ~60s prior to attempting transfer    Transfers Overall transfer level: Needs assistance Equipment used: Rolling walker (2 wheels) Transfers: Sit to/from Stand, Bed to chair/wheelchair/BSC Sit to Stand: Min assist, From elevated surface, +2 safety/equipment   Step pivot transfers: Min assist, +2 safety/equipment       General transfer comment: Pt required min assist for guidance of hands to RW grips, steadying of RW, and light blocking of BLE, no lifting assist required. Step pivot:min assist for steadying, multimodal cuing for sequencing and moving RW, pt taking very small steps barely lifting feet off floor. Stand to sit:pt did not follow directions to use BUe hands on recliner armrest and sat down heavily.    Ambulation/Gait               General Gait Details: deferred  Stairs            Wheelchair Mobility    Modified Rankin (Stroke Patients Only)       Balance Overall balance assessment: Needs assistance Sitting-balance support: Feet supported, No  upper extremity supported Sitting balance-[name]: Good     Standing balance support: Reliant on assistive device for balance, During functional activity, Bilateral upper extremity supported Standing balance-[name]: Poor                                Pertinent Vitals/Pain Pain Assessment Pain Assessment: No/denies pain    Home Living Family/patient expects to be discharged to:: Private residence Living Arrangements: Spouse/significant other Available Help at Discharge: Family;Available 24 hours/day Type of Home: House Home Access: Stairs to enter Entrance Stairs-Rails: None Entrance Stairs-Number of Steps: 3 Alternate Level Stairs-Number of Steps: 2 Home Layout: Multi-level Home Equipment: Emergency planning/management officer (2 wheels)      Prior Function Prior Level of Function : Working/employed;Driving;History of Falls (last six months) ([place])             Mobility Comments: IND, two falls in the last two days ADLs Comments: IND     Hand Dominance   Dominant Hand: Right    Extremity/Trunk Assessment   Upper Extremity Assessment Upper Extremity Assessment: Defer to OT evaluation    Lower Extremity Assessment Lower Extremity Assessment: RLE deficits/detail;LLE deficits/detail;Generalized weakness RLE Deficits / Details: MMT grossly 4+/5, ROM functional with decreased DF. Pulses 2+, slightly edematous. RLE Sensation: WNL RLE Coordination: WNL LLE Deficits / Details: MMT grossly 4+/5, ROM functional, decreased DF, Pulses 2+, slightly edematous LLE Sensation: WNL LLE Coordination: WNL    Cervical / Trunk Assessment Cervical / Trunk Assessment: Normal  Communication   Communication: No difficulties  Cognition Arousal/Alertness: Lethargic Behavior During Therapy: WFL for tasks assessed/performed, Flat affect Overall Cognitive Status: Within Functional Limits for tasks assessed                                 General Comments: Pt AOx4 with slow responses, wife reports pt is at baseline just responding slower than normal. Pt able to follow single and multistep commands consistently with increased time and multimodal cuing, verbal cuing was  not sufficient.        General Comments General comments (skin integrity, edema, etc.): Wife Leroy Bell present    Exercises Total Joint Exercises Ankle Circles/Pumps: AROM, Both, 10 reps, Supine   Assessment/Plan    PT Assessment Patient needs continued PT services  PT Problem List Decreased strength;Decreased activity tolerance;Decreased range of motion;Decreased balance;Decreased mobility;Decreased safety awareness;Pain       PT Treatment Interventions DME instruction;Gait training;Stair training;Functional mobility training;Therapeutic activities;Therapeutic exercise;Balance training;Patient/family education;Neuromuscular re-education    PT Goals (Current goals can be found in the Care Plan section)  Acute Rehab PT Goals Patient Stated Goal: To go home PT Goal Formulation: With patient Time For Goal Achievement: 01/27/22 Potential to Achieve Goals: Good    Frequency Min 3X/week     Co-evaluation PT/OT/SLP Co-Evaluation/Treatment: Yes Reason for Co-Treatment: For patient/therapist safety;To address functional/ADL transfers PT goals addressed during session: Mobility/safety with mobility;Proper use of DME OT goals addressed during session: ADL's and self-care;Proper use of Adaptive equipment and DME       AM-PAC PT "6 Clicks" Mobility  Outcome Measure Help needed turning from your back to your side while in a flat bed without using bedrails?: A Little Help needed moving from lying on your back to sitting on the side of a flat bed without using bedrails?: A Little Help needed moving to and from  a bed to a chair (including a wheelchair)?: A Little Help needed standing up from a chair using your arms (e.g., wheelchair or bedside chair)?: A Little Help needed to walk in hospital room?: A Little Help needed climbing 3-5 steps with a railing? : A Lot 6 Click Score: 17    End of Session Equipment Utilized During Treatment: Gait belt Activity Tolerance: No increased  pain;Patient tolerated treatment well Patient left: in chair;with call bell/phone within reach;with chair alarm set;with family/visitor present Nurse Communication: Mobility status PT Visit Diagnosis: History of falling (Z91.81);Difficulty in walking, not elsewhere classified (R26.2);Unsteadiness on feet (R26.81)    Time: 1020-1047 PT Time Calculation (min) (ACUTE ONLY): 27 min   Charges:   PT Evaluation $PT Eval Low Complexity: 1 Low          Jamesetta Geralds, PT, DPT [facility] Rehabilitation Department Office: [phone]  Dragan Tamburrino 01/13/2022, 11:27 AM

## 2022-01-13 NOTE — Progress Notes (Signed)
PROGRESS NOTE    Leroy Bell  UUV:[id number] DOB: 1961-02-10 DOA: 01/09/2022 PCP: Etta Grandchild, MD    Brief Narrative:  61 year old with history of chronic alcohol use, hepatic cirrhosis with portal venous hypertension, pancytopenia with iron deficiency anemia and chronic GI blood loss, hypertension, hyperlipidemia, obstructive sleep apnea on CPAP found at home with several days of nausea vomiting and diarrhea, daughter went home and found him down on the floor tremulous, also noted to have black stool.  In the emergency room blood pressure stable.  On room air.  After arrival, blood pressure dropped to 87/69.  Hemoglobin 7 with recent hemoglobin of 12.8.  Bilirubin 5.4.  FOBT positive.  Fluid resuscitation started and admitted to the hospital. Remains in the hospital with severe alcohol withdrawal that is slow to improve. 9/19, mental status is somewhat improving.  Hemoglobin remains stable.  Bilirubin elevated.   Assessment & Plan:   Acute on chronic GI bleeding, suspect variceal bleed in a patient with portal hypertension and known alcoholism with liver failure. Hemoglobin 5.5 on presentation. Given total of 4 units of PRBC, hemoglobin has remained stable since initial transfusions. Vitamin K 5 mg 2 doses given. Platelets are adequate, currently no indication for transfusion. Rocephin for prophylaxis. Protonix infusion now changed to IV Protonix.  Continue octreotide.  GI following. Gastroenterology following, likely upper GI endoscopy after some stabilization. On clears.  Aspiration precautions. Speech, PT OT today.  Attempt out of bed today.  Acute kidney injury: Due to above.  Stabilizing.    Hemorrhagic shock: Due to above.  Blood pressures are adequate now.  Continue to hold antihypertensives.  Alcohol use with withdrawal with delirium tremens.   Fall precautions.  Delirium precautions.  Seizure precautions.  On multivitamins and benzodiazepines.  Close  monitoring. Continue aspiration precautions. Some clinical improvement today.  Alcoholism with alcohol use disorder/chronic liver failure: Poor prognosis.  Supportive care.  Ongoing counseling.  GI following.  OSA on CPAP: Use CPAP at night.   DVT prophylaxis: SCDs Start: 01/09/22 2233   Code Status: Full code Family Communication: Son at the bedside. Disposition Plan: Status is: Inpatient Remains inpatient appropriate because: Delirium tremens.  GI bleeding.  On active treatment.     Consultants:  Gastroenterology  Procedures:  None  Antimicrobials:  Rocephin 9/15---   Subjective:  Patient seen and examined.  Today he was more alert and able to answer appropriately but is still remains very sleepy.  Son at the bedside.  Still having difficulty swallowing with some coughing in between.  Patient himself denies any complaints.  Denies any nausea vomiting.  Breathing patterns looks stable and he is on room air. No bowel movement since admission.  We will give 1 dose of lactulose.  Objective: Vitals:   01/13/22 0349 01/13/22 0400 01/13/22 0600 01/13/22 0744  BP:  (!) 147/61 (!) 156/67   Pulse:  69 60   Resp:  (!) 24 (!) 22   Temp: 98.1 F (36.7 C)   97.8 F (36.6 C)  TempSrc: Oral   Axillary  SpO2:  93% 97%   Weight:  85.9 kg    Height:        Intake/Output Summary (Last 24 hours) at 01/13/2022 1023 Last data filed at 01/13/2022 0700 Gross per 24 hour  Intake 1377.92 ml  Output 925 ml  Net 452.92 ml   Filed Weights   01/11/22 0500 01/12/22 0400 01/13/22 0400  Weight: 84.7 kg 84.7 kg 85.9 kg    Examination:  General: Sick looking.  Deeply icteric. Mostly sleepy.  he wakes up and answers questions appropriately. Moves all extremities on command.  Not sure tremulous today. Cardiovascular: S1-S2 normal.  No added sounds. Respiratory: Conducted upper airway sounds.  On room air today. Gastrointestinal: Soft.  Nontender.  Bowel sound present. Ext: No swelling  or edema.  No cyanosis. Neuro: Flat affect.  Can move all extremities.  Sleepy. Musculoskeletal: No deformities.     Data Reviewed: I have personally reviewed following labs and imaging studies  CBC: Recent Labs  Lab 01/10/22 1701 01/11/22 0026 01/11/22 1458 01/12/22 0259 01/13/22 0322  WBC 4.0 3.7* 4.2 4.1 4.4  NEUTROABS 2.6 2.3 2.9 2.8 3.0  HGB 9.8* 9.2* 10.6* 10.3* 10.0*  HCT 29.1* 27.2* 32.5* 31.4* 30.6*  MCV 100.7* 101.5* 105.2* 105.0* 104.1*  PLT 68* 67* 57* 65* 66*   Basic Metabolic Panel: Recent Labs  Lab 01/09/22 2022 01/10/22 0456 01/11/22 0026 01/12/22 0259 01/13/22 0322  NA 129* 133* 135 137 137  K 4.4 4.5 4.3 4.2 4.3  CL 99 106 109 112* 112*  CO2 22 22 23  21* 20*  GLUCOSE 196* 156* 169* 193* 119*  BUN 102* 90* 51* 29* 19  CREATININE 1.94* 1.66* 1.40* 1.17 0.99  CALCIUM 8.2* 7.9* 7.8* 7.7* 7.6*  MG  --  2.4  --  2.0  --    GFR: Estimated Creatinine Clearance: 84.1 mL/min (by C-G formula based on SCr of 0.99 mg/dL). Liver Function Tests: Recent Labs  Lab 01/09/22 1418 01/09/22 2022 01/11/22 0026 01/12/22 0259 01/13/22 0322  AST 133* 101* 108* 95* 93*  ALT 73* 57* 63* 56* 54*  ALKPHOS 75 61 61 58 61  BILITOT 5.4* 4.5* 5.5* 7.9* 10.8*  PROT 5.8* 4.9* 5.3* 5.1* 5.0*  ALBUMIN 2.9* 2.7* 2.4* 2.2* 2.2*   Recent Labs  Lab 01/09/22 1418  LIPASE 59*   Recent Labs  Lab 01/09/22 1836  AMMONIA 52*   Coagulation Profile: Recent Labs  Lab 01/09/22 1836 01/12/22 1118  INR 1.9* 2.1*   Cardiac Enzymes: Recent Labs  Lab 01/09/22 1836  CKTOTAL 363   BNP (last 3 results) Recent Labs    01/29/21 1406  PROBNP 46.0   HbA1C: No results for input(s): "HGBA1C" in the last 72 hours. CBG: Recent Labs  Lab 01/12/22 1646 01/12/22 2009 01/12/22 2343 01/13/22 0320 01/13/22 0716  GLUCAP 107* 125* 137* 110* 128*   Lipid Profile: No results for input(s): "CHOL", "HDL", "LDLCALC", "TRIG", "CHOLHDL", "LDLDIRECT" in the last 72 hours. Thyroid  Function Tests: No results for input(s): "TSH", "T4TOTAL", "FREET4", "T3FREE", "THYROIDAB" in the last 72 hours. Anemia Panel: No results for input(s): "VITAMINB12", "FOLATE", "FERRITIN", "TIBC", "IRON", "RETICCTPCT" in the last 72 hours.  Sepsis Labs: Recent Labs  Lab 01/09/22 1836  LATICACIDVEN 1.6    Recent Results (from the past 240 hour(s))  SARS Coronavirus 2 by RT PCR (hospital order, performed in [facility] hospital lab) *cepheid single result test* Anterior Nasal Swab     Status: None   Collection Time: 01/09/22  7:18 PM   Specimen: Anterior Nasal Swab  Result Value Ref Range Status   SARS Coronavirus 2 by RT PCR NEGATIVE NEGATIVE Final    Comment: (NOTE) SARS-CoV-2 target nucleic acids are NOT DETECTED.  The SARS-CoV-2 RNA is generally detectable in upper and lower respiratory specimens during the acute phase of infection. The lowest concentration of SARS-CoV-2 viral copies this assay can detect is 250 copies / mL. A negative result does not preclude  SARS-CoV-2 infection and should not be used as the sole basis for treatment or other patient management decisions.  A negative result may occur with improper specimen collection / handling, submission of specimen other than nasopharyngeal swab, presence of viral mutation(s) within the areas targeted by this assay, and inadequate number of viral copies (<250 copies / mL). A negative result must be combined with clinical observations, patient history, and epidemiological information.  Fact Sheet for Patients:   https://www.patel.info/  Fact Sheet for Healthcare Providers: https://hall.com/  This test is not yet approved or  cleared by the [place] FDA and has been authorized for detection and/or diagnosis of SARS-CoV-2 by FDA under an Emergency Use Authorization (EUA).  This EUA will remain in effect (meaning this test can be used) for the duration of the COVID-19  declaration under Section 564(b)(1) of the Act, 21 U.S.C. section 360bbb-3(b)(1), unless the authorization is terminated or revoked sooner.  Performed at [facility], [address], [place], [place] [address]   MRSA Next Gen by PCR, Nasal     Status: None   Collection Time: 01/09/22 10:18 PM   Specimen: Nasal Mucosa; Nasal Swab  Result Value Ref Range Status   MRSA by PCR Next Gen NOT DETECTED NOT DETECTED Final    Comment: (NOTE) The GeneXpert MRSA Assay (FDA approved for NASAL specimens only), is one component of a comprehensive MRSA colonization surveillance program. It is not intended to diagnose MRSA infection nor to guide or monitor treatment for MRSA infections. Test performance is not FDA approved in patients less than 70 years old. Performed at [facility], [place] [address] [place], [place] [address]          Radiology Studies: DG CHEST PORT 1 VIEW  Result Date: 01/12/2022 CLINICAL DATA:  Shortness of breath. EXAM: PORTABLE CHEST 1 VIEW COMPARISON:  07/28/2018 FINDINGS: The cardiac silhouette, mediastinal and hilar contours are within normal limits and stable. Moderate stable eventration of the left hemidiaphragm. Low lung volumes with mild vascular crowding and streaky atelectasis. There is also mild central vascular congestion but no overt pulmonary edema pleural effusions. IMPRESSION: 1. Low lung volumes with vascular crowding and streaky atelectasis. 2. Mild central vascular congestion but no overt pulmonary edema or pleural effusions. Electronically Signed   By: Marijo Sanes M.D.   On: 01/12/2022 11:11        Scheduled Meds:  Chlorhexidine Gluconate Cloth  6 each Topical Daily   folic acid  1 mg Oral Daily   lactulose  30 g Oral Once   multivitamin with minerals  1 tablet Oral Daily   pantoprazole (PROTONIX) IV  40 mg Intravenous Q12H   sodium chloride flush  3 mL Intravenous Q12H   thiamine  100 mg Oral Daily   Or    thiamine  100 mg Intravenous Daily   Continuous Infusions:  cefTRIAXone (ROCEPHIN)  IV Stopped (01/12/22 2037)   octreotide (SANDOSTATIN) 500 mcg in sodium chloride 0.9 % 250 mL (2 mcg/mL) infusion 50 mcg/hr (01/13/22 0625)     LOS: 4 days    Time spent: 35 minutes    Barb Merino, MD [facility] Pager [phone]

## 2022-01-13 NOTE — Evaluation (Signed)
Occupational Therapy Evaluation Patient Details Name: Leroy Bell MRN: [id number] DOB: [birth year] Today's Date: 01/13/2022   History of Present Illness Pt is a 61yo male [facility] ED on 01/09/22 with complaints of nausea & vomiting, diarrhea found down. PMH: DM, HLD, HTN, ETOH abuse, hepatic cirrhosis   Clinical Impression   Patient is a 61 year old male who was admitted for above. Patient was living at home independently with wife prior level. Patient was noted to be lethargic with increased pain in R shoulder. Patient was min A +2 for movement with safety for lines with RW. Patient was max A for LB dressing tasks. Patient was noted to have decreased functional activity tolerance, decreased endurance, decreased standing balance, decreased safety awareness, and decreased knowledge of AD/AE impacting participation in ADLs. Patient would continue to benefit from skilled OT services at this time while admitted and after d/c to address noted deficits in order to improve overall safety and independence in ADLs.        Recommendations for follow up therapy are one component of a multi-disciplinary discharge planning process, led by the attending physician.  Recommendations may be updated based on patient status, additional functional criteria and insurance authorization.   Follow Up Recommendations  Home health OT    Assistance Recommended at Discharge Frequent or constant Supervision/Assistance  Patient can return home with the following A lot of help with bathing/dressing/bathroom;Direct supervision/assist for medications management;Direct supervision/assist for financial management;Assist for transportation;Assistance with cooking/housework;Help with stairs or ramp for entrance;A lot of help with walking and/or transfers    Functional Status Assessment  Patient has had a recent decline in their functional status and demonstrates the ability to make significant improvements in function  in a reasonable and predictable amount of time.  Equipment Recommendations  None recommended by OT    Recommendations for Other Services       Precautions / Restrictions Precautions Precautions: Fall Precaution Comments: two recent falls pta Restrictions Weight Bearing Restrictions: No      Mobility Bed Mobility Overal bed mobility: Needs Assistance Bed Mobility: Supine to Sit     Supine to sit: Min guard, HOB elevated     General bed mobility comments: Min guard for safety, no physical assist required, increased time, HOB elevated. Pt sat EOB and rested for ~60s prior to attempting transfer    Transfers Overall transfer level: Needs assistance Equipment used: Rolling walker (2 wheels) Transfers: Sit to/from Stand, Bed to chair/wheelchair/BSC Sit to Stand: Min assist, From elevated surface, +2 safety/equipment     Step pivot transfers: Min assist, +2 safety/equipment     General transfer comment: Pt required min assist for guidance of hands to RW grips, steadying of RW, and light blocking of BLE, no lifting assist required. Step pivot:min assist for steadying, multimodal cuing for sequencing and moving RW, pt taking very small steps barely lifting feet off floor. Stand to sit:pt did not follow directions to use BUe hands on recliner armrest and sat down heavily.      Balance Overall balance assessment: Needs assistance Sitting-balance support: Feet supported, No upper extremity supported Sitting balance-[name]: Good     Standing balance support: Reliant on assistive device for balance, During functional activity, Bilateral upper extremity supported Standing balance-[name]: Poor                             ADL either performed or assessed with clinical judgement  ADL Overall ADL's : Needs assistance/impaired Eating/Feeding: NPO   Grooming: Set up;Sitting   Upper Body Bathing: Minimal assistance;Sitting   Lower Body Bathing: Maximal  assistance;Bed level   Upper Body Dressing : Minimal assistance;Sitting   Lower Body Dressing: Bed level;Maximal assistance Lower Body Dressing Details (indicate cue type and reason): patient attempted to don bilateral socks with feet to knees sitting in bed with patient noted to have increased SOB and inability to get sock over toes to pull up with multiple attempts bilaterally Toilet Transfer: Minimal assistance;+2 for safety/equipment;+2 for physical assistance Toilet Transfer Details (indicate cue type and reason): with increased time and safety for line management. needed physical assist to advance RW Toileting- Clothing Manipulation and Hygiene: Maximal assistance;Sit to/from stand               Vision Patient Visual Report: No change from baseline       Perception     Praxis      Pertinent Vitals/Pain Pain Assessment Pain Assessment: No/denies pain     Hand Dominance Right   Extremity/Trunk Assessment Upper Extremity Assessment Upper Extremity Assessment: Overall WFL for tasks assessed (strength was intact but patient reported having fallen on R shoulder with pain.)   Lower Extremity Assessment Lower Extremity Assessment: Defer to PT evaluation   Cervical / Trunk Assessment Cervical / Trunk Assessment: Normal   Communication Communication Communication: No difficulties   Cognition Arousal/Alertness: Lethargic Behavior During Therapy: WFL for tasks assessed/performed, Flat affect Overall Cognitive Status: Within Functional Limits for tasks assessed                                 General Comments: Pt AOx4 with slow responses, wife reports pt is at baseline just responding slower than normal. Pt able to follow single and multistep commands consistently with increased time and multimodal cuing, verbal cuing was not sufficient.     General Comments       Exercises     Shoulder Instructions      Home Living Family/patient expects to be  discharged to:: Private residence Living Arrangements: Spouse/significant other Available Help at Discharge: Family;Available 24 hours/day Type of Home: House Home Access: Stairs to enter [employer] of Steps: 3 Entrance Stairs-Rails: None Home Layout: Multi-level Alternate Level Stairs-Number of Steps: 2 Alternate Level Stairs-Rails: None Bathroom Shower/Tub: Producer, television/film/video: Standard     Home Equipment: Pharmacologist (2 wheels)          Prior Functioning/Environment Prior Level of Function : Working/employed;Driving;History of Falls (last six months)             Mobility Comments: IND, two falls in the last two days ADLs Comments: IND        OT Problem List: Decreased activity tolerance;Impaired balance (sitting and/or standing);Decreased safety awareness;Cardiopulmonary status limiting activity;Decreased knowledge of use of DME or AE      OT Treatment/Interventions: Self-care/ADL training;Therapeutic exercise;Neuromuscular education;Energy conservation;DME and/or AE instruction;Patient/family education;Balance training;Therapeutic activities    OT Goals(Current goals can be found in the care plan section) Acute Rehab OT Goals Patient Stated Goal: to get back home OT Goal Formulation: With patient/family Time For Goal Achievement: 01/27/22 Potential to Achieve Goals: Fair  OT Frequency: Min 2X/week    Co-evaluation PT/OT/SLP Co-Evaluation/Treatment: Yes Reason for Co-Treatment: For patient/therapist safety;To address functional/ADL transfers PT goals addressed during session: Mobility/safety with mobility OT goals addressed during session: ADL's and  self-care      AM-PAC OT "6 Clicks" Daily Activity     Outcome Measure Help from another person eating meals?: Total (NPO) Help from another person taking care of personal grooming?: A Little Help from another person toileting, which  includes using toliet, bedpan, or urinal?: A Lot Help from another person bathing (including washing, rinsing, drying)?: A Lot Help from another person to put on and taking off regular upper body clothing?: A Little Help from another person to put on and taking off regular lower body clothing?: A Lot 6 Click Score: 13   End of Session Equipment Utilized During Treatment: Gait belt;Rolling walker (2 wheels) Nurse Communication: Mobility status  Activity Tolerance: Patient tolerated treatment well Patient left: in chair;with call bell/phone within reach;with family/visitor present;with chair alarm set  OT Visit Diagnosis: Unsteadiness on feet (R26.81);Other abnormalities of gait and mobility (R26.89);Pain                Time: 1020-1046 OT Time Calculation (min): 26 min Charges:  OT General Charges $OT Visit: 1 Visit OT Evaluation $OT Eval Moderate Complexity: 1 Mod  Mabry Tift OTR/L, MS Acute Rehabilitation Department Office# [phone]   Ardyth Harps 01/13/2022, 4:46 PM

## 2022-01-13 NOTE — Progress Notes (Signed)
[facility] Gastroenterology Progress Note  Leroy Bell 61 y.o. [birth year]  CC: Alcoholic cirrhosis, coffee-ground emesis   Subjective: Seen and examined at bedside.  Nursing family members in the room.  All report he is overall doing better today.  No episodes of hematemesis or melena.  Has not had a bowel movement.  Nurse planning to give 1 dose of lactulose.  Patient denies nausea, vomiting, abdominal pain.  He is alert but somnolent, able to correctly answer when asked his name, date of birth, current year, and location.  No apparent tremors.  ROS : Review of Systems  Constitutional:  Negative for chills and fever.  Respiratory:  Negative for wheezing.   Gastrointestinal:  Positive for constipation. Negative for abdominal pain, blood in stool, diarrhea, melena, nausea and vomiting.  Musculoskeletal:  Negative for falls.  Neurological:  Negative for loss of consciousness.      Objective: Vital signs in last 24 hours: Vitals:   01/13/22 0600 01/13/22 0744  BP: (!) 156/67   Pulse: 60   Resp: (!) 22   Temp:  97.8 F (36.6 C)  SpO2: 97%     Physical Exam:  General:  Somnolent, cooperative, no distress, jaundiced  Head:  Normocephalic, without obvious abnormality, atraumatic  Eyes:  Scleral icterus, EOM's intact  Lungs:   Clear to auscultation bilaterally, respirations unlabored  Heart:  Regular rate and rhythm, S1, S2 normal  Abdomen:   Distended but soft and nontender, active bowel sounds  Extremities: Extremities normal, atraumatic, no  edema  Pulses: 2+ and symmetric    Lab Results: Recent Labs    01/12/22 0259 01/13/22 0322  NA 137 137  K 4.2 4.3  CL 112* 112*  CO2 21* 20*  GLUCOSE 193* 119*  BUN 29* 19  CREATININE 1.17 0.99  CALCIUM 7.7* 7.6*  MG 2.0  --    Recent Labs    01/12/22 0259 01/13/22 0322  AST 95* 93*  ALT 56* 54*  ALKPHOS 58 61  BILITOT 7.9* 10.8*  PROT 5.1* 5.0*  ALBUMIN 2.2* 2.2*   Recent Labs    01/12/22 0259 01/13/22 0322  WBC  4.1 4.4  NEUTROABS 2.8 3.0  HGB 10.3* 10.0*  HCT 31.4* 30.6*  MCV 105.0* 104.1*  PLT 65* 66*   Recent Labs    01/12/22 1118  LABPROT 23.1*  INR 2.1*    Assessment Alcoholic cirrhosis, coffee-ground emesis - Labs 01/13/2022 show hemoglobin 10 (10.3), WBC 4.4, platelets 66, BUN 19, creatinine 0.99, AST 93, ALT 54, T. bili 10.8 (7.9) - Ammonia level 52 on 01/09/2022, repeat level pending - INR 2.1 - GFR 39 - MELD-Na: 24 at 01/13/2022   Plan: Patient appears more alert today.  Planning tentatively for EGD tomorrow or Thursday.  Will discuss further with Dr. Paulita Fujita. Continue Protonix IV 80 mg drip Continue thiamine, multivitamin, folic acid Continue octreotide IV 50 mcg/hr Continue clear liquid diet Continue anti-emetics and supportive care as needed. [facility] will follow.     Angelique Holm PA-C 01/13/2022, 9:53 AM  Contact #  [phone]

## 2022-01-14 ENCOUNTER — Encounter (HOSPITAL_COMMUNITY)
Admission: EM | Disposition: A | Discharge status: Home / Self Care | Payer: Self-pay | Source: Home / Self Care | Attending: Family Medicine

## 2022-01-14 ENCOUNTER — Inpatient Hospital Stay (HOSPITAL_COMMUNITY): Payer: Managed Care, Other (non HMO) | Admitting: Certified Registered Nurse Anesthetist

## 2022-01-14 ENCOUNTER — Encounter (HOSPITAL_COMMUNITY): Payer: Self-pay | Admitting: Gastroenterology

## 2022-01-14 ENCOUNTER — Other Ambulatory Visit: Payer: Self-pay | Admitting: Cardiology

## 2022-01-14 DIAGNOSIS — K209 Esophagitis, unspecified without bleeding: Secondary | ICD-10-CM | POA: Diagnosis not present

## 2022-01-14 DIAGNOSIS — K7030 Alcoholic cirrhosis of liver without ascites: Secondary | ICD-10-CM | POA: Diagnosis not present

## 2022-01-14 DIAGNOSIS — I10 Essential (primary) hypertension: Secondary | ICD-10-CM

## 2022-01-14 DIAGNOSIS — I8500 Esophageal varices without bleeding: Secondary | ICD-10-CM

## 2022-01-14 DIAGNOSIS — I2510 Atherosclerotic heart disease of native coronary artery without angina pectoris: Secondary | ICD-10-CM

## 2022-01-14 DIAGNOSIS — D62 Acute posthemorrhagic anemia: Secondary | ICD-10-CM | POA: Diagnosis not present

## 2022-01-14 DIAGNOSIS — D500 Iron deficiency anemia secondary to blood loss (chronic): Secondary | ICD-10-CM

## 2022-01-14 DIAGNOSIS — F17290 Nicotine dependence, other tobacco product, uncomplicated: Secondary | ICD-10-CM

## 2022-01-14 DIAGNOSIS — K766 Portal hypertension: Secondary | ICD-10-CM

## 2022-01-14 DIAGNOSIS — K3189 Other diseases of stomach and duodenum: Secondary | ICD-10-CM | POA: Diagnosis not present

## 2022-01-14 DIAGNOSIS — F1020 Alcohol dependence, uncomplicated: Secondary | ICD-10-CM | POA: Diagnosis not present

## 2022-01-14 DIAGNOSIS — F10931 Alcohol use, unspecified with withdrawal delirium: Secondary | ICD-10-CM

## 2022-01-14 DIAGNOSIS — N179 Acute kidney failure, unspecified: Secondary | ICD-10-CM

## 2022-01-14 DIAGNOSIS — I700 Atherosclerosis of aorta: Secondary | ICD-10-CM

## 2022-01-14 HISTORY — PX: ESOPHAGOGASTRODUODENOSCOPY: SHX5428

## 2022-01-14 LAB — CBC WITH DIFFERENTIAL/PLATELET
Abs Immature Granulocytes: 0.02 10*3/uL (ref 0.00–0.07)
Basophils Absolute: 0 10*3/uL (ref 0.0–0.1)
Basophils Relative: 1 %
Eosinophils Absolute: 0.1 10*3/uL (ref 0.0–0.5)
Eosinophils Relative: 3 %
HCT: 32.2 % — ABNORMAL LOW (ref 39.0–52.0)
Hemoglobin: 10.4 g/dL — ABNORMAL LOW (ref 13.0–17.0)
Immature Granulocytes: 1 %
Lymphocytes Relative: 11 %
Lymphs Abs: 0.5 10*3/uL — ABNORMAL LOW (ref 0.7–4.0)
MCH: 33.3 pg (ref 26.0–34.0)
MCHC: 32.3 g/dL (ref 30.0–36.0)
MCV: 103.2 fL — ABNORMAL HIGH (ref 80.0–100.0)
Monocytes Absolute: 0.7 10*3/uL (ref 0.1–1.0)
Monocytes Relative: 16 %
Neutro Abs: 2.9 10*3/uL (ref 1.7–7.7)
Neutrophils Relative %: 68 %
Platelets: 72 10*3/uL — ABNORMAL LOW (ref 150–400)
RBC: 3.12 MIL/uL — ABNORMAL LOW (ref 4.22–5.81)
RDW: 17 % — ABNORMAL HIGH (ref 11.5–15.5)
WBC: 4.2 10*3/uL (ref 4.0–10.5)
nRBC: 0 % (ref 0.0–0.2)

## 2022-01-14 LAB — GLUCOSE, CAPILLARY
Glucose-Capillary: 101 mg/dL — ABNORMAL HIGH (ref 70–99)
Glucose-Capillary: 101 mg/dL — ABNORMAL HIGH (ref 70–99)
Glucose-Capillary: 105 mg/dL — ABNORMAL HIGH (ref 70–99)
Glucose-Capillary: 178 mg/dL — ABNORMAL HIGH (ref 70–99)
Glucose-Capillary: 189 mg/dL — ABNORMAL HIGH (ref 70–99)

## 2022-01-14 LAB — COMPREHENSIVE METABOLIC PANEL
ALT: 54 U/L — ABNORMAL HIGH (ref 0–44)
AST: 101 U/L — ABNORMAL HIGH (ref 15–41)
Albumin: 2.2 g/dL — ABNORMAL LOW (ref 3.5–5.0)
Alkaline Phosphatase: 67 U/L (ref 38–126)
Anion gap: 6 (ref 5–15)
BUN: 16 mg/dL (ref 6–20)
CO2: 22 mmol/L (ref 22–32)
Calcium: 7.6 mg/dL — ABNORMAL LOW (ref 8.9–10.3)
Chloride: 111 mmol/L (ref 98–111)
Creatinine, Ser: 0.7 mg/dL (ref 0.61–1.24)
GFR, Estimated: >60 mL/min (ref >60)
Glucose, Bld: 105 mg/dL — ABNORMAL HIGH (ref 70–99)
Potassium: 4.3 mmol/L (ref 3.5–5.1)
Sodium: 139 mmol/L (ref 135–145)
Total Bilirubin: 14 mg/dL — ABNORMAL HIGH (ref 0.3–1.2)
Total Protein: 5.2 g/dL — ABNORMAL LOW (ref 6.5–8.1)

## 2022-01-14 SURGERY — EGD (ESOPHAGOGASTRODUODENOSCOPY)
Anesthesia: Monitor Anesthesia Care

## 2022-01-14 MED ORDER — PROPRANOLOL HCL 10 MG PO TABS
10.0000 mg | ORAL_TABLET | Freq: Two times a day (BID) | ORAL | Status: DC
  Administered 2022-01-14: 10 mg via ORAL
  Filled 2022-01-14 (×3): qty 1

## 2022-01-14 MED ORDER — SODIUM CHLORIDE 0.9 % IV SOLN: INTRAVENOUS | Status: DC

## 2022-01-14 MED ORDER — LACTATED RINGERS IV SOLN: INTRAVENOUS | Status: DC | PRN

## 2022-01-14 MED ORDER — PROPOFOL 500 MG/50ML IV EMUL
INTRAVENOUS | Status: DC | PRN
  Administered 2022-01-14: 75 ug/kg/min via INTRAVENOUS
  Administered 2022-01-14: 50 mg via INTRAVENOUS

## 2022-01-14 MED ORDER — PREDNISOLONE 5 MG PO TABS
40.0000 mg | ORAL_TABLET | Freq: Every day | ORAL | Status: DC
  Administered 2022-01-14 – 2022-01-21 (×8): 40 mg via ORAL
  Filled 2022-01-14 (×8): qty 8

## 2022-01-14 MED ORDER — LACTATED RINGERS IV SOLN: INTRAVENOUS | Status: DC

## 2022-01-14 NOTE — Interval H&P Note (Signed)
History and Physical Interval Note:  01/14/2022 11:03 AM  Leroy Bell  has presented today for surgery, with the diagnosis of Alcoholic cirrhosis, coffee ground emesis.  The various methods of treatment have been discussed with the patient and family. After consideration of risks, benefits and other options for treatment, the patient has consented to  Procedure(s): ESOPHAGOGASTRODUODENOSCOPY (EGD) (N/A) as a surgical intervention.  The patient's history has been reviewed, patient examined, no change in status, stable for surgery.  I have reviewed the patient's chart and labs.  Questions were answered to the patient's satisfaction.     Landry Dyke

## 2022-01-14 NOTE — Progress Notes (Signed)
  Progress Note   Patient: Leroy Bell [id number]:[id number] DOB: [birth year] DOA: 01/09/2022     5 DOS: the patient was seen and examined on 01/14/2022 at 8:12AM      Brief hospital course: Mr. Rothgeb is a 61 y.o. M with alcohol dependence, cirrhosis, pHTN, HTN, IDA, OSA on CPAP who presented after being found down after a few days of N/V/D and dark stools.   In the ER, seemed hemodynamically stable, admitted for GI bleed and alcohol withdrawal.  Hgb 5.5 in the ER.    9/15: Admitted on PPI, octreotide, GI consulted, BP dropped overnight, transferred to stepdown 9/17: Developing delirium 9/18: Tbili rising to 14 9/19: Mentation improving 9/20: Hopefully to endoscopy today     Assessment and Plan: * Acute renal failure (ARF) ([place]) Creatinine 2.08 on admit compared to baseline 0.7-0.8 Now resolved to baseline with transfusion, fluids  Hemorrhagic shock due to GI bleed S/p 3u PRBCs transfusion 9/16 Hgb stable today 10 - Continue Octreotide, Rocephin - Continue IV PPI - Consult GI, appreciate cares     Delirium tremens ([place]) CIWA 7-18 last 24 hours.  Still delirious overnight - Continue CIWA scoring and on demand lorazepam  Thrombocytopenia ([name]) Plts 72K, no change  Acute blood loss anemia See above  Alcoholic cirrhosis of liver without ascites ([name]) MELD 24-25 Tbili up to 14 today, INR 2.  Jaundiced.  In active alcohol use, not currrently transplant candidate. - Consult GI, appreciate cares  OSA on CPAP - CPAP   Alcohol use disorder, severe, dependence ([name]) - Continue thiamine and flate          Subjective: Still delirious overnight.  No fever.  No bleeding.       Physical Exam: BP (!) 151/64   Pulse 66   Temp 98 F (36.7 C) (Axillary)   Resp 17   Ht 5\' 7"  (1.702 m)   Wt 85.8 kg   SpO2 95%   BMI 29.63 kg/m   Overweight adult male, sleeping on CPAP, very weak and somnolent after Ativan. RRR no murmurs, no peripheral edema Respiratory rate  normal, snoring, lung sounds diminshed, no wheezing or rales audible. Abdomen soft, no ascites, no pain or griemace to palpation Sleeping, somnolent, does not rouse well due to recent Ativan, has some soft spontaneous movements of the arms.     Data Reviewed: CBC shows stable anemia, thrombocytopenia CMP shows resolved AKI, normal electrolytes, LFTs elevated but unchagned, Tbili stabilizing INR 2.1 Glucose normal   Family Communication: Wife and son at bedside    Disposition: Status is: Inpatient         Author: Edwin Dada, MD 01/14/2022 9:37 AM  For on call review [email or web address].

## 2022-01-14 NOTE — Transfer of Care (Signed)
Immediate Anesthesia Transfer of Care Note  Patient: Leroy Bell  Procedure(s) Performed: Procedure(s): ESOPHAGOGASTRODUODENOSCOPY (EGD) (N/A)  Patient Location: PACU and Endoscopy Unit  Anesthesia Type:MAC  Level of Consciousness: awake, alert  and oriented  Airway & Oxygen Therapy: Patient Spontanous Breathing and Patient connected to nasal cannula oxygen  Post-op Assessment: Report given to RN and Post -op Vital signs reviewed and stable  Post vital signs: Reviewed and stable  Last Vitals:  Vitals:   01/14/22 1052 01/14/22 1134  BP: (!) 134/53 (!) 112/44  Pulse: 64 67  Resp: 20 14  Temp: 36.6 C   SpO2: 74% 163%    Complications: No apparent anesthesia complications

## 2022-01-14 NOTE — Assessment & Plan Note (Addendum)
Resolved

## 2022-01-14 NOTE — Anesthesia Postprocedure Evaluation (Signed)
Anesthesia Post Note  Patient: Leroy Bell [facility]  Procedure(s) Performed: ESOPHAGOGASTRODUODENOSCOPY (EGD)     Patient location during evaluation: PACU Anesthesia Type: MAC Level of consciousness: awake and alert Pain management: pain level controlled Vital Signs Assessment: post-procedure vital signs reviewed and stable Respiratory status: spontaneous breathing Cardiovascular status: stable Anesthetic complications: no   No notable events documented.  Last Vitals:  Vitals:   01/14/22 1140 01/14/22 1150  BP: (!) 113/57 (!) 148/69  Pulse: 68 67  Resp: 17 17  Temp:    SpO2: 100% 97%    Last Pain:  Vitals:   01/14/22 1140  TempSrc:   PainSc: 0-No pain                 Nolon Nations

## 2022-01-14 NOTE — Assessment & Plan Note (Addendum)
Propranolol caused bradycardia when initially started post-EGD In the last 3 days, has tolerated low dose carvedilol well  - Continue carvedilol

## 2022-01-14 NOTE — Progress Notes (Signed)
Propranolol started today for varices.    This evening, patient with asymptomatic bradycardia to 30s.  Will stop propranolol now, continue close monitoring and discuss with GI in AM.

## 2022-01-14 NOTE — Assessment & Plan Note (Addendum)
Alcoholic hepatitis MELD >90, not transplant candidate due to active alcohol at admission Tbili has trended up.  Discussed with GI 9/26, as steroids have failed and there are no other disease specific therapies for his liver, we will just have to trend his Tbili and INR and albumin and hope that this improves. - Check Tbili, INR twice weekly in rehab - Follow up with GI  At 7 days, no improvement in liver indices, but will follow GI recommendations for 8 weeks prednisolone - Continue prednisolone

## 2022-01-14 NOTE — Anesthesia Preprocedure Evaluation (Addendum)
Anesthesia Evaluation  Patient identified by MRN, date of birth, ID band Patient awake    Reviewed: Allergy & Precautions, NPO status , Patient's Chart, lab work & pertinent test results  Airway Mallampati: III  TM Distance: >3 FB Neck ROM: Full    Dental  (+) Dental Advisory Given   Pulmonary shortness of breath, sleep apnea ,    Pulmonary exam normal breath sounds clear to auscultation       Cardiovascular hypertension, Pt. on home beta blockers and Pt. on medications + CAD  + Valvular Problems/Murmurs  Rhythm:Regular Rate:Normal + Systolic murmurs    Neuro/Psych negative neurological ROS     GI/Hepatic negative GI ROS, Neg liver ROS,   Endo/Other  diabetes  Renal/GU Renal disease     Musculoskeletal  (+) Arthritis ,   Abdominal (+) + obese,   Peds  Hematology  (+) Blood dyscrasia, anemia ,   Anesthesia Other Findings   Reproductive/Obstetrics                           Anesthesia Physical Anesthesia Plan  ASA: 3  Anesthesia Plan: MAC   Post-op Pain Management: Minimal or no pain anticipated   Induction: Intravenous  PONV Risk Score and Plan: 1 and Treatment may vary due to age or medical condition, Propofol infusion and TIVA  Airway Management Planned: Natural Airway  Additional Equipment:   Intra-op Plan:   Post-operative Plan:   Informed Consent: I have reviewed the patients History and Physical, chart, labs and discussed the procedure including the risks, benefits and alternatives for the proposed anesthesia with the patient or authorized representative who has indicated his/her understanding and acceptance.     Dental advisory given  Plan Discussed with: CRNA  Anesthesia Plan Comments:        Anesthesia Quick Evaluation

## 2022-01-14 NOTE — Op Note (Signed)
[facility] Patient Name: Leroy Bell Procedure Date: 01/14/2022 MRN: [id number] Attending MD: Arta Silence , MD Date of Birth: 10-29-1960 CSN: [id number] Age: 61 Admit Type: Inpatient Procedure:                Upper GI endoscopy Indications:              Iron deficiency anemia secondary to chronic blood                            loss, Iron deficiency anemia, Coffee-ground emesis Providers:                Arta Silence, MD, Jaci Carrel, RN, Luan Moore, Technician, Eliberto Ivory Referring MD:             [facility] Medicines:                Monitored Anesthesia Care Complications:            No immediate complications. Estimated Blood Loss:     Estimated blood loss: none. Procedure:                Pre-Anesthesia Assessment:                           - Prior to the procedure, a History and Physical                            was performed, and patient medications and                            allergies were reviewed. The patient's tolerance of                            previous anesthesia was also reviewed. The risks                            and benefits of the procedure and the sedation                            options and risks were discussed with the patient.                            All questions were answered, and informed consent                            was obtained. Prior Anticoagulants: The patient has                            taken no previous anticoagulant or antiplatelet                            agents. ASA Grade Assessment: III - A patient with  severe systemic disease. After reviewing the risks                            and benefits, the patient was deemed in                            satisfactory condition to undergo the procedure.                           After obtaining informed consent, the endoscope was                            passed under direct vision.  Throughout the                            procedure, the patient's blood pressure, pulse, and                            oxygen saturations were monitored continuously. The                            [id number] Olympus endoscope was introduced                            through the mouth, and advanced to the second part                            of duodenum. The upper GI endoscopy was                            accomplished without difficulty. The patient                            tolerated the procedure well. Scope In: Scope Out: Findings:      Grade I varices were found in the middle third of the esophagus. They       were small in size. Unable to be banded.      LA Grade B (one or more mucosal breaks greater than 5 mm, not extending       between the tops of two mucosal folds) esophagitis with no bleeding was       found.      The exam of the esophagus was otherwise normal.      Moderate portal hypertensive gastropathy was found in the entire       examined stomach.      The exam of the stomach was otherwise normal.      The duodenal bulb, first portion of the duodenum and second portion of       the duodenum were normal. Impression:               - Grade I esophageal varices.                           - LA Grade B esophagitis with no bleeding.                           -  Portal hypertensive gastropathy.                           - Normal duodenal bulb, first portion of the                            duodenum and second portion of the duodenum.                           - Bleeding could have been variceal (with current                            decompression by octreotide) versus reflux                            esophagitis versus portal gastropathy. Moderate Sedation:      Not Applicable - Patient had care per Anesthesia. Recommendation:           - Return patient to hospital ward for ongoing care.                           - Soft diet today.                            - Continue present medications.                           - Stop octreotide; start propranolol 10 mg po bid.                           - Continue PPI BID.                           - Progressively worsening LFTs; discriminant                            function 50; will start prednisolone 40 mg po qd.                           [name] will follow. Procedure Code(s):        --- Professional ---                           [phone], Esophagogastroduodenoscopy, flexible,                            transoral; diagnostic, including collection of                            specimen(s) by brushing or washing, when performed                            (separate procedure) Diagnosis Code(s):        --- Professional ---  I85.00, Esophageal varices without bleeding                           K20.90, Esophagitis, unspecified without bleeding                           K76.6, Portal hypertension                           K31.89, Other diseases of stomach and duodenum                           D50.0, Iron deficiency anemia secondary to blood                            loss (chronic)                           D50.9, Iron deficiency anemia, unspecified                           K92.0, Hematemesis CPT copyright 2019 American Medical Association. All rights reserved. The codes documented in this report are preliminary and upon coder review may  be revised to meet current compliance requirements. Arta Silence, MD 01/14/2022 11:37:34 AM This report has been signed electronically. Number of Addenda: 0

## 2022-01-15 DIAGNOSIS — D62 Acute posthemorrhagic anemia: Secondary | ICD-10-CM | POA: Diagnosis not present

## 2022-01-15 DIAGNOSIS — N179 Acute kidney failure, unspecified: Secondary | ICD-10-CM | POA: Diagnosis not present

## 2022-01-15 DIAGNOSIS — F1020 Alcohol dependence, uncomplicated: Secondary | ICD-10-CM | POA: Diagnosis not present

## 2022-01-15 DIAGNOSIS — K7030 Alcoholic cirrhosis of liver without ascites: Secondary | ICD-10-CM | POA: Diagnosis not present

## 2022-01-15 LAB — CBC WITH DIFFERENTIAL/PLATELET
Abs Immature Granulocytes: 0.02 10*3/uL (ref 0.00–0.07)
Basophils Absolute: 0 10*3/uL (ref 0.0–0.1)
Basophils Relative: 0 %
Eosinophils Absolute: 0 10*3/uL (ref 0.0–0.5)
Eosinophils Relative: 0 %
HCT: 35.7 % — ABNORMAL LOW (ref 39.0–52.0)
Hemoglobin: 11.6 g/dL — ABNORMAL LOW (ref 13.0–17.0)
Immature Granulocytes: 0 %
Lymphocytes Relative: 6 %
Lymphs Abs: 0.3 10*3/uL — ABNORMAL LOW (ref 0.7–4.0)
MCH: 33.4 pg (ref 26.0–34.0)
MCHC: 32.5 g/dL (ref 30.0–36.0)
MCV: 102.9 fL — ABNORMAL HIGH (ref 80.0–100.0)
Monocytes Absolute: 0.1 10*3/uL (ref 0.1–1.0)
Monocytes Relative: 3 %
Neutro Abs: 4.5 10*3/uL (ref 1.7–7.7)
Neutrophils Relative %: 91 %
Platelets: 97 10*3/uL — ABNORMAL LOW (ref 150–400)
RBC: 3.47 MIL/uL — ABNORMAL LOW (ref 4.22–5.81)
RDW: 16.6 % — ABNORMAL HIGH (ref 11.5–15.5)
WBC: 4.9 10*3/uL (ref 4.0–10.5)
nRBC: 0 % (ref 0.0–0.2)

## 2022-01-15 LAB — URINALYSIS, ROUTINE W REFLEX MICROSCOPIC
Glucose, UA: 150 mg/dL — AB
Ketones, ur: NEGATIVE mg/dL
Leukocytes,Ua: NEGATIVE
Nitrite: NEGATIVE
Protein, ur: 30 mg/dL — AB
Specific Gravity, Urine: 1.026 (ref 1.005–1.030)
pH: 5 (ref 5.0–8.0)

## 2022-01-15 LAB — COMPREHENSIVE METABOLIC PANEL
ALT: 57 U/L — ABNORMAL HIGH (ref 0–44)
AST: 100 U/L — ABNORMAL HIGH (ref 15–41)
Albumin: 2.3 g/dL — ABNORMAL LOW (ref 3.5–5.0)
Alkaline Phosphatase: 72 U/L (ref 38–126)
Anion gap: 6 (ref 5–15)
BUN: 23 mg/dL — ABNORMAL HIGH (ref 6–20)
CO2: 21 mmol/L — ABNORMAL LOW (ref 22–32)
Calcium: 7.7 mg/dL — ABNORMAL LOW (ref 8.9–10.3)
Chloride: 112 mmol/L — ABNORMAL HIGH (ref 98–111)
Creatinine, Ser: 0.84 mg/dL (ref 0.61–1.24)
GFR, Estimated: >60 mL/min (ref >60)
Glucose, Bld: 199 mg/dL — ABNORMAL HIGH (ref 70–99)
Potassium: 5 mmol/L (ref 3.5–5.1)
Sodium: 139 mmol/L (ref 135–145)
Total Bilirubin: 16 mg/dL — ABNORMAL HIGH (ref 0.3–1.2)
Total Protein: 5.5 g/dL — ABNORMAL LOW (ref 6.5–8.1)

## 2022-01-15 LAB — GLUCOSE, CAPILLARY
Glucose-Capillary: 188 mg/dL — ABNORMAL HIGH (ref 70–99)
Glucose-Capillary: 187 mg/dL — ABNORMAL HIGH (ref 70–99)
Glucose-Capillary: 220 mg/dL — ABNORMAL HIGH (ref 70–99)
Glucose-Capillary: 262 mg/dL — ABNORMAL HIGH (ref 70–99)
Glucose-Capillary: 294 mg/dL — ABNORMAL HIGH (ref 70–99)

## 2022-01-15 LAB — PROTIME-INR
INR: 2.2 — ABNORMAL HIGH (ref 0.8–1.2)
Prothrombin Time: 24.4 seconds — ABNORMAL HIGH (ref 11.4–15.2)

## 2022-01-15 MED ORDER — LACTULOSE 10 GM/15ML PO SOLN
30.0000 g | Freq: Once | ORAL | Status: AC
  Administered 2022-01-15: 30 g via ORAL
  Filled 2022-01-15: qty 45

## 2022-01-15 MED ORDER — CARVEDILOL 3.125 MG PO TABS
3.1250 mg | ORAL_TABLET | Freq: Two times a day (BID) | ORAL | Status: DC
  Administered 2022-01-17 – 2022-01-21 (×8): 3.125 mg via ORAL
  Filled 2022-01-15 (×9): qty 1

## 2022-01-15 MED ORDER — ENSURE ENLIVE PO LIQD
237.0000 mL | Freq: Two times a day (BID) | ORAL | Status: DC
  Administered 2022-01-15 – 2022-01-21 (×12): 237 mL via ORAL

## 2022-01-15 NOTE — Progress Notes (Signed)
[facility] Gastroenterology Progress Note  DOSS CYBULSKI 61 y.o. [birth year]  CC:  Alcoholic cirrhosis, coffee-ground emesis   Subjective: Patient seen and examined at bedside.  Wife in the room.  Feels okay today.  Alert and responsive to questions.  Denies nausea, vomiting, fever, chills, abdominal pain.  No bowel movement overnight.  ROS : Review of Systems  Constitutional:  Negative for chills and fever.  Gastrointestinal:  Positive for constipation. Negative for abdominal pain, blood in stool, diarrhea, melena, nausea and vomiting.      Objective: Vital signs in last 24 hours: Vitals:   01/15/22 1100 01/15/22 1200  BP: (!) 140/78   Pulse: (!) 59   Resp: 17   Temp:  97.8 F (36.6 C)  SpO2: 97%     Physical Exam:  General:  Alert, cooperative, no distress, jaundiced  Head:  Normocephalic, without obvious abnormality, atraumatic  Eyes:  Icteric sclera, EOM's intact  Lungs:   Clear to auscultation bilaterally, respirations unlabored  Heart:  Regular rate and rhythm, S1, S2 normal  Abdomen:   Soft, non-tender, distended, bowel sounds active   Extremities: Extremities normal, atraumatic, no  edema  Pulses: 2+ and symmetric    Lab Results: Recent Labs    01/14/22 0322 01/15/22 0332  NA 139 139  K 4.3 5.0  CL 111 112*  CO2 22 21*  GLUCOSE 105* 199*  BUN 16 23*  CREATININE 0.70 0.84  CALCIUM 7.6* 7.7*   Recent Labs    01/14/22 0322 01/15/22 0332  AST 101* 100*  ALT 54* 57*  ALKPHOS 67 72  BILITOT 14.0* 16.0*  PROT 5.2* 5.5*  ALBUMIN 2.2* 2.3*   Recent Labs    01/14/22 0322 01/15/22 0332  WBC 4.2 4.9  NEUTROABS 2.9 4.5  HGB 10.4* 11.6*  HCT 32.2* 35.7*  MCV 103.2* 102.9*  PLT 72* 97*   Recent Labs    01/15/22 0332  LABPROT 24.4*  INR 2.2*    Assessment Alcoholic cirrhosis, coffee-ground emesis - EGD 01/14/2021 Grade 1 esophageal varices LA grade B esophagitis with no bleeding. Portal hypertensive gastropathy Bleeding could have been  variceal (with current decompression by octreotide) versus reflux esophagitis versus portal gastropathy - Labs 01/15/2022 shows improving anemia with hemoglobin 11.6, no leukocytosis, platelets 97. - BUN 23, creatinine 0.84 - AST 100, ALT 57, alk phos 72 - Total bilirubin 16 (trending up) - Maddrey's discriminant function score of 68, 01/15/2022, poor prognosis.  Plan: On dysphagia 3 diet due to concern for aspiration.  Advance diet as appropriate. Continue PPI twice daily. Propranolol discontinued due to bradycardia. Continue prednisolone 40 mg daily.  Will reassess benefit of steroid therapy after 5 days of treatment. Continue other supportive care. [facility] will follow.  Angelique Holm PA-C 01/15/2022, 12:18 PM  Contact #  [phone]

## 2022-01-15 NOTE — Progress Notes (Signed)
  Inpatient Rehab Admissions Coordinator :  Per therapy recommendations, patient was screened for CIR candidacy by Danne Baxter RN MSN.  At this time patient appears to be a potential candidate for CIR but beds limited into next week. I will place a rehab consult per protocol for full assessment, but other rehab venues need to be also pursued due to lack of bed availability into next week.Please call me with any questions.  Danne Baxter RN MSN Admissions Coordinator [phone]

## 2022-01-15 NOTE — Progress Notes (Signed)
Speech Language Pathology Treatment: Dysphagia  Patient Details Name: Leroy Bell MRN: [id number] DOB: [birth year] Today's Date: 01/15/2022 Time: 0258-5277 SLP Time Calculation (min) (ACUTE ONLY): 12 min  Assessment / Plan / Recommendation Clinical Impression  SLP visit for skilled SLP intervention to assess tolerance of po diet s/p EGD due pt's impaired mentation/attention during initial swallow evaluation 2 days prior.  Pt today alert, sitting up in chair - weak and needing assistance to eat.  His voice and cued cough are stronger today than 2 days prior!  Pt willing to consume po intake including tea, gingerale, mashed potatoes, beef pot roast and peaches.  He is dysarthric and is not answering questions consistently accurately.  His wife stated "Dont' worry about it" when not understanding pt's statements.  Intake at lunch with this SLP was poor, likely due to both weakness and impaired focused attention to task.  Pt masticating meat and then ceases while holding bolus in his mouth without clearance requiring mashed potato administration and verbal cues to swallow to clear.  He also continues with air retention in oral cavity with occasional loss of seal- concerning for adequacy of labial seal with intake. RR up to 21 with mild accessory muscle use for breathing.   At this time, recommend continue diet with strict precautions.  Wife present and is able to help pt eat- using teach back, education ongoing.  Will follow up for dysphagia management. Anticipate as pt's medical issues resolve, improved attention and strength will improve swallow function.   HPI HPI: 61 yo male adm to [facility] with acute blood loss - concern for GI bleed - Plan is for endoscopy 01/14/2022.   He is on CIWA protocol.   PMH: DM, HLD, HTN, ETOH abuse, hepatic cirrhosis.  Pt owns a [employer].      SLP Plan  Continue with current plan of care      Recommendations for follow up therapy are one component of a  multi-disciplinary discharge planning process, led by the attending physician.  Recommendations may be updated based on patient status, additional functional criteria and insurance authorization.    Recommendations  Diet recommendations: Dysphagia 3 (mechanical soft);Thin liquid Liquids provided via: Cup;No straw;Teaspoon Medication Administration: Via alternative means Compensations: Slow rate;Small sips/bites;Other (Comment) (use puree to help clear solids into throat if needed) Postural Changes and/or Swallow Maneuvers: Upright 30-60 min after meal;Seated upright 90 degrees (stop if pt sleepy or short of breath)                Oral Care Recommendations: Oral care BID Follow Up Recommendations: No SLP follow up Assistance recommended at discharge: Frequent or constant Supervision/Assistance SLP Visit Diagnosis: Dysphagia, oropharyngeal phase (R13.12);Dysphagia, unspecified (R13.10) Plan: Continue with current plan of care         Kathleen [name], MS [place] Office [phone] Pager [phone]   Macario Golds  01/15/2022, 2:31 PM

## 2022-01-15 NOTE — Progress Notes (Signed)
  Progress Note   Patient: Leroy Bell [id number] DOB: [birth year] DOA: 01/09/2022     6 DOS: the patient was seen and examined on 01/15/2022 at 8:40AM      Brief hospital course: Leroy Bell is a 61 y.o. M with alcohol dependence, cirrhosis, pHTN, HTN, IDA, OSA on CPAP who presented after being found down after a few days of N/V/D and dark stools.   In the ER, seemed hemodynamically stable, admitted for GI bleed and alcohol withdrawal.  Hgb 5.5 in the ER.    9/15: Admitted on PPI, octreotide, GI consulted, BP dropped overnight, transferred to stepdown 9/17: Developing delirium 9/18: Tbili rising to 14 9/19: Mentation improving 9/20: EGD showed esophagitis, small varices, GAVE; octreotide stopped; bradycardia with propranol 9/21: Remains delirious at times; Rocephin stopped     Assessment and Plan: * Hemorrhagic shock due to GI bleed S/p 3u PRBCs transfusion 9/16 S/p EGD on 9/20 showed small varices, erosive esophagitis and portal gastropathy, any of which could have been source of bleeding.    Octreotide stopped post-EGD, tried BB but bradycardic.   Hgb stable today. - Stop Rocephin - Continue PO PPI - Consult GI, appreciate cares     Delirium tremens (Leroy Bell) No CIWA scoring overnight, but reportedly still intermittently disoriented. - Continue CIWA scoring and on demand lorazepam  Esophageal varices ([name]) Propranolol caused bradycardia.  D/w GI, they think BB is therapeutically important.   - Trial nadolol tomorrow while still in ICU on monitoring  Thrombocytopenia ([name]) Plts stable  Acute blood loss anemia See above  Acute renal failure (ARF) ([name]) Creatinine 2.08 on admit compared to baseline 0.7-0.8 Now resolved to baseline with transfusion, fluids  Alcoholic cirrhosis of liver without ascites ([name]) Alcoholic hepatitis MELD ~88 Tbili up again today to 16, INR slightly up to 2.2.  Jaundiced.  In active alcohol use, not currrently transplant  candidate. - Consult GI, appreciate cares - Start prednisolone, day 2   OSA on CPAP - CPAP   Alcohol use disorder, severe, dependence (Leroy Bell) - Continue thiamine and flate          Subjective: Patient is somnolent, has been delirious overnight.  No fever.  No respiratory distress, no vomiting.     Physical Exam: BP (!) 139/119   Pulse (!) 58   Temp 97.8 F (36.6 C) (Oral)   Resp (!) 21   Ht 5\' 7"  (1.702 m)   Wt 85.8 kg   SpO2 97%   BMI 29.63 kg/m   Adult male, lying in bed, CPAP in place, sleeping Stirs sluggishly based on open eyes and does not follow commands Slow, regular, no murmurs, no peripheral edema Respiratory rate normal, lungs clear without rales or wheezes Abdomen soft, no ascites or distention   Data Reviewed: Discussed with Dr. Paulita Fujita Sodium and potassium normal and LFTs with no significant change, mildly elevated INR slightly up to 2.2 Hemogram shows platelets 97,000, relative stable  Family Communication: Wife at the bedside    Disposition: Status is: Inpatient         Author: Edwin Dada, MD 01/15/2022 1:31 PM  For on call review [email or web address].

## 2022-01-15 NOTE — Progress Notes (Signed)
Patient having expiratory wheezing, no distress or difficulty breathing noted. Dr. Loleta Books notified, no new orders at this time, continue to monitor the patient and he will evaluate on morning rounds.

## 2022-01-15 NOTE — Progress Notes (Signed)
Occupational Therapy Treatment Patient Details Name: Leroy Bell MRN: [id number] DOB: 11/25/1960 Today's Date: 01/15/2022   History of present illness Pt is a 61 yo male presenting to [facility] ED on 01/09/22 with complaints of nausea & vomiting, diarrhea found down. PMH: DM, HLD, HTN, ETOH abuse, hepatic cirrhosis.   OT comments  Patient able to ambulate to sink today with min assist and RW. He continues to exhibit impair cognition and needed multimodal cues to use walker, transition safely to standing at sink and for grooming task. He has functional upper extremity strength but decreased coordination and has a tendency to lean his head down to reach his face. He was able to ambulate in hall with PT. Patient progressing well but continues to have multiple deficits including impaired cognition, poor activity tolerance, and decreased balance. 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 Due to patient's deficits and high prior level recommend aggressive short term rehab at discharge.    Recommendations for follow up therapy are one component of a multi-disciplinary discharge planning process, led by the attending physician.  Recommendations may be updated based on patient status, additional functional criteria and insurance authorization.    Follow Up Recommendations  Acute inpatient rehab (3hours/day)    Assistance Recommended at Discharge Frequent or constant Supervision/Assistance  Patient can return home with the following  A lot of help with bathing/dressing/bathroom;Direct supervision/assist for medications management;Direct supervision/assist for financial management;Assist for transportation;Assistance with cooking/housework;Help with stairs or ramp for entrance;A little help with walking and/or transfers   Equipment Recommendations  Other (comment) (TBD)    Recommendations for Other Services Rehab consult    Precautions / Restrictions  Precautions Precautions: Fall Precaution Comments: two recent falls pta Restrictions Weight Bearing Restrictions: No       Mobility Bed Mobility                    Transfers                         Balance Overall balance assessment: Needs assistance Sitting-balance support: No upper extremity supported, Feet supported Sitting balance-[name]: Fair     Standing balance support: During functional activity, Single extremity supported Standing balance-[name]: Poor                             ADL either performed or assessed with clinical judgement   ADL Overall ADL's : Needs assistance/impaired Eating/Feeding: Set up   Grooming: Standing;Wash/dry face Grooming Details (indicate cue type and reason): Patient ambulated to sink with min assist and walker to work on standing grooming task. When asked to wash his face patient began wiping the counter off. Eventually bringing the cloth towards his face. xhibited difficulty reaching his face so brough his head down towards his hand. Once again began wiping the counter.                             Functional mobility during ADLs: Rolling walker (2 wheels);Minimal assistance General ADL Comments: Min assist with walker to walk around bed toward sink with verbal cues on how to use walker. Increased time and verbal and tactile cues to get him to transition to standing safely at sink.    Extremity/Trunk Assessment Upper Extremity Assessment Upper Extremity Assessment: RUE deficits/detail;LUE deficits/detail RUE Deficits / Details: WFL ROM, grossly 4/5  shoulder and otherwise 5/5 strength RUE Coordination: decreased fine motor;decreased gross motor LUE Deficits / Details: WFL ROM, grossly 4/5 shoulder and otherwise 5/5 strength LUE Coordination: decreased fine motor;decreased gross motor   Lower Extremity Assessment Lower Extremity Assessment: Defer to PT evaluation   Cervical / Trunk  Assessment Cervical / Trunk Assessment: Normal    Vision   Vision Assessment?: No apparent visual deficits   Perception     Praxis      Cognition Arousal/Alertness: Awake/alert Behavior During Therapy: WFL for tasks assessed/performed Overall Cognitive Status: Impaired/Different from baseline Area of Impairment: Following commands, Awareness, Problem solving, Safety/judgement, Attention                   Current Attention Level: Selective   Following Commands: Follows one step commands with increased time, Follows one step commands consistently, Follows multi-step commands inconsistently, Follows multi-step commands with increased time Safety/Judgement: Decreased awareness of safety, Decreased awareness of deficits Awareness: Emergent Problem Solving: Slow processing, Decreased initiation, Difficulty sequencing, Requires verbal cues, Requires tactile cues General Comments: Pt AOx4 with slow responses, wife reports pt is at baseline just responding slower than normal. Pt able to follow single and multistep commands consistently with increased time and multimodal cuing. pt demonstrates some impaired sequencing and delayed processing. sense of humor is intact.        Exercises      Shoulder Instructions       General Comments      Pertinent Vitals/ Pain       Pain Assessment Pain Assessment: No/denies pain  Home Living                                          Prior Functioning/Environment              Frequency  Min 2X/week        Progress Toward Goals  OT Goals(current goals can now be found in the care plan section)  Progress towards OT goals: Progressing toward goals  Acute Rehab OT Goals Patient Stated Goal: to walk outside OT Goal Formulation: With patient Time For Goal Achievement: 01/27/22 Potential to Achieve Goals: Good  Plan Discharge plan needs to be updated    Co-evaluation    PT/OT/SLP Co-Evaluation/Treatment:  Yes Reason for Co-Treatment: To address functional/ADL transfers;For patient/therapist safety PT goals addressed during session: Mobility/safety with mobility OT goals addressed during session: ADL's and self-care      AM-PAC OT "6 Clicks" Daily Activity     Outcome Measure   Help from another person eating meals?: A Little Help from another person taking care of personal grooming?: A Little Help from another person toileting, which includes using toliet, bedpan, or urinal?: A Lot Help from another person bathing (including washing, rinsing, drying)?: A Lot Help from another person to put on and taking off regular upper body clothing?: A Lot Help from another person to put on and taking off regular lower body clothing?: A Lot 6 Click Score: 14    End of Session Equipment Utilized During Treatment: Gait belt;Rolling walker (2 wheels)  OT Visit Diagnosis: Unsteadiness on feet (R26.81);Other abnormalities of gait and mobility (R26.89);Pain   Activity Tolerance Patient tolerated treatment well   Patient Left in chair;with call bell/phone within reach;with family/visitor present;with chair alarm set   Nurse Communication Mobility status        Time: [phone] OT  Time Calculation (min): 41 min  Charges: OT General Charges $OT Visit: 1 Visit OT Treatments $Self Care/Home Management : 8-22 mins  Donnella Sham, OTR/L Acute Care Rehab Services  Office [phone]   Kelli Churn 01/15/2022, 3:37 PM

## 2022-01-15 NOTE — Progress Notes (Signed)
Physical Therapy Treatment Patient Details Name: Leroy Bell MRN: [id number] DOB: 1960/10/26 Today's Date: 01/15/2022   History of Present Illness Pt is a 61yo male presenting to [facility] ED on 01/09/22 with complaints of nausea & vomiting, diarrhea found down. PMH: DM, HLD, HTN, ETOH abuse, hepatic cirrhosis    PT Comments    Patient progressing well and highly motivated to participate in therapy. Initiated gait training today with RW and HHA. Pt required Mod assist to manage walker and steady balance and Min-Mod assist with 1-2HHA for gait. Pt tended to have anterior trunk lean with gait and short shuffled steps requiring repeated cuing to facilitate symmetrical gait pattern and manual cues/assist to facilitate upright posture. VSS with HR in 70-90's and Spo2 94% or better on RA. 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 He has excellent support at home and will benefit from intense therapy at AIR setting prior to return home to regain independence and reduce caregiver burden. Will progress as able in acute setting.    Recommendations for follow up therapy are one component of a multi-disciplinary discharge planning process, led by the attending physician.  Recommendations may be updated based on patient status, additional functional criteria and insurance authorization.  Follow Up Recommendations  Acute inpatient rehab (3hours/day)     Assistance Recommended at Discharge Intermittent Supervision/Assistance  Patient can return home with the following A little help with walking and/or transfers;A little help with bathing/dressing/bathroom;Assistance with cooking/housework;Assist for transportation;Help with stairs or ramp for entrance   Equipment Recommendations  Rolling walker (2 wheels)    Recommendations for Other Services       Precautions / Restrictions Precautions Precautions: Fall Precaution Comments: two recent falls pta Restrictions Weight Bearing Restrictions:  No     Mobility  Bed Mobility Overal bed mobility: Needs Assistance Bed Mobility: Supine to Sit     Supine to sit: Min guard, HOB elevated     General bed mobility comments: Patient able to initiate transfer to EOB. guarding to pivot and scoot to edge.    Transfers Overall transfer level: Needs assistance Equipment used: Rolling walker (2 wheels) Transfers: Sit to/from Stand Sit to Stand: Min assist           General transfer comment: cues for safe hand placement with RW. pt able to power up but assist needed to steady rise.    Ambulation/Gait Ambulation/Gait assistance: Min assist, Mod assist Gait Distance (Feet): 200 Feet Assistive device: Rolling walker (2 wheels), 1 person hand held assist, 2 person hand held assist Gait Pattern/deviations: Step-through pattern, Decreased step length - right, Decreased step length - left, Decreased stride length, Shuffle, Trunk flexed, Narrow base of support, Staggering left, Staggering right Gait velocity: decr     General Gait Details: pt with short shuffled steps, drifting Rt/Lt with poor control over walker at start. 1HHA provided and step length improved. 2HHA provided and pt more stable with good step length and smooth pattern ~25-50% of gait. HR and SpO2 stable throughout. no LOB noted.   Stairs             Wheelchair Mobility    Modified Rankin (Stroke Patients Only)       Balance Overall balance assessment: Needs assistance Sitting-balance support: Feet supported, No upper extremity supported Sitting balance-[name]: Good     Standing balance support: Reliant on assistive device for balance, During functional activity, Bilateral upper extremity supported Standing balance-[name]: Poor  Cognition Arousal/Alertness: Awake/alert Behavior During Therapy: WFL for tasks assessed/performed Overall Cognitive Status: Impaired/Different from baseline Area of  Impairment: Following commands, Awareness, Problem solving, Safety/judgement, Attention                   Current Attention Level: Selective   Following Commands: Follows one step commands with increased time, Follows one step commands consistently, Follows multi-step commands inconsistently, Follows multi-step commands with increased time Safety/Judgement: Decreased awareness of safety, Decreased awareness of deficits Awareness: Emergent Problem Solving: Slow processing, Decreased initiation, Difficulty sequencing, Requires verbal cues, Requires tactile cues General Comments: Pt AOx4 with slow responses, wife reports pt is at baseline just responding slower than normal. Pt able to follow single and multistep commands consistently with increased time and multimodal cuing. pt demonstrates some impaired sequencing and delayed processing. sense of humor is intact.        Exercises      General Comments        Pertinent Vitals/Pain Pain Assessment Pain Assessment: No/denies pain    Home Living                          Prior Function            PT Goals (current goals can now be found in the care plan section) Acute Rehab PT Goals Patient Stated Goal: To go home PT Goal Formulation: With patient Time For Goal Achievement: 01/27/22 Potential to Achieve Goals: Good Progress towards PT goals: Progressing toward goals    Frequency    Min 3X/week      PT Plan Current plan remains appropriate    Co-evaluation PT/OT/SLP Co-Evaluation/Treatment: Yes Reason for Co-Treatment: To address functional/ADL transfers;For patient/therapist safety PT goals addressed during session: Balance;Mobility/safety with mobility;Proper use of DME OT goals addressed during session: Strengthening/ROM;ADL's and self-care      AM-PAC PT "6 Clicks" Mobility   Outcome Measure  Help needed turning from your back to your side while in a flat bed without using bedrails?: A  Little Help needed moving from lying on your back to sitting on the side of a flat bed without using bedrails?: A Little Help needed moving to and from a bed to a chair (including a wheelchair)?: A Little Help needed standing up from a chair using your arms (e.g., wheelchair or bedside chair)?: A Little Help needed to walk in hospital room?: A Little Help needed climbing 3-5 steps with a railing? : A Little 6 Click Score: 18    End of Session Equipment Utilized During Treatment: Gait belt Activity Tolerance: No increased pain;Patient tolerated treatment well Patient left: in chair;with call bell/phone within reach;with chair alarm set;with family/visitor present Nurse Communication: Mobility status PT Visit Diagnosis: History of falling (Z91.81);Difficulty in walking, not elsewhere classified (R26.2);Unsteadiness on feet (R26.81)     Time: 1001-1037 PT Time Calculation (min) (ACUTE ONLY): 36 min  Charges:  $Gait Training: 8-22 mins                     Verner Mould, DPT Acute Rehabilitation Services Office [phone] Pager [phone]  01/15/22 2:48 PM

## 2022-01-16 DIAGNOSIS — R578 Other shock: Secondary | ICD-10-CM | POA: Diagnosis not present

## 2022-01-16 DIAGNOSIS — E119 Type 2 diabetes mellitus without complications: Secondary | ICD-10-CM

## 2022-01-16 LAB — GLUCOSE, CAPILLARY
Glucose-Capillary: 249 mg/dL — ABNORMAL HIGH (ref 70–99)
Glucose-Capillary: 279 mg/dL — ABNORMAL HIGH (ref 70–99)
Glucose-Capillary: 188 mg/dL — ABNORMAL HIGH (ref 70–99)
Glucose-Capillary: 219 mg/dL — ABNORMAL HIGH (ref 70–99)
Glucose-Capillary: 273 mg/dL — ABNORMAL HIGH (ref 70–99)
Glucose-Capillary: 242 mg/dL — ABNORMAL HIGH (ref 70–99)
Glucose-Capillary: 196 mg/dL — ABNORMAL HIGH (ref 70–99)

## 2022-01-16 LAB — COMPREHENSIVE METABOLIC PANEL
ALT: 64 U/L — ABNORMAL HIGH (ref 0–44)
AST: 105 U/L — ABNORMAL HIGH (ref 15–41)
Albumin: 2.3 g/dL — ABNORMAL LOW (ref 3.5–5.0)
Alkaline Phosphatase: 77 U/L (ref 38–126)
Anion gap: 5 (ref 5–15)
BUN: 37 mg/dL — ABNORMAL HIGH (ref 6–20)
CO2: 21 mmol/L — ABNORMAL LOW (ref 22–32)
Calcium: 7.9 mg/dL — ABNORMAL LOW (ref 8.9–10.3)
Chloride: 112 mmol/L — ABNORMAL HIGH (ref 98–111)
Creatinine, Ser: 1.3 mg/dL — ABNORMAL HIGH (ref 0.61–1.24)
GFR, Estimated: >60 mL/min (ref >60)
Glucose, Bld: 279 mg/dL — ABNORMAL HIGH (ref 70–99)
Potassium: 5.1 mmol/L (ref 3.5–5.1)
Sodium: 138 mmol/L (ref 135–145)
Total Bilirubin: 15.8 mg/dL — ABNORMAL HIGH (ref 0.3–1.2)
Total Protein: 5.5 g/dL — ABNORMAL LOW (ref 6.5–8.1)

## 2022-01-16 LAB — CBC WITH DIFFERENTIAL/PLATELET
Abs Immature Granulocytes: 0.04 10*3/uL (ref 0.00–0.07)
Basophils Absolute: 0 10*3/uL (ref 0.0–0.1)
Basophils Relative: 0 %
Eosinophils Absolute: 0 10*3/uL (ref 0.0–0.5)
Eosinophils Relative: 0 %
HCT: 33.2 % — ABNORMAL LOW (ref 39.0–52.0)
Hemoglobin: 10.9 g/dL — ABNORMAL LOW (ref 13.0–17.0)
Immature Granulocytes: 1 %
Lymphocytes Relative: 5 %
Lymphs Abs: 0.4 10*3/uL — ABNORMAL LOW (ref 0.7–4.0)
MCH: 34 pg (ref 26.0–34.0)
MCHC: 32.8 g/dL (ref 30.0–36.0)
MCV: 103.4 fL — ABNORMAL HIGH (ref 80.0–100.0)
Monocytes Absolute: 0.6 10*3/uL (ref 0.1–1.0)
Monocytes Relative: 8 %
Neutro Abs: 6.9 10*3/uL (ref 1.7–7.7)
Neutrophils Relative %: 86 %
Platelets: 91 10*3/uL — ABNORMAL LOW (ref 150–400)
RBC: 3.21 MIL/uL — ABNORMAL LOW (ref 4.22–5.81)
RDW: 17.1 % — ABNORMAL HIGH (ref 11.5–15.5)
WBC: 8 10*3/uL (ref 4.0–10.5)
nRBC: 0 % (ref 0.0–0.2)

## 2022-01-16 LAB — PROTIME-INR
INR: 2.2 — ABNORMAL HIGH (ref 0.8–1.2)
Prothrombin Time: 23.8 seconds — ABNORMAL HIGH (ref 11.4–15.2)

## 2022-01-16 MED ORDER — PANTOPRAZOLE SODIUM 40 MG PO TBEC
40.0000 mg | DELAYED_RELEASE_TABLET | Freq: Two times a day (BID) | ORAL | Status: DC
  Administered 2022-01-16: 40 mg via ORAL
  Filled 2022-01-16: qty 1

## 2022-01-16 MED ORDER — INSULIN ASPART 100 UNIT/ML IJ SOLN
0.0000 [IU] | Freq: Every day | INTRAMUSCULAR | Status: DC
  Administered 2022-01-17 – 2022-01-19 (×3): 3 [IU] via SUBCUTANEOUS

## 2022-01-16 MED ORDER — LACTATED RINGERS IV SOLN: INTRAVENOUS | Status: DC

## 2022-01-16 MED ORDER — HYDRALAZINE HCL 20 MG/ML IJ SOLN
10.0000 mg | Freq: Four times a day (QID) | INTRAMUSCULAR | Status: DC | PRN
  Administered 2022-01-16: 10 mg via INTRAVENOUS
  Filled 2022-01-16: qty 1

## 2022-01-16 MED ORDER — PANTOPRAZOLE SODIUM 40 MG IV SOLR
40.0000 mg | Freq: Two times a day (BID) | INTRAVENOUS | Status: DC
  Administered 2022-01-17 – 2022-01-19 (×6): 40 mg via INTRAVENOUS
  Filled 2022-01-16 (×6): qty 10

## 2022-01-16 MED ORDER — INSULIN ASPART 100 UNIT/ML IJ SOLN
0.0000 [IU] | Freq: Three times a day (TID) | INTRAMUSCULAR | Status: DC
  Administered 2022-01-16: 8 [IU] via SUBCUTANEOUS
  Administered 2022-01-16: 5 [IU] via SUBCUTANEOUS
  Administered 2022-01-17 (×3): 3 [IU] via SUBCUTANEOUS
  Administered 2022-01-18 (×2): 5 [IU] via SUBCUTANEOUS
  Administered 2022-01-18: 8 [IU] via SUBCUTANEOUS
  Administered 2022-01-19: 5 [IU] via SUBCUTANEOUS
  Administered 2022-01-19: 3 [IU] via SUBCUTANEOUS
  Administered 2022-01-19: 2 [IU] via SUBCUTANEOUS
  Administered 2022-01-20: 3 [IU] via SUBCUTANEOUS
  Administered 2022-01-20: 5 [IU] via SUBCUTANEOUS
  Administered 2022-01-20: 3 [IU] via SUBCUTANEOUS
  Administered 2022-01-21: 5 [IU] via SUBCUTANEOUS

## 2022-01-16 NOTE — TOC Progression Note (Signed)
Transition of Care [facility]) - Progression Note    Patient Details  Name: Leroy Bell MRN: [id number] Date of Birth: Jan 04, 1961  Transition of Care [facility]) CM/SW [place], RN Phone Number: 01/16/2022, 1:31 PM  Clinical Narrative:   Received call from CM: Manuela Schwartz with [facility] [phone] ext [phone], request call if any outpatient needs at discharge.  TOC will continue to follow for discharge needs.      Expected Discharge Plan: Home/Self Care Barriers to Discharge: Continued Medical Work up  Expected Discharge Plan and Services Expected Discharge Plan: Home/Self Care       Living arrangements for the past 2 months: Single Family Home                                       Social Determinants of Health (SDOH) Interventions    Readmission Risk Interventions     No data to display

## 2022-01-16 NOTE — Assessment & Plan Note (Addendum)
A1c 6.7% in the past, not on meds at home.  Here, hyperglycemia noted with prednisolone - Continue Semglee at discharge - Continue SS corrections

## 2022-01-16 NOTE — Progress Notes (Signed)
Subjective:  Groggy and somnolent over past 24 hours.  Objective: Vital signs in last 24 hours: Temp:  [97.5 F (36.4 C)-97.9 F (36.6 C)] 97.9 F (36.6 C) (09/22 1100) Pulse Rate:  [43-63] 52 (09/22 1100) Resp:  [11-27] 24 (09/22 1300) BP: (112-159)/(63-84) 112/65 (09/22 1300) SpO2:  [92 %-98 %] 97 % (09/22 1100) Weight:  [83.4 kg] 83.4 kg (09/22 0500) Weight change:  Last BM Date : 01/15/22  PE: GEN:  Jaundiced, scleral icterus.  Somnolent but arousable HEENT:  Icteric sclera ABD:  Mild protuberant, soft SKIN:  Scattered ecchymoses  Lab Results: CBC    Component Value Date/Time   WBC 8.0 01/16/2022 0221   RBC 3.21 (L) 01/16/2022 0221   HGB 10.9 (L) 01/16/2022 0221   HGB 12.8 (L) 08/26/2021 0818   HGB 12.8 (L) 10/05/2018 0923   HGB 12.8 (L) 10/05/2018 0923   HCT 33.2 (L) 01/16/2022 0221   HCT 35.8 (L) 10/05/2018 0923   HCT 35.8 (L) 10/05/2018 0923   PLT 91 (L) 01/16/2022 0221   PLT 70 (L) 08/26/2021 0818   PLT 138 (L) 10/05/2018 0923   PLT 138 (L) 10/05/2018 0923   MCV 103.4 (H) 01/16/2022 0221   MCV 95 10/05/2018 0923   MCV 95 10/05/2018 0923   MCH 34.0 01/16/2022 0221   MCHC 32.8 01/16/2022 0221   RDW 17.1 (H) 01/16/2022 0221   RDW 12.0 10/05/2018 0923   RDW 12.0 10/05/2018 0923   LYMPHSABS 0.4 (L) 01/16/2022 0221   LYMPHSABS 1.1 10/05/2018 0923   LYMPHSABS 1.1 10/05/2018 0923   MONOABS 0.6 01/16/2022 0221   EOSABS 0.0 01/16/2022 0221   EOSABS 0.3 10/05/2018 0923   EOSABS 0.3 10/05/2018 0923   BASOSABS 0.0 01/16/2022 0221   BASOSABS 0.0 10/05/2018 0923   BASOSABS 0.0 10/05/2018 0923   CMP     Component Value Date/Time   NA 138 01/16/2022 0221   NA 136 10/05/2018 0923   K 5.1 01/16/2022 0221   CL 112 (H) 01/16/2022 0221   CO2 21 (L) 01/16/2022 0221   GLUCOSE 279 (H) 01/16/2022 0221   BUN 37 (H) 01/16/2022 0221   BUN 6 10/05/2018 0923   CREATININE 1.30 (H) 01/16/2022 0221   CREATININE 0.77 09/09/2020 0837   CALCIUM 7.9 (L) 01/16/2022 0221    PROT 5.5 (L) 01/16/2022 0221   PROT 6.7 10/05/2018 0923   ALBUMIN 2.3 (L) 01/16/2022 0221   ALBUMIN 3.8 10/05/2018 0923   AST 105 (H) 01/16/2022 0221   AST 84 (H) 09/09/2020 0837   ALT 64 (H) 01/16/2022 0221   ALT 32 09/09/2020 0837   ALKPHOS 77 01/16/2022 0221   BILITOT 15.8 (H) 01/16/2022 0221   BILITOT 1.4 (H) 09/09/2020 0837   GFRNONAA >60 01/16/2022 0221   GFRNONAA >60 09/09/2020 0837   GFRAA 115 10/05/2018 0923   Assessment:   Elevated LFTs. Alcohol abuse. Cirrhosis.  Coffee ground emesis, resolved. Altered mental status.  Plan:  Continue prednisolone and reassess LFTs after 7 days of treatment (Lille score) whether or not to continue steroids. Hopefully can tolerated try low dose carvedilol. Agree with lactulose; if able to tolerate PO, would consider rifaximin (Xifaxan) 550 mg po bid as well. Continue PPI. Diet as mental status permits. Patient will need to remain in hospital for the next few days, following mental status and LFT trend (unfortunately not liver transplant candidate because he's been counseled in past about importance of alcohol cessation). [facility] will follow.   Landry Dyke 01/16/2022, 1:50 PM  Cell [phone] If no answer or after 5 PM call [phone]

## 2022-01-16 NOTE — Progress Notes (Signed)
  Progress Note   Patient: Leroy Bell JJH:[id number] DOB: [birth year] DOA: 01/09/2022     7 DOS: the patient was seen and examined on 01/16/2022 at at 8:42AM      Brief hospital course: Leroy Bell is a 61 y.o. M with alcohol dependence, cirrhosis, pHTN, HTN, IDA, OSA on CPAP who was admitted for alcohol delirium and GI bleed.     Assessment and Plan: * Hemorrhagic shock due to GI bleed Acute blood loss anemia Resolved - Continue PO PPI - Consult GI, appreciate cares     Delirium tremens ([place]) Remains disoriented.   - Continue PRN lorazepam - Continueh taimine and foalte  Controlled type 2 diabetes mellitus without complication, without long-term current use of insulin ([name]) Glucose elevated - ContinueS S corrections, increase dose   Esophageal varices ([name]) Still bradycardic tot he 40s prior to carvedilol this morning, so held - Trial carvedilol if HR improves  Thrombocytopenia ([name]) Slightly up    Acute renal failure (ARF) ([name]) Creatinine 2.08 on admit compared to baseline 0.7-0.8, resolved with fluids.  Back up to 1.3 today. - Resume fluids   Alcoholic cirrhosis of liver without ascites ([name]) Alcoholic hepatitis - Consult GI, appreciate cares - Continue prednisolone, day 3  OSA on CPAP - CPAP              Subjective: Patient remains somnolent, oriented only to self today, does not recognize family.  No fever, cough, respiratory distress, vomiting.  No further bowel movements or melena.        Physical Exam: BP (!) 142/66   Pulse (!) 49   Temp 97.9 F (36.6 C) (Oral)   Resp 14   Ht 5\' 7"  (1.702 m)   Wt 83.4 kg   SpO2 98%   BMI 28.80 kg/m   Overweight adult male, lying in bed, sluggish, jaundiced Heart rate slow, regular, no murmurs, no significant peripheral edema at the bedside Respiratory rate normal, lungs clear without rales or wheezes Abdomen soft nondistended or significant ascites Attention diminished, affect blunted,  oriented to self, otherwise not oriented to place, family, situation.  Follows commands intermittently.      Data Reviewed: Patient metabolic panel shows creatinine of 1.3, LFTs no change Total bilirubin 15.8, no change Hemoglobin 10, platelets 91, no change Hemoglobin A1c 6.7% Glucose elevated  Family Communication: Son and daughter at the bedside    Disposition: Status is: Inpatient         Author: Edwin Dada, MD 01/16/2022 1:36 PM  For on call review [email or web address].

## 2022-01-16 NOTE — Progress Notes (Signed)
PHARMACIST - PHYSICIAN COMMUNICATION  DR:   Loleta Books  CONCERNING: IV to Oral Route Change Policy  RECOMMENDATION: This patient is receiving pantoprazole by the intravenous route.  Based on criteria approved by the Pharmacy and Therapeutics Committee, the intravenous medication(s) is/are being converted to the equivalent oral dose form(s).   DESCRIPTION: These criteria include: The patient is eating (either orally or via tube) and/or has been taking other orally administered medications for a least 24 hours The patient has no evidence of active gastrointestinal bleeding or impaired GI absorption (gastrectomy, short bowel, patient on TNA or NPO).  If you have questions about this conversion, please contact the Pharmacy Department  []   [phone] )  [facility] []   [phone] )  [facility] []   [phone] )  [facility] []   [phone] )  [facility] [x]   [phone] )  [facility] 01/16/2022 8:26 AM

## 2022-01-16 NOTE — Progress Notes (Signed)
Physical Therapy Treatment Patient Details Name: Leroy Bell MRN: [id number] DOB: [birth year] Today's Date: 01/16/2022   History of Present Illness Pt is a 61 yo male presenting to [facility] ED on 01/09/22 with complaints of nausea & vomiting, diarrhea found down. PMH: DM, HLD, HTN, ETOH abuse, hepatic cirrhosis.    PT Comments    General Comments: AxO x 2 intermittent confusion to current situation/time/place requiring repeat instructions.  Impaired safety cognition.  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 Pt in bed trying to get up to "go to bathroom".  Re oriented to [employer].  Assisted OOB to amb was difficult.  General bed mobility comments: Patient able to initiate transfer to EOB. guarding to pivot and scoot to edge. Delayed motor control.  Mild tremors. General transfer comment: required increased assist this session present with severe posterior lean and mild tremors. General Gait Details: Very unsteady gait requiring + 2 hand held assist as pt was too uncoordinated to use a walker drifting left.  Very short shuffled steps and posteriior lean.  HIGH FALL RISK. Positioned in recliner to comfort.   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  Pt would benefit from Inpt Rehab prior to returning home.   Recommendations for follow up therapy are one component of a multi-disciplinary discharge planning process, led by the attending physician.  Recommendations may be updated based on patient status, additional functional criteria and insurance authorization.  Follow Up Recommendations  Acute inpatient rehab (3hours/day)     Assistance Recommended at Discharge Intermittent Supervision/Assistance  Patient can return home with the following A little help with walking and/or transfers;A little help with bathing/dressing/bathroom;Assistance with cooking/housework;Assist for transportation;Help with stairs or ramp for entrance   Equipment Recommendations  Rolling  walker (2 wheels)    Recommendations for Other Services       Precautions / Restrictions Precautions Precautions: Fall Precaution Comments: ETOH ataxia     Mobility  Bed Mobility Overal bed mobility: Needs Assistance Bed Mobility: Supine to Sit     Supine to sit: Min guard, Min assist     General bed mobility comments: Patient able to initiate transfer to EOB. guarding to pivot and scoot to edge. Delayed motor control.  Mild tremors.    Transfers Overall transfer level: Needs assistance Equipment used: Rolling walker (2 wheels), None Transfers: Sit to/from Stand Sit to Stand: Mod assist, +2 physical assistance, +2 safety/equipment           General transfer comment: required increased assist this session present with severe posterior lean and mild tremors.    Ambulation/Gait Ambulation/Gait assistance: Min assist, Mod assist, +2 physical assistance, +2 safety/equipment Gait Distance (Feet): 225 Feet Assistive device: Rolling walker (2 wheels), 1 person hand held assist, 2 person hand held assist Gait Pattern/deviations: Step-through pattern, Decreased step length - right, Decreased step length - left, Decreased stride length, Shuffle, Trunk flexed, Narrow base of support, Staggering left, Staggering right Gait velocity: decr     General Gait Details: Very unsteady gait requiring + 2 hand held assist as pt was too uncoordinated to use a walker drifting left.  Very short shuffled steps and posteriior lean.  HIGH FALL RISK.   Stairs             Wheelchair Mobility    Modified Rankin (Stroke Patients Only)       Balance  Cognition Arousal/Alertness: Awake/alert Behavior During Therapy: Restless Overall Cognitive Status: Impaired/Different from baseline                                 General Comments: AxO x 2 intermittent confusion to current situation/time/place requiring  repeat instructions.  Impaired safety cognition.  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        Exercises      General Comments        Pertinent Vitals/Pain Pain Assessment Pain Assessment: No/denies pain    Home Living                          Prior Function            PT Goals (current goals can now be found in the care plan section) Progress towards PT goals: Progressing toward goals    Frequency    Min 3X/week      PT Plan Current plan remains appropriate    Co-evaluation              AM-PAC PT "6 Clicks" Mobility   Outcome Measure  Help needed turning from your back to your side while in a flat bed without using bedrails?: A Lot Help needed moving from lying on your back to sitting on the side of a flat bed without using bedrails?: A Lot Help needed moving to and from a bed to a chair (including a wheelchair)?: A Lot Help needed standing up from a chair using your arms (e.g., wheelchair or bedside chair)?: A Lot Help needed to walk in hospital room?: A Lot Help needed climbing 3-5 steps with a railing? : A Lot 6 Click Score: 12    End of Session Equipment Utilized During Treatment: Gait belt Activity Tolerance: No increased pain;Patient tolerated treatment well Patient left: in chair;with call bell/phone within reach;with chair alarm set;with family/visitor present Nurse Communication: Mobility status PT Visit Diagnosis: History of falling (Z91.81);Difficulty in walking, not elsewhere classified (R26.2);Unsteadiness on feet (R26.81)     Time: 1125-1150 PT Time Calculation (min) (ACUTE ONLY): 25 min  Charges:  $Gait Training: 8-22 mins $Therapeutic Activity: 8-22 mins                     Rica Koyanagi  PTA Acute  Rehabilitation Services Office M-F          [phone] Weekend pager [phone]

## 2022-01-17 DIAGNOSIS — R578 Other shock: Secondary | ICD-10-CM | POA: Diagnosis not present

## 2022-01-17 LAB — GLUCOSE, CAPILLARY
Glucose-Capillary: 159 mg/dL — ABNORMAL HIGH (ref 70–99)
Glucose-Capillary: 181 mg/dL — ABNORMAL HIGH (ref 70–99)
Glucose-Capillary: 248 mg/dL — ABNORMAL HIGH (ref 70–99)
Glucose-Capillary: 294 mg/dL — ABNORMAL HIGH (ref 70–99)

## 2022-01-17 LAB — COMPREHENSIVE METABOLIC PANEL
ALT: 72 U/L — ABNORMAL HIGH (ref 0–44)
AST: 103 U/L — ABNORMAL HIGH (ref 15–41)
Albumin: 2.2 g/dL — ABNORMAL LOW (ref 3.5–5.0)
Alkaline Phosphatase: 82 U/L (ref 38–126)
Anion gap: 5 (ref 5–15)
BUN: 31 mg/dL — ABNORMAL HIGH (ref 6–20)
CO2: 23 mmol/L (ref 22–32)
Calcium: 8 mg/dL — ABNORMAL LOW (ref 8.9–10.3)
Chloride: 109 mmol/L (ref 98–111)
Creatinine, Ser: 1.16 mg/dL (ref 0.61–1.24)
GFR, Estimated: >60 mL/min (ref >60)
Glucose, Bld: 202 mg/dL — ABNORMAL HIGH (ref 70–99)
Potassium: 4.9 mmol/L (ref 3.5–5.1)
Sodium: 137 mmol/L (ref 135–145)
Total Bilirubin: 14.6 mg/dL — ABNORMAL HIGH (ref 0.3–1.2)
Total Protein: 5.3 g/dL — ABNORMAL LOW (ref 6.5–8.1)

## 2022-01-17 MED ORDER — NICOTINE 21 MG/24HR TD PT24
21.0000 mg | MEDICATED_PATCH | Freq: Every day | TRANSDERMAL | Status: DC
  Administered 2022-01-17 – 2022-01-20 (×4): 21 mg via TRANSDERMAL
  Filled 2022-01-17 (×5): qty 1

## 2022-01-17 MED ORDER — RIFAXIMIN 550 MG PO TABS
550.0000 mg | ORAL_TABLET | Freq: Two times a day (BID) | ORAL | Status: DC
  Administered 2022-01-17 – 2022-01-21 (×9): 550 mg via ORAL
  Filled 2022-01-17 (×9): qty 1

## 2022-01-17 MED ORDER — NICOTINE POLACRILEX 2 MG MT GUM
2.0000 mg | CHEWING_GUM | OROMUCOSAL | Status: DC | PRN
  Filled 2022-01-17: qty 1

## 2022-01-17 MED ORDER — ORAL CARE MOUTH RINSE: 15.0000 mL | OROMUCOSAL | Status: DC | PRN

## 2022-01-17 NOTE — Progress Notes (Signed)
Inpatient Rehab Admissions:  Inpatient Rehab Consult received.  I called pt on the telephone for rehabilitation assessment and to discuss goals and expectations of an inpatient rehab admission.  No one answered. NSG gave [facility] wife's cell phone number. Called pt's wife. Explained CIR goals and expectations. Wife informed AC that she would prefer a SNF over CIR for pt's ongoing rehab. TOC made aware. AC will sign off.    Signed: Gayland Curry, [place], [place] Admissions Coordinator [phone]

## 2022-01-17 NOTE — Progress Notes (Signed)
Subjective:  No abdominal pain. No blood in stool. Wants to go home.  Objective: Vital signs in last 24 hours: Temp:  [97.3 F (36.3 C)-98.1 F (36.7 C)] 97.7 F (36.5 C) (09/23 1008) Pulse Rate:  [50-72] 57 (09/23 1008) Resp:  [11-26] 18 (09/23 1008) BP: (106-198)/(36-95) 146/77 (09/23 1008) SpO2:  [94 %-100 %] 100 % (09/23 1008) Weight:  [82.5 kg] 82.5 kg (09/23 0500) Weight change: -0.891 kg Last BM Date : 01/16/22  PE: GEN:  Awake, intermittently arousable ABD:  Mod distention, non-tender  Lab Results: CBC    Component Value Date/Time   WBC 8.0 01/16/2022 0221   RBC 3.21 (L) 01/16/2022 0221   HGB 10.9 (L) 01/16/2022 0221   HGB 12.8 (L) 08/26/2021 0818   HGB 12.8 (L) 10/05/2018 0923   HGB 12.8 (L) 10/05/2018 0923   HCT 33.2 (L) 01/16/2022 0221   HCT 35.8 (L) 10/05/2018 0923   HCT 35.8 (L) 10/05/2018 0923   PLT 91 (L) 01/16/2022 0221   PLT 70 (L) 08/26/2021 0818   PLT 138 (L) 10/05/2018 0923   PLT 138 (L) 10/05/2018 0923   MCV 103.4 (H) 01/16/2022 0221   MCV 95 10/05/2018 0923   MCV 95 10/05/2018 0923   MCH 34.0 01/16/2022 0221   MCHC 32.8 01/16/2022 0221   RDW 17.1 (H) 01/16/2022 0221   RDW 12.0 10/05/2018 0923   RDW 12.0 10/05/2018 0923   LYMPHSABS 0.4 (L) 01/16/2022 0221   LYMPHSABS 1.1 10/05/2018 0923   LYMPHSABS 1.1 10/05/2018 0923   MONOABS 0.6 01/16/2022 0221   EOSABS 0.0 01/16/2022 0221   EOSABS 0.3 10/05/2018 0923   EOSABS 0.3 10/05/2018 0923   BASOSABS 0.0 01/16/2022 0221   BASOSABS 0.0 10/05/2018 0923   BASOSABS 0.0 10/05/2018 0923  CMP     Component Value Date/Time   NA 137 01/17/2022 0248   NA 136 10/05/2018 0923   K 4.9 01/17/2022 0248   CL 109 01/17/2022 0248   CO2 23 01/17/2022 0248   GLUCOSE 202 (H) 01/17/2022 0248   BUN 31 (H) 01/17/2022 0248   BUN 6 10/05/2018 0923   CREATININE 1.16 01/17/2022 0248   CREATININE 0.77 09/09/2020 0837   CALCIUM 8.0 (L) 01/17/2022 0248   PROT 5.3 (L) 01/17/2022 0248   PROT 6.7 10/05/2018 0923    ALBUMIN 2.2 (L) 01/17/2022 0248   ALBUMIN 3.8 10/05/2018 0923   AST 103 (H) 01/17/2022 0248   AST 84 (H) 09/09/2020 0837   ALT 72 (H) 01/17/2022 0248   ALT 32 09/09/2020 0837   ALKPHOS 82 01/17/2022 0248   BILITOT 14.6 (H) 01/17/2022 0248   BILITOT 1.4 (H) 09/09/2020 0837   GFRNONAA >60 01/17/2022 0248   GFRNONAA >60 09/09/2020 0837   GFRAA 115 10/05/2018 0923    Assessment:   Elevated LFTs. Alcohol abuse. Cirrhosis.  Coffee ground emesis, resolved. Altered mental status.  Plan:   Xifaxan 550 mg po bid. Carvedilol 3.125 mg po bid. Continue prednisolone; follow LFT trend. PT/OT evaluation. Low sodium diet as tolerated. Alcohol abstinence. Consider U/S Monday if distention persists to assess for ascites. [facility] will follow.   SERAPIO, EDELSON 01/17/2022, 12:41 PM   Cell [phone] If no answer or after 5 PM call [phone]

## 2022-01-17 NOTE — TOC Progression Note (Signed)
Transition of Care [facility]) - Progression Note    Patient Details  Name: ERIVERTO BYRNES MRN: [id number] Date of Birth: April 18, 1961  [facility]) CM/SW Contact  Ross Ludwig, [place] Phone Number: 01/17/2022, 7:37 PM  Clinical Narrative:     CSW spoke to patient's wife to discuss CIR verse SNF placement.  Per patient's wife, they are considering having him return back home or possibly go to [facility].  CSW spoke to [place] at [facility], and she said they are not in network with patient's insurance, but they will have to verify his insurance on Monday to see if he has any out of network benefits.  Per patient's wife, she said they know the owners of [facility] and are planning to talk to them to see if there is anyway patient can go there for rehab.  CIR was reviewing patient, but patient and his wife expressed they are not interested in going to CIR at this time.  CSW informed patient's wife, that patient will have to be accepted to SNF in order to, and CSW will start the process to look for bed placement.  Patient's wife gave CSW permission to begin bed search.   Expected Discharge Plan: Home/Self Care Barriers to Discharge: Continued Medical Work up  Expected Discharge Plan and Services Expected Discharge Plan: Home/Self Care       Living arrangements for the past 2 months: Single Family Home                                       Social Determinants of Health (SDOH) Interventions    Readmission Risk Interventions     No data to display

## 2022-01-17 NOTE — Progress Notes (Signed)
  Progress Note   Patient: Leroy Bell [id number] DOB: 1960-07-07 DOA: 01/09/2022     8 DOS: the patient was seen and examined on 01/17/2022 at 8:02AM      Brief hospital course: Mr. Widen is a 61 y.o. M with alcohol dependence, cirrhosis, pHTN, HTN, IDA, OSA on CPAP who was admitted for UGIB possibly variceal and alcohol withdarwl delirium   Assessment and Plan: * Hemorrhagic shock due to GI bleed Hgb has remained stable - Continue PPI     Delirium tremens ([place]) Gradually improving - Physical therapy - Continuehi tamine and folate  Controlled type 2 diabetes mellitus without complication, without long-term current use of insulin ([name]) Glucoses still silghtly elevated, improving - Continue SS corrections  Esophageal varices ([name]) Bradycardia still precludes coreg  Thrombocytopenia ([name]) Plt stable, no bleeding   Acute renal failure (ARF) ([name]) Cr improved with fluids - Stop fluids   Alcoholic cirrhosis of liver without ascites ([name]) Alcoholic hepatitis - Continue prednisolone, day 4             Subjective: Patient still pretty somnolent most of the time, no fever, no respiratory symptoms, no vomiting, no more melena or vomiting blood.     Physical Exam: BP (!) 146/77 (BP Location: Right Arm)   Pulse (!) 57   Temp 97.7 F (36.5 C) (Oral)   Resp 18   Ht 5\' 7"  (1.702 m)   Wt 82.5 kg   SpO2 100%   BMI 28.49 kg/m   Obese adult male, sleeping, arouses sluggishly then goes back to sleep Bradycardic, regular, no murmurs,  Respiratory rate normal, lungs clear without rales or wheezes Abdomen/protruding, not think this is truly ascites, mild nonpitting peripheral edema Attention diminished, affect blunted, generalized weakness, still disoriented.  Data Reviewed: Basic metabolic panel shows creatinine 1.1, sodium potassium normal LFTs no change Glucose still elevated but slightly better  Family Communication: None at the  bedside    Disposition: Status is: Inpatient         Author: Edwin Dada, MD 01/17/2022 11:35 AM  For on call review [email or web address].

## 2022-01-17 NOTE — Progress Notes (Signed)
Physical Therapy Treatment Patient Details Name: Leroy Bell MRN: [id number] DOB: 05-Jul-1960 Today's Date: 01/17/2022   History of Present Illness Pt is a 61 yo male presenting to [facility] ED on 01/09/22 with complaints of nausea & vomiting, diarrhea found down. PMH: DM, HLD, HTN, ETOH abuse, hepatic cirrhosis.    PT Comments    Pt seen in room 1512 General Comments: AxO x 2 intermittent confusion to current situation/time/place requiring repeat instructions.  Unable to retain new information. Lunch tray in front of pt but he is unable to feed self.  Spouse and Son present.  Assisted OOB to amb was difficult.  General bed mobility comments: Patient able to initiate transfer to EOB. guarding to pivot and scoot to edge. Delayed motor control.  Inability to self perform.  Required Max Assist to com-pllete scooting to EOB using bed pad. General transfer comment: required increased assist this session present with severe posterior lean and mild tremors.  Pt was unable to self rise and unable to use AD due to poor hand grip/coordination. General Gait Details: Very unsteady gait requiring + 2 hand held assist as pt was too uncoordinated to use a walker drifting left.  Staggering.  Very short shuffled steps and posteriior lean.  HIGH FALL RISK.  Pt was Indep prior to admit (working/driving).  Pt would benefit from aggressive Rehab such as Int Rehab.     Recommendations for follow up therapy are one component of a multi-disciplinary discharge planning process, led by the attending physician.  Recommendations may be updated based on patient status, additional functional criteria and insurance authorization.  Follow Up Recommendations  Acute inpatient rehab (3hours/day)     Assistance Recommended at Discharge Intermittent Supervision/Assistance  Patient can return home with the following A little help with walking and/or transfers;A little help with bathing/dressing/bathroom;Assistance with  cooking/housework;Assist for transportation;Help with stairs or ramp for entrance   Equipment Recommendations  None recommended by PT    Recommendations for Other Services       Precautions / Restrictions Precautions Precautions: Fall Precaution Comments: ETOH ataxia Restrictions Weight Bearing Restrictions: No     Mobility  Bed Mobility Overal bed mobility: Needs Assistance Bed Mobility: Supine to Sit     Supine to sit: Min assist, Mod assist     General bed mobility comments: Patient able to initiate transfer to EOB. guarding to pivot and scoot to edge. Delayed motor control.  Inability to self perform.  Required Max Assist to com-pllete scooting to EOB using bed pad.    Transfers Overall transfer level: Needs assistance Equipment used: None Transfers: Sit to/from Stand Sit to Stand: Mod assist, +2 physical assistance, +2 safety/equipment           General transfer comment: required increased assist this session present with severe posterior lean and mild tremors.  Pt was unable to self rise and unable to use AD due to poor hand grip/coordination.    Ambulation/Gait Ambulation/Gait assistance: Mod assist, +2 physical assistance, +2 safety/equipment Gait Distance (Feet): 250 Feet Assistive device: 2 person hand held assist Gait Pattern/deviations: Step-through pattern, Decreased step length - right, Decreased step length - left, Decreased stride length, Shuffle, Trunk flexed, Narrow base of support, Staggering left, Staggering right Gait velocity: decreased     General Gait Details: Very unsteady gait requiring + 2 hand held assist as pt was too uncoordinated to use a walker drifting left.  Staggering.  Very short shuffled steps and posteriior lean.  HIGH FALL RISK.   Stairs  Wheelchair Mobility    Modified Rankin (Stroke Patients Only)       Balance                                            Cognition  Arousal/Alertness: Awake/alert Behavior During Therapy: Flat affect Overall Cognitive Status: Impaired/Different from baseline                                 General Comments: AxO x 2 intermittent confusion to current situation/time/place requiring repeat instructions.  Unable to retain new information.        Exercises      General Comments        Pertinent Vitals/Pain Pain Assessment Pain Assessment: No/denies pain    Home Living                          Prior Function            PT Goals (current goals can now be found in the care plan section) Progress towards PT goals: Progressing toward goals    Frequency    Min 3X/week      PT Plan Current plan remains appropriate    Co-evaluation              AM-PAC PT "6 Clicks" Mobility   Outcome Measure  Help needed turning from your back to your side while in a flat bed without using bedrails?: A Lot Help needed moving from lying on your back to sitting on the side of a flat bed without using bedrails?: A Lot Help needed moving to and from a bed to a chair (including a wheelchair)?: A Lot Help needed standing up from a chair using your arms (e.g., wheelchair or bedside chair)?: A Lot Help needed to walk in hospital room?: A Lot Help needed climbing 3-5 steps with a railing? : A Lot 6 Click Score: 12    End of Session Equipment Utilized During Treatment: Gait belt Activity Tolerance: No increased pain;Patient tolerated treatment well Patient left: in chair;with call bell/phone within reach;with chair alarm set;with family/visitor present Nurse Communication: Mobility status PT Visit Diagnosis: History of falling (Z91.81);Difficulty in walking, not elsewhere classified (R26.2);Unsteadiness on feet (R26.81)     Time: 1443-1540 PT Time Calculation (min) (ACUTE ONLY): 19 min  Charges:  $Gait Training: 8-22 mins                     Felecia Shelling  PTA Acute  Rehabilitation  Services Office M-F          [phone] Weekend pager [phone]

## 2022-01-17 NOTE — NC FL2 (Signed)
[place] LEVEL OF CARE SCREENING TOOL     IDENTIFICATION  Patient Name: Leroy Bell Birthdate: Jan 29, 1961 Sex: male Admission Date (Current Location): 01/09/2022  [facility] and [place] Number:  Herbalist and Address:  [facility],  [address], [place]      Provider Number: [id number]  Attending Physician Name and Address:  Edwin Dada, *  Relative Name and Phone Number:  DELRICO, MINEHART [phone]  [phone]    Current Level of Care: Hospital Recommended Level of Care: [place] Prior Approval Number:    Date Approved/Denied:   PASRR Number: [id number]  Discharge Plan:      Current Diagnoses: Patient Active Problem List   Diagnosis Date Noted   Controlled type 2 diabetes mellitus without complication, without long-term current use of insulin ([place]) 01/16/2022   Delirium tremens ([place]) 01/14/2022   Esophageal varices ([place]) 01/14/2022   Acute renal failure (ARF) ([place]) 01/09/2022   Acute blood loss anemia 01/09/2022   Hemorrhagic shock due to GI bleed 01/09/2022   Thrombocytopenia ([place]) 01/09/2022   Essential hypertension 11/04/2021   Thiamine deficiency 11/04/2021   Heart murmur 11/04/2021   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 05/08/2021   DDD (degenerative disc disease), cervical 05/08/2021   Encounter for Department of Transportation (DOT) examination for trucking license 29/52/8413   Anemia due to acquired thiamine deficiency 24/40/1027   Alcoholic cirrhosis of liver without ascites ([place]) 01/30/2021   Hyperlipidemia LDL goal <70 01/30/2021   Encounter for general adult medical examination with abnormal findings 01/29/2021   OSA on CPAP 01/29/2021   Need for vaccination 01/29/2021   Abnormal electrocardiogram (ECG) (EKG) 01/29/2021   Other pancytopenia ([place]) 09/09/2020   Iron deficiency anemia due to chronic blood loss 07/01/2020   Type 2 diabetes mellitus without complication,  without long-term current use of insulin ([place]) 09/01/2018   Alcohol abuse 08/23/2018   Healthcare maintenance 08/23/2018   HTN, goal below 130/80    Sinus tachycardia    Alcohol use disorder, severe, dependence ([place]) 07/28/2018    Orientation RESPIRATION BLADDER Height & Weight     Self, Place  Normal Incontinent Weight: 181 lb 14.4 oz (82.5 kg) Height:  5\' 7"  (170.2 cm)  BEHAVIORAL SYMPTOMS/MOOD NEUROLOGICAL BOWEL NUTRITION STATUS      Continent Diet (Dysphagia 3)  AMBULATORY STATUS COMMUNICATION OF NEEDS Skin   Limited Assist Verbally Normal                       Personal Care Assistance Level of Assistance  Bathing, Feeding, Dressing Bathing Assistance: Limited assistance Feeding assistance: Independent Dressing Assistance: Limited assistance     Functional Limitations Info  Sight, Hearing, Speech Sight Info: Adequate Hearing Info: Adequate Speech Info: Adequate    SPECIAL CARE FACTORS FREQUENCY  PT (By licensed PT), OT (By licensed OT)     PT Frequency: Minimum 5x a week OT Frequency: Minimum 5x a week            Contractures Contractures Info: Not present    Additional Factors Info  Code Status, Allergies, Insulin Sliding Scale Code Status Info: Full Code Allergies Info: Penicillins   Insulin Sliding Scale Info: insulin aspart (novoLOG) injection 0-15 Units 3x a day with meals.       Current Medications (01/17/2022):  This is the current hospital active medication list Current Facility-Administered Medications  Medication Dose Route Frequency Provider Last Rate Last Admin   0.9 %  sodium chloride  infusion   Intravenous PRN Willis Modena, MD   Stopped at 01/13/22 2139   carvedilol (COREG) tablet 3.125 mg  3.125 mg Oral BID WC Danford, Earl Lites, MD   3.125 mg at 01/17/22 1604   feeding supplement (ENSURE ENLIVE / ENSURE PLUS) liquid 237 mL  237 mL Oral BID BM Danford, Earl Lites, MD   237 mL at 01/17/22 1356   folic acid (FOLVITE) tablet 1  mg  1 mg Oral Daily Willis Modena, MD   1 mg at 01/17/22 7510   hydrALAZINE (APRESOLINE) injection 10 mg  10 mg Intravenous Q6H PRN Omelia Blackwater K, NP   10 mg at 01/16/22 2139   insulin aspart (novoLOG) injection 0-15 Units  0-15 Units Subcutaneous TID WC Danford, Earl Lites, MD   3 Units at 01/17/22 1710   insulin aspart (novoLOG) injection 0-5 Units  0-5 Units Subcutaneous QHS Danford, Earl Lites, MD       lactated ringers infusion   Intravenous Continuous Willis Modena, MD   Stopped at 01/15/22 2001   LORazepam (ATIVAN) injection 1 mg  1 mg Intravenous Q4H PRN Willis Modena, MD   1 mg at 01/16/22 2139   multivitamin with minerals tablet 1 tablet  1 tablet Oral Daily Willis Modena, MD   1 tablet at 01/17/22 2585   nicotine (NICODERM CQ - dosed in mg/24 hours) patch 21 mg  21 mg Transdermal Daily Danford, Earl Lites, MD   21 mg at 01/17/22 1604   nicotine polacrilex (NICORETTE) gum 2 mg  2 mg Oral PRN Danford, Earl Lites, MD       ondansetron (ZOFRAN) tablet 4 mg  4 mg Oral Q6H PRN Willis Modena, MD       Or   ondansetron (ZOFRAN) injection 4 mg  4 mg Intravenous Q6H PRN Willis Modena, MD       Oral care mouth rinse  15 mL Mouth Rinse PRN Danford, Earl Lites, MD       pantoprazole (PROTONIX) injection 40 mg  40 mg Intravenous Q12H Alberteen Sam, MD   40 mg at 01/17/22 2778   prednisoLONE tablet 40 mg  40 mg Oral Q breakfast Willis Modena, MD   40 mg at 01/17/22 1032   rifaximin (XIFAXAN) tablet 550 mg  550 mg Oral BID Willis Modena, MD   550 mg at 01/17/22 1356   sodium chloride flush (NS) 0.9 % injection 3 mL  3 mL Intravenous Q12H Willis Modena, MD   3 mL at 01/17/22 0908   thiamine (VITAMIN B1) tablet 100 mg  100 mg Oral Daily Willis Modena, MD   100 mg at 01/10/22 0945     Discharge Medications: Please see discharge summary for a list of discharge medications.  Relevant Imaging Results:  Relevant Lab Results:   Additional  Information SSN [id number]  Darleene Cleaver, LCSW

## 2022-01-18 DIAGNOSIS — R578 Other shock: Secondary | ICD-10-CM | POA: Diagnosis not present

## 2022-01-18 LAB — GLUCOSE, CAPILLARY
Glucose-Capillary: 230 mg/dL — ABNORMAL HIGH (ref 70–99)
Glucose-Capillary: 223 mg/dL — ABNORMAL HIGH (ref 70–99)
Glucose-Capillary: 270 mg/dL — ABNORMAL HIGH (ref 70–99)
Glucose-Capillary: 272 mg/dL — ABNORMAL HIGH (ref 70–99)

## 2022-01-18 MED ORDER — INSULIN GLARGINE-YFGN 100 UNIT/ML ~~LOC~~ SOLN
10.0000 [IU] | Freq: Every day | SUBCUTANEOUS | Status: DC
  Administered 2022-01-18 – 2022-01-21 (×4): 10 [IU] via SUBCUTANEOUS
  Filled 2022-01-18 (×4): qty 0.1

## 2022-01-18 MED ORDER — INSULIN ASPART 100 UNIT/ML IJ SOLN
3.0000 [IU] | Freq: Three times a day (TID) | INTRAMUSCULAR | Status: DC
  Administered 2022-01-18 – 2022-01-21 (×5): 3 [IU] via SUBCUTANEOUS

## 2022-01-18 NOTE — TOC Progression Note (Signed)
Transition of Care [facility]) - Progression Note    Patient Details  Name: Leroy Bell MRN: [id number] Date of Birth: 12/16/1960  Transition of Care [facility]) CM/SW Contact  Fallen Crisostomo, Juliann Pulse, RN Phone Number: 01/18/2022, 2:45 PM  Clinical Narrative:Rcvd message family interested in CIR-message sent to Lauren-CIR rep;Left vm w/[name](spouse) w/call back #.       Expected Discharge Plan: IP Rehab Facility Barriers to Discharge: Continued Medical Work up  Expected Discharge Plan and Services Expected Discharge Plan: [place]       Living arrangements for the past 2 months: Single Family Home                                       Social Determinants of Health (SDOH) Interventions    Readmission Risk Interventions     No data to display

## 2022-01-18 NOTE — Plan of Care (Signed)
  Problem: Education: Goal: Knowledge of General Education information will improve Description Including pain rating scale, medication(s)/side effects and non-pharmacologic comfort measures Outcome: Progressing   

## 2022-01-18 NOTE — Progress Notes (Signed)
  Progress Note   Patient: Leroy Bell NKN:[id number] DOB: 03-05-1961 DOA: 01/09/2022     9 DOS: the patient was seen and examined on 01/18/2022 at       Brief hospital course: Leroy Bell is a 61 y.o. M with alcohol dependence, cirrhosis, pHTN, HTN, IDA, OSA on CPAP who presented with likely variceal bleeding and alcohol withdrawal delirium     Assessment and Plan: * Hemorrhagic shock due to GI bleed Resolved, no further melena, hematemesis. - Trend CBC - Continue PPI  Alcoholic cirrhosis of liver without ascites ([name]) Alcoholic hepatitis Esophageal varices - Continuet steroids, day 5 - Check Tbili and INR tomorrow and Tues, does not appear to be responding - Consult GI, appreciate cares - Unable to tolerate BB for varices at all - Consult GI, appreciate cares - Continue Xifaxan   Delirium tremens (Leroy Bell) Delirium somewhat better.  Has some residual cognitive impairment - Recommend SLP, CIR  Controlled type 2 diabetes mellitus without complication, without long-term current use of insulin ([name]) - Start Lantus - Continue SS corrections        OSA on CPAP - CPAP   Alcohol use disorder, severe, dependence ([name]) - Continue thiamine and flate          Subjective: No fever, vomiting, seizures, change in swelling, change in respiratory status.     Physical Exam: BP 125/61 (BP Location: Left Arm)   Pulse 65   Temp 98.2 F (36.8 C) (Oral)   Resp 18   Ht 5\' 7"  (1.702 m)   Wt 83.9 kg   SpO2 100%   BMI 28.96 kg/m    Adult male, lying in bed, appears weak and tired, jaundiced, slight psychomotor slowing, debilitated. Heart rate slow, regular, no murmurs, mild nonpitting diffuse edema Respiratory rate normal, lungs clear without rales wheezes, Abdomen soft, no tenderness palpation, slightly distended, do not really appreciate significant ascites Attention blunted, affect flat, oriented to [facility], family, not date or time, attention seems  somewhat distracted, speech fluent, face symmetric tremor    Data Reviewed: Elevated glucose  Family Communication: Wife is at the bedside    Disposition: Status is: Inpatient Stabilized.  GI will determine continuation of steroids in next 1-2 days, essentially erady for discharge if safe disposition can be fuond        Author: Edwin Dada, MD 01/18/2022 2:57 PM  For on call review [email or web address].

## 2022-01-18 NOTE — Progress Notes (Signed)
Subjective: No abdominal pain. No blood in emesis or stool.  Objective: Vital signs in last 24 hours: Temp:  [97.6 F (36.4 C)-98.2 F (36.8 C)] 98.2 F (36.8 C) (09/24 1246) Pulse Rate:  [57-65] 65 (09/24 1246) Resp:  [18-20] 18 (09/24 1246) BP: (120-148)/(57-85) 125/61 (09/24 1246) SpO2:  [98 %-100 %] 100 % (09/24 1246) Weight:  [83.9 kg] 83.9 kg (09/24 0500) Weight change: 1.361 kg Last BM Date : 01/16/22  PE: GEN:  Alert, NAD HEENT:  Scleral icterus,  SKIN:  Jaundiced ABD:  Mild distended, non-tender NEURO:  Alert, no encephalopathy  Lab Results: CBC    Component Value Date/Time   WBC 8.0 01/16/2022 0221   RBC 3.21 (L) 01/16/2022 0221   HGB 10.9 (L) 01/16/2022 0221   HGB 12.8 (L) 08/26/2021 0818   HGB 12.8 (L) 10/05/2018 0923   HGB 12.8 (L) 10/05/2018 0923   HCT 33.2 (L) 01/16/2022 0221   HCT 35.8 (L) 10/05/2018 0923   HCT 35.8 (L) 10/05/2018 0923   PLT 91 (L) 01/16/2022 0221   PLT 70 (L) 08/26/2021 0818   PLT 138 (L) 10/05/2018 0923   PLT 138 (L) 10/05/2018 0923   MCV 103.4 (H) 01/16/2022 0221   MCV 95 10/05/2018 0923   MCV 95 10/05/2018 0923   MCH 34.0 01/16/2022 0221   MCHC 32.8 01/16/2022 0221   RDW 17.1 (H) 01/16/2022 0221   RDW 12.0 10/05/2018 0923   RDW 12.0 10/05/2018 0923   LYMPHSABS 0.4 (L) 01/16/2022 0221   LYMPHSABS 1.1 10/05/2018 0923   LYMPHSABS 1.1 10/05/2018 0923   MONOABS 0.6 01/16/2022 0221   EOSABS 0.0 01/16/2022 0221   EOSABS 0.3 10/05/2018 0923   EOSABS 0.3 10/05/2018 0923   BASOSABS 0.0 01/16/2022 0221   BASOSABS 0.0 10/05/2018 0923   BASOSABS 0.0 10/05/2018 0923   Assessment:   Elevated LFTs. Alcohol abuse. Cirrhosis.  Coffee ground emesis, resolved. Altered mental status.  Plan:   Carvedilol 3.125 mg po bid now and upon discharge. Xifaxan 550 mg po bid, now and upon discharge. Alcohol rehab counseling. Continue prednisolone 40 mg po qd, now and upon discharge (anticipated total 8 week course). Pantoprazole 40 mg po  bid now and upon discharge. PT/OT evaluation to ensure physical fitness level adequate to return home. Likely ok to discharge home otherwise from GI perspective in 1-2 days. Would need repeat LFTs about one week after discharge. [facility] will sign-off; please call with questions; thank you for the consultation.   ERAN, MISTRY 01/18/2022, 2:06 PM   Cell [phone] If no answer or after 5 PM call [phone]

## 2022-01-18 NOTE — Evaluation (Signed)
Occupational Therapy Treatment Patient Details Name: Leroy Bell MRN: [id number] DOB: 1961/03/06 Today's Date: 01/18/2022   History of present illness Pt is a 61 yo male presenting to [facility] ED on 01/09/22 with complaints of nausea & vomiting, diarrhea found down. PMH: DM, HLD, HTN, ETOH abuse, hepatic cirrhosis.   OT comments  Treatment focused on advancing functional mobility and promoting out of bed activities to work towards independent ADLs. Patient min guard for sit to stand and ambulation with walker in hallway approx 100 feet with no overt loss of balance. Patient completed toileting task with verbal cues and use of grab bars. Patient stood at sink to wash hands. Will continue POC to progress patient.   Recommendations for follow up therapy are one component of a multi-disciplinary discharge planning process, led by the attending physician.  Recommendations may be updated based on patient status, additional functional criteria and insurance authorization.    Follow Up Recommendations  Acute inpatient rehab (3hours/day)    Assistance Recommended at Discharge Frequent or constant Supervision/Assistance  Patient can return home with the following  A lot of help with bathing/dressing/bathroom;Direct supervision/assist for medications management;Direct supervision/assist for financial management;Assist for transportation;Assistance with cooking/housework;Help with stairs or ramp for entrance;A little help with walking and/or transfers   Equipment Recommendations  Other (comment) (defer)    Recommendations for Other Services Rehab consult    Precautions / Restrictions Precautions Precautions: Fall Precaution Comments: ETOH ataxia Restrictions Weight Bearing Restrictions: No       Mobility Bed Mobility Overal bed mobility: Needs Assistance Bed Mobility: Supine to Sit     Supine to sit: Supervision          Transfers Overall transfer level: Needs assistance Equipment  used: None Transfers: Sit to/from Stand Sit to Stand: Min guard                 Balance Overall balance assessment: Needs assistance Sitting-balance support: Feet supported Sitting balance-[name]: Good     Standing balance support: Reliant on assistive device for balance, During functional activity Standing balance-[name]: Fair                             ADL either performed or assessed with clinical judgement   ADL Overall ADL's : Needs assistance/impaired     Grooming: Standing;Wash/dry face;Min guard                   Toilet Transfer: Regular Toilet;Rolling walker (2 wheels);Min guard   Toileting- Clothing Manipulation and Hygiene: Sit to/from stand;Min guard       Functional mobility during ADLs: Rolling walker (2 wheels);Min guard      Extremity/Trunk Assessment              Vision Patient Visual Report: No change from baseline Vision Assessment?: No apparent visual deficits   Perception     Praxis      Cognition Arousal/Alertness: Awake/alert Behavior During Therapy: WFL for tasks assessed/performed Overall Cognitive Status: Within Functional Limits for tasks assessed                           Safety/Judgement: Decreased awareness of safety              Exercises      Shoulder Instructions       General Comments      Pertinent Vitals/ Pain  Pain Assessment Pain Assessment: No/denies pain  Home Living                                          Prior Functioning/Environment              Frequency  Min 2X/week        Progress Toward Goals  OT Goals(current goals can now be found in the care plan section)     Acute Rehab OT Goals Patient Stated Goal: to walk in the hallway OT Goal Formulation: With patient Time For Goal Achievement: 02/01/22 Potential to Achieve Goals: Good  Plan      Co-evaluation                 AM-PAC OT "6 Clicks"  Daily Activity     Outcome Measure   Help from another person eating meals?: A Little Help from another person taking care of personal grooming?: A Little Help from another person toileting, which includes using toliet, bedpan, or urinal?: A Little Help from another person bathing (including washing, rinsing, drying)?: A Lot Help from another person to put on and taking off regular upper body clothing?: A Little Help from another person to put on and taking off regular lower body clothing?: A Lot 6 Click Score: 16    End of Session Equipment Utilized During Treatment: Rolling walker (2 wheels)  OT Visit Diagnosis: Unsteadiness on feet (R26.81);Other abnormalities of gait and mobility (R26.89);Pain   Activity Tolerance Patient tolerated treatment well   Patient Left in chair;with call bell/phone within reach;with family/visitor present;with chair alarm set   Nurse Communication Mobility status        Time: 1005-1030 OT Time Calculation (min): 25 min  Charges: OT General Charges $OT Visit: 1 Visit OT Treatments $Self Care/Home Management : 8-22 mins $Therapeutic Activity: 23-37 mins  Charlann Lange, OTS Acute rehab services   Charlann Lange 01/18/2022, 5:04 PM

## 2022-01-19 DIAGNOSIS — R578 Other shock: Secondary | ICD-10-CM | POA: Diagnosis not present

## 2022-01-19 LAB — PROTIME-INR
INR: 2.2 — ABNORMAL HIGH (ref 0.8–1.2)
Prothrombin Time: 24.3 seconds — ABNORMAL HIGH (ref 11.4–15.2)

## 2022-01-19 LAB — COMPREHENSIVE METABOLIC PANEL
ALT: 100 U/L — ABNORMAL HIGH (ref 0–44)
AST: 123 U/L — ABNORMAL HIGH (ref 15–41)
Albumin: 2.2 g/dL — ABNORMAL LOW (ref 3.5–5.0)
Alkaline Phosphatase: 108 U/L (ref 38–126)
Anion gap: 6 (ref 5–15)
BUN: 32 mg/dL — ABNORMAL HIGH (ref 6–20)
CO2: 22 mmol/L (ref 22–32)
Calcium: 8.3 mg/dL — ABNORMAL LOW (ref 8.9–10.3)
Chloride: 108 mmol/L (ref 98–111)
Creatinine, Ser: 1.03 mg/dL (ref 0.61–1.24)
GFR, Estimated: >60 mL/min (ref >60)
Glucose, Bld: 265 mg/dL — ABNORMAL HIGH (ref 70–99)
Potassium: 5 mmol/L (ref 3.5–5.1)
Sodium: 136 mmol/L (ref 135–145)
Total Bilirubin: 17.1 mg/dL — ABNORMAL HIGH (ref 0.3–1.2)
Total Protein: 5.5 g/dL — ABNORMAL LOW (ref 6.5–8.1)

## 2022-01-19 LAB — GLUCOSE, CAPILLARY
Glucose-Capillary: 210 mg/dL — ABNORMAL HIGH (ref 70–99)
Glucose-Capillary: 206 mg/dL — ABNORMAL HIGH (ref 70–99)
Glucose-Capillary: 208 mg/dL — ABNORMAL HIGH (ref 70–99)
Glucose-Capillary: 266 mg/dL — ABNORMAL HIGH (ref 70–99)

## 2022-01-19 MED ORDER — PANTOPRAZOLE SODIUM 40 MG PO TBEC
40.0000 mg | DELAYED_RELEASE_TABLET | Freq: Two times a day (BID) | ORAL | Status: DC
  Administered 2022-01-19 – 2022-01-21 (×4): 40 mg via ORAL
  Filled 2022-01-19 (×4): qty 1

## 2022-01-19 NOTE — Progress Notes (Signed)
OT Cancellation Note  Patient Details Name: Leroy Bell MRN: [id number] DOB: Oct 04, 1960   Cancelled Treatment:    Reason Eval/Treat Not Completed: Other (comment) Patient in bed sleeping with family reporting patient having upset stomach just prior to falling asleep. Family asking for therapy to hold off at this time. OT to continue to follow and check back as schedule will allow.  [name], [facility] Office# [phone]  01/19/2022, 11:24 AM

## 2022-01-19 NOTE — Progress Notes (Signed)
  Progress Note   Patient: Leroy Bell TIR:[id number] DOB: Mar 19, 1961 DOA: 01/09/2022     10 DOS: the patient was seen and examined on 01/19/2022 at 8:28AM      Brief hospital course: Leroy Bell is a 61 y.o. M with alcohol dependence, cirrhosis, pHTN, HTN, IDA, OSA on CPAP who presented with likely variceal bleeding and alcohol withdrawal delirium     Assessment and Plan: * Hemorrhagic shock due to GI bleed Acute blood loss anemia Resolved, no further melena, hematemesis. - Repeat CBC tomorrow - Continue PPI  Alcoholic cirrhosis of liver without ascites ([name]) Alcoholic hepatitis Esophageal varices Alcohol use disorder, severe, dependence ([place]) I don't appreciate much clinical ascites.  Tbili trended up slightly to 17 today, INR 2.2, no change.   - Continue prednisolone, day 7 - Consult GI, appreciate cares - Unable to tolerate BB for varices at all - Consult GI, appreciate cares - Continue Xifaxan - Continuehi tamine and folate   Delirium tremens Delirium resolved.  Watched football last night and able to tell me about this this morning.   - Recommend SLP, CIR  Controlled type 2 diabetes mellitus without complication, without long-term current use of insulin ([name]) Glucoses still somewhat high - Continue Lantus - ContinueS S corrections    OSA on CPAP - Continue CPAP            Subjective: No increase in swelling, no vomiting, no seizures, no change in mental status or respiratory status.     Physical Exam: BP 137/74 (BP Location: Left Arm)   Pulse 63   Temp 97.7 F (36.5 C) (Oral)   Resp 16   Ht 5\' 7"  (1.702 m)   Wt 83.6 kg   SpO2 99%   BMI 28.85 kg/m   Adult male, lying in bed, appears sleepy but improving, shortness unchanged Slow heart rate, regular, no murmurs, mild nonpitting diffuse edema Respiratory rate normal, lungs clear without rales or wheezes Abdomen soft without tenderness palpation or guarding, maybe some mild distention but  not significant ascites Attention blunted, affect normal, oriented to person, place, situation, attention seems improved today, speech fluent, face symmetric, tremor improved     Data Reviewed: Comprehensive metabolic panel shows worsening total bilirubin, creatinine normal, electrolytes okay LFTs up slightly INR 2.2  Family Communication: None present    Disposition: Status is: Inpatient Stabilized.  Medically ready for discharge when bed available        Author: Edwin Dada, MD 01/19/2022 1:14 PM  For on call review [email or web address]

## 2022-01-19 NOTE — Progress Notes (Signed)
Physical Therapy Treatment Patient Details Name: Leroy Bell MRN: [id number] DOB: 1960-12-05 Today's Date: 01/19/2022   History of Present Illness Pt is a 61 yo male presenting to [facility] ED on 01/09/22 with complaints of nausea & vomiting, diarrhea found down. PMH: DM, HLD, HTN, ETOH abuse, hepatic cirrhosis.    PT Comments    General Comments: AxO x 3 improved and increased ability to follow multi step commands.  Still present with a delay and requires increased time. Assisted OOB required increased time.  General bed mobility comments: increased time.  Bradykinesia. Still present with impaired motor control.  Difficulty self scooting to EOB.General transfer comment: improving self ability but still present with delayed, uncoordinated movements with initial instability and poor reflex response.  General Gait Details: progressed from + 2 assist to + 1 but still present with ataxia. Pt present with short/shuffled steps.   Pt staggering R/L with delayed coorective reaction.  HIGH FALL RISK.  Performed a BERG balance trst which pt only scored a 20/56.   Pt would benefit from Inpt Rehab at address his mobility/balance deficits before safely returning home.   Recommendations for follow up therapy are one component of a multi-disciplinary discharge planning process, led by the attending physician.  Recommendations may be updated based on patient status, additional functional criteria and insurance authorization.  Follow Up Recommendations  Acute inpatient rehab (3hours/day)     Assistance Recommended at Discharge Intermittent Supervision/Assistance  Patient can return home with the following A little help with walking and/or transfers;A little help with bathing/dressing/bathroom;Assistance with cooking/housework;Assist for transportation;Help with stairs or ramp for entrance   Equipment Recommendations  None recommended by PT    Recommendations for Other Services       Precautions /  Restrictions Precautions Precautions: Fall Precaution Comments: ETOH ataxia Restrictions Weight Bearing Restrictions: No     Mobility  Bed Mobility Overal bed mobility: Needs Assistance       Supine to sit: Supervision     General bed mobility comments: increased time.  Bradykinesia. Still present with impaired motor control.  Difficulty self scooting to EOB.    Transfers Overall transfer level: Needs assistance Equipment used: None Transfers: Sit to/from Stand Sit to Stand: Min guard           General transfer comment: improving self ability but still present with delayed, uncoordinated movements with initial instability and poor reflex response.    Ambulation/Gait Ambulation/Gait assistance: Min assist, Mod assist Gait Distance (Feet): 250 Feet Assistive device: 1 person hand held assist Gait Pattern/deviations: Step-through pattern, Decreased step length - right, Decreased step length - left, Decreased stride length, Shuffle, Trunk flexed, Narrow base of support, Staggering left, Staggering right Gait velocity: decreased     General Gait Details: progressed from + 2 assist to + 1 but still present with ataxia. Pt present with short/shuffled steps.   Pt staggering R/L with delayed coorective reaction.  HIGH FALL RISK.   Stairs             Wheelchair Mobility    Modified Rankin (Stroke Patients Only)       Balance Overall balance assessment: Needs assistance                               Standardized Balance Assessment Standardized Balance Assessment : Berg Balance Test Berg Balance Test Sit to Stand: Able to stand using hands after several tries Standing Unsupported: Able to stand 2  minutes with supervision Sitting with Back Unsupported but Feet Supported on Floor or Stool: Able to sit safely and securely 2 minutes Stand to Sit: Needs assistance to sit Transfers: Needs one person to assist Standing Unsupported with Eyes Closed:  Unable to keep eyes closed 3 seconds but stays steady Standing Ubsupported with Feet Together: Needs help to attain position but able to stand for 30 seconds with feet together From Standing, Reach Forward with Outstretched Arm: Can reach forward >5 cm safely (2") From Standing Position, Pick up Object from Floor: Able to pick up shoe, needs supervision From Standing Position, Turn to Look Behind Over each Shoulder: Needs supervision when turning Turn 360 Degrees: Needs close supervision or verbal cueing Standing Unsupported, Alternately Place Feet on Step/Stool: Needs assistance to keep from falling or unable to try Standing Unsupported, One Foot in Front: Loses balance while stepping or standing Standing on One Leg: Tries to lift leg/unable to hold 3 seconds but remains standing independently Total Score: 20        Cognition Arousal/Alertness: Awake/alert Behavior During Therapy: WFL for tasks assessed/performed Overall Cognitive Status: Within Functional Limits for tasks assessed Area of Impairment: Following commands, Awareness, Problem solving, Safety/judgement, Attention                               General Comments: AxO x 3 improved and increased ability to follow multi step commands.  Still present with a delay and requires increased time.        Exercises      General Comments        Pertinent Vitals/Pain Pain Assessment Pain Assessment: No/denies pain    Home Living                          Prior Function            PT Goals (current goals can now be found in the care plan section) Progress towards PT goals: Progressing toward goals    Frequency    Min 3X/week      PT Plan Current plan remains appropriate    Co-evaluation              AM-PAC PT "6 Clicks" Mobility   Outcome Measure  Help needed turning from your back to your side while in a flat bed without using bedrails?: A Little Help needed moving from lying on  your back to sitting on the side of a flat bed without using bedrails?: A Little Help needed moving to and from a bed to a chair (including a wheelchair)?: A Little Help needed standing up from a chair using your arms (e.g., wheelchair or bedside chair)?: A Lot Help needed to walk in hospital room?: A Lot Help needed climbing 3-5 steps with a railing? : A Lot 6 Click Score: 15    End of Session   Activity Tolerance: No increased pain;Patient tolerated treatment well Patient left: in chair;with call bell/phone within reach;with chair alarm set;with family/visitor present Nurse Communication: Mobility status PT Visit Diagnosis: History of falling (Z91.81);Difficulty in walking, not elsewhere classified (R26.2);Unsteadiness on feet (R26.81)     Time: [id number] PT Time Calculation (min) (ACUTE ONLY): 17 min  Charges:  $Gait Training: 8-22 mins                     {Lauraine Crespo  PTA Acute  Rehabilitation [employer] M-F  [phone] Weekend pager [phone]

## 2022-01-19 NOTE — Plan of Care (Signed)

## 2022-01-19 NOTE — Progress Notes (Addendum)
Inpatient Rehab Admissions:  Inpatient Rehab Consult received.  I spoke with patient's wife Eustaquio Maize on the telephone for rehabilitation assessment and to discuss goals and expectations of an inpatient rehab admission.  Pt/family have changed their minds and would like to pursue CIR. Discussed average length of stay, visitation policy, insurance authorization requirement of pt's insurance, discharge home after completion of CIR. Wife acknowledged understanding. Wife confirmed that she and other family will be able to provide 24/7 support for pt after discharge. Will continue to follow.  ADDENDUM: Insurance authorization started at 76.  Signed: Gayland Curry, [place], [place] Admissions Coordinator [phone]

## 2022-01-20 DIAGNOSIS — N179 Acute kidney failure, unspecified: Secondary | ICD-10-CM | POA: Diagnosis not present

## 2022-01-20 DIAGNOSIS — F1020 Alcohol dependence, uncomplicated: Secondary | ICD-10-CM | POA: Diagnosis not present

## 2022-01-20 DIAGNOSIS — D62 Acute posthemorrhagic anemia: Secondary | ICD-10-CM | POA: Diagnosis not present

## 2022-01-20 DIAGNOSIS — K7030 Alcoholic cirrhosis of liver without ascites: Secondary | ICD-10-CM | POA: Diagnosis not present

## 2022-01-20 LAB — CBC
HCT: 34.2 % — ABNORMAL LOW (ref 39.0–52.0)
Hemoglobin: 11.2 g/dL — ABNORMAL LOW (ref 13.0–17.0)
MCH: 33.6 pg (ref 26.0–34.0)
MCHC: 32.7 g/dL (ref 30.0–36.0)
MCV: 102.7 fL — ABNORMAL HIGH (ref 80.0–100.0)
Platelets: 61 10*3/uL — ABNORMAL LOW (ref 150–400)
RBC: 3.33 MIL/uL — ABNORMAL LOW (ref 4.22–5.81)
RDW: 17.9 % — ABNORMAL HIGH (ref 11.5–15.5)
WBC: 10.2 10*3/uL (ref 4.0–10.5)
nRBC: 0 % (ref 0.0–0.2)

## 2022-01-20 LAB — COMPREHENSIVE METABOLIC PANEL
ALT: 118 U/L — ABNORMAL HIGH (ref 0–44)
AST: 137 U/L — ABNORMAL HIGH (ref 15–41)
Albumin: 2.1 g/dL — ABNORMAL LOW (ref 3.5–5.0)
Alkaline Phosphatase: 111 U/L (ref 38–126)
Anion gap: 5 (ref 5–15)
BUN: 33 mg/dL — ABNORMAL HIGH (ref 6–20)
CO2: 23 mmol/L (ref 22–32)
Calcium: 8.5 mg/dL — ABNORMAL LOW (ref 8.9–10.3)
Chloride: 107 mmol/L (ref 98–111)
Creatinine, Ser: 1.09 mg/dL (ref 0.61–1.24)
GFR, Estimated: >60 mL/min (ref >60)
Glucose, Bld: 232 mg/dL — ABNORMAL HIGH (ref 70–99)
Potassium: 5.3 mmol/L — ABNORMAL HIGH (ref 3.5–5.1)
Sodium: 135 mmol/L (ref 135–145)
Total Bilirubin: 19.7 mg/dL (ref 0.3–1.2)
Total Protein: 5.4 g/dL — ABNORMAL LOW (ref 6.5–8.1)

## 2022-01-20 LAB — GLUCOSE, CAPILLARY
Glucose-Capillary: 226 mg/dL — ABNORMAL HIGH (ref 70–99)
Glucose-Capillary: 186 mg/dL — ABNORMAL HIGH (ref 70–99)
Glucose-Capillary: 190 mg/dL — ABNORMAL HIGH (ref 70–99)
Glucose-Capillary: 204 mg/dL — ABNORMAL HIGH (ref 70–99)

## 2022-01-20 LAB — PROTIME-INR
INR: 2 — ABNORMAL HIGH (ref 0.8–1.2)
Prothrombin Time: 22.7 seconds — ABNORMAL HIGH (ref 11.4–15.2)

## 2022-01-20 NOTE — Progress Notes (Signed)
Occupational Therapy Treatment Patient Details Name: Leroy Bell MRN: [id number] DOB: 1960-05-02 Today's Date: 01/20/2022   History of present illness Pt is a 61 yo male presenting to [facility] ED on 01/09/22 with complaints of nausea & vomiting, diarrhea found down. PMH: DM, HLD, HTN, ETOH abuse, hepatic cirrhosis.   OT comments  Patient participated in therapeutic activity to increase functional activity tolerance for ADL tasks. Patient would continue to benefit from skilled OT services at this time while admitted and after d/c to address noted deficits in order to improve overall safety and independence in ADLs.     Recommendations for follow up therapy are one component of a multi-disciplinary discharge planning process, led by the attending physician.  Recommendations may be updated based on patient status, additional functional criteria and insurance authorization.    Follow Up Recommendations  Acute inpatient rehab (3hours/day)    Assistance Recommended at Discharge Frequent or constant Supervision/Assistance  Patient can return home with the following  A lot of help with bathing/dressing/bathroom;Direct supervision/assist for medications management;Direct supervision/assist for financial management;Assist for transportation;Assistance with cooking/housework;Help with stairs or ramp for entrance;A little help with walking and/or transfers   Equipment Recommendations  Other (comment)    Recommendations for Other Services Rehab consult    Precautions / Restrictions Precautions Precautions: Fall Precaution Comments: ETOH ataxia Restrictions Weight Bearing Restrictions: No       Mobility Bed Mobility Overal bed mobility: Needs Assistance Bed Mobility: Supine to Sit     Supine to sit: Supervision          Transfers                         Balance                                           ADL either performed or assessed with clinical  judgement   ADL Overall ADL's : Needs assistance/impaired                     Lower Body Dressing: Minimal assistance Lower Body Dressing Details (indicate cue type and reason): patient was min A to don pants sitting EOB and standing to pull up over hips. patient was mod A to don shoes with patietn able to don L shoe but needing increased assistance for R shoe               General ADL Comments: patient was min guard with RW to participate in functional mobility in hallway to increase functional activity tolerance for ADL tasks.    Extremity/Trunk Assessment              Vision       Perception     Praxis      Cognition Arousal/Alertness: Awake/alert Behavior During Therapy: WFL for tasks assessed/performed Overall Cognitive Status: Within Functional Limits for tasks assessed                                 General Comments: able to follow multi step commands with education provided. needs increased time to process        Exercises Other Exercises Other Exercises: patient completed 10 reps to chair pushups and 10 reps of sit to stands to increase functional activity tolerance with education and  sequencing cues provided at start and able to be carried over during session    Shoulder Instructions       General Comments      Pertinent Vitals/ Pain       Pain Assessment Pain Assessment: No/denies pain  Home Living                                          Prior Functioning/Environment              Frequency  Min 2X/week        Progress Toward Goals  OT Goals(current goals can now be found in the care plan section)  Progress towards OT goals: Progressing toward goals     Plan Discharge plan needs to be updated    Co-evaluation                 AM-PAC OT "6 Clicks" Daily Activity     Outcome Measure   Help from another person eating meals?: A Little Help from another person taking care of  personal grooming?: A Little Help from another person toileting, which includes using toliet, bedpan, or urinal?: A Little Help from another person bathing (including washing, rinsing, drying)?: A Lot Help from another person to put on and taking off regular upper body clothing?: A Little Help from another person to put on and taking off regular lower body clothing?: A Lot 6 Click Score: 16    End of Session Equipment Utilized During Treatment: Rolling walker (2 wheels)  OT Visit Diagnosis: Unsteadiness on feet (R26.81);Other abnormalities of gait and mobility (R26.89);Pain   Activity Tolerance Patient tolerated treatment well   Patient Left in chair;with call bell/phone within reach;with family/visitor present   Nurse Communication Mobility status        Time: 1610-9604 OT Time Calculation (min): 26 min  Charges: OT General Charges $OT Visit: 1 Visit OT Treatments $Therapeutic Activity: 23-37 mins  [name], MS Acute Rehabilitation Department Office# [phone]   [name] 01/20/2022, 3:59 PM

## 2022-01-20 NOTE — Progress Notes (Signed)
Inpatient Rehabilitation Admissions Coordinator   I await insurance approval for a possible Cir admit pending approval.  Danne Baxter, RN, MSN Rehab Admissions Coordinator [phone] 01/20/2022 8:30 AM

## 2022-01-20 NOTE — Progress Notes (Signed)
Progress Note   Patient: Leroy Bell [id number] DOB: Jul 26, 1960 DOA: 01/09/2022     11 DOS: the patient was seen and examined on 01/20/2022 at 9:41AM      Brief hospital course: Mr. Imbert is a 61 y.o. M with alcohol dependence, cirrhosis, pHTN, HTN, IDA, OSA on CPAP who presented after being found down after a few days of N/V/D and dark stools.   In the ER, seemed hemodynamically stable, admitted for GI bleed and alcohol withdrawal.  Hgb 5.5 in the ER.    9/15: Admitted on PPI, octreotide, GI consulted, BP dropped overnight, transferred to stepdown 9/17: Developing delirium 9/18: Tbili rising to 14 9/19: Mentation improving 9/20: EGD showed esophagitis, small varices, GAVE; octreotide stopped; bradycardia with propranol; steroids started 9/21: Remains delirious at times; Rocephin stopped 9/23: Delirium improved, transferred [place] 9/25: Medically stabilized pending CIR     Assessment and Plan: * Hemorrhagic shock due to GI bleed Acute blood loss anemia S/p 3u PRBCs transfusion 9/16 EGD delayed due to delirium, thankfully bleeding stopped. S/p EGD on 9/20 --> small varices, erosive esophagitis and portal gastropathy, any of which could have been source of bleeding.   Octreotide stopped post-EGD Hgb remains fairly stable - Continue PO PPI - Consult GI, appreciate cares    Alcoholic cirrhosis of liver without ascites ([name]) Alcoholic hepatitis Thrombocytopenia MELD >25, not transplant candidate due to active alcohol at admission Tbili has trended up.  Discussed with GI 9/26, as steroids have failed and there are no other disease specific therapies for his liver, we will just have to trend his Tbili and INR and albumin and hope that this improves. - Check Tbili, INR twice weekly in rehab - Outpatient follow up with [facility]  At 7 days, no improvement in liver indices, but will follow GI recommendations for 8 weeks prednisolone - Continue prednisolone   Esophageal  varices   Propranolol caused bradycardia when initially started post-EGD In the last 3 days, has tolerated low dose carvedilol well  - Continue carvedilol   Delirium tremens   Alcohol use disorder, severe, dependence Resolved.  Complete alcohol cessation recommended - Continue thaimine and folate  Controlled type 2 diabetes mellitus without complication, without long-term current use of insulin ([name]) A1c 6.7% in the past, not on meds at home.  Here, hyperglycemia noted with prednisolone - Continue Semglee at discharge - Continue SS corrections     Stable or resolved issues: Acute renal failure (ARF) ([name]) Creatinine 2.08 on admit compared to baseline 0.7-0.8, resolved with fluids.   OSA on CPAP - CPAP             Subjective: Patient has no complaints.  He is more jaundiced today.  He has no confusion, vomiting, itching, malodorous, fever.     Physical Exam: BP 139/74 (BP Location: Right Arm)   Pulse 60   Temp 98.3 F (36.8 C)   Resp 19   Ht 5\' 7"  (1.702 m)   Wt 89.1 kg   SpO2 97%   BMI 30.77 kg/m   Adult male, lying in bed, interactive and appropriate, sclera icteric, moderate skin jaundice RRR, no murmurs, no peripheral edema Respiratory rate normal, lungs clear without rales or wheezes Abdomen soft without tenderness palpation or guarding currently I do not appreciate much ascites Attention normal, affect appropriate, judgment insight appear normal    Data Reviewed: Discussed with GI Basic metabolic panel shows potassium up to 5.3, creatinine normal AST and ALT are trending up INR 2, total  bilirubin up to 19 Platelets down to 61 Glucose up      Disposition: Status is: Inpatient The patient presented with alcohol delirium, melena from possible variceal bleed.  He is medically stable for discharge to rehab when bed available Will need close follow up of LFTs, bili, INR at rehab  If not discharged to [facility], would recmomend  holding for 2 days to monitor Tbili longer.  If discharged to [facility] and Tbili, LFTS, INR worsening, would reinvovle [facility] for prognosis        Author: Edwin Dada, MD 01/20/2022 4:10 PM  For on call review [email or web address].

## 2022-01-21 DIAGNOSIS — R578 Other shock: Secondary | ICD-10-CM | POA: Diagnosis not present

## 2022-01-21 LAB — CBC
HCT: 36.6 % — ABNORMAL LOW (ref 39.0–52.0)
Hemoglobin: 12 g/dL — ABNORMAL LOW (ref 13.0–17.0)
MCH: 34.1 pg — ABNORMAL HIGH (ref 26.0–34.0)
MCHC: 32.8 g/dL (ref 30.0–36.0)
MCV: 104 fL — ABNORMAL HIGH (ref 80.0–100.0)
Platelets: 65 10*3/uL — ABNORMAL LOW (ref 150–400)
RBC: 3.52 MIL/uL — ABNORMAL LOW (ref 4.22–5.81)
RDW: 18.1 % — ABNORMAL HIGH (ref 11.5–15.5)
WBC: 15.1 10*3/uL — ABNORMAL HIGH (ref 4.0–10.5)
nRBC: 0 % (ref 0.0–0.2)

## 2022-01-21 LAB — GLUCOSE, CAPILLARY
Glucose-Capillary: 202 mg/dL — ABNORMAL HIGH (ref 70–99)
Glucose-Capillary: 192 mg/dL — ABNORMAL HIGH (ref 70–99)

## 2022-01-21 LAB — COMPREHENSIVE METABOLIC PANEL
ALT: 135 U/L — ABNORMAL HIGH (ref 0–44)
AST: 153 U/L — ABNORMAL HIGH (ref 15–41)
Albumin: 2.1 g/dL — ABNORMAL LOW (ref 3.5–5.0)
Alkaline Phosphatase: 114 U/L (ref 38–126)
Anion gap: 8 (ref 5–15)
BUN: 38 mg/dL — ABNORMAL HIGH (ref 6–20)
CO2: 20 mmol/L — ABNORMAL LOW (ref 22–32)
Calcium: 8.1 mg/dL — ABNORMAL LOW (ref 8.9–10.3)
Chloride: 103 mmol/L (ref 98–111)
Creatinine, Ser: 1.04 mg/dL (ref 0.61–1.24)
GFR, Estimated: >60 mL/min (ref >60)
Glucose, Bld: 205 mg/dL — ABNORMAL HIGH (ref 70–99)
Potassium: 5.6 mmol/L — ABNORMAL HIGH (ref 3.5–5.1)
Sodium: 131 mmol/L — ABNORMAL LOW (ref 135–145)
Total Bilirubin: 23.3 mg/dL (ref 0.3–1.2)
Total Protein: 5.5 g/dL — ABNORMAL LOW (ref 6.5–8.1)

## 2022-01-21 LAB — HEMOGLOBIN A1C
Hgb A1c MFr Bld: 5.9 % — ABNORMAL HIGH (ref 4.8–5.6)
Mean Plasma Glucose: 122.63 mg/dL

## 2022-01-21 MED ORDER — SODIUM ZIRCONIUM CYCLOSILICATE 10 G PO PACK
10.0000 g | PACK | Freq: Once | ORAL | Status: DC
  Filled 2022-01-21: qty 1

## 2022-01-21 MED ORDER — FOLIC ACID 1 MG PO TABS: 1.0000 mg | ORAL_TABLET | Freq: Every day | ORAL | 2 refills | Status: AC

## 2022-01-21 MED ORDER — THIAMINE HCL 100 MG PO TABS: 100.0000 mg | ORAL_TABLET | Freq: Every day | ORAL | 2 refills | Status: AC

## 2022-01-21 MED ORDER — CARVEDILOL 3.125 MG PO TABS: 3.1250 mg | ORAL_TABLET | Freq: Two times a day (BID) | ORAL | 2 refills | Status: AC

## 2022-01-21 MED ORDER — "PEN NEEDLES 3/16"" 31G X 5 MM MISC": 2 refills | Status: AC

## 2022-01-21 MED ORDER — BLOOD GLUCOSE METER KIT: PACK | 0 refills | Status: AC

## 2022-01-21 MED ORDER — RIFAXIMIN 550 MG PO TABS: 550.0000 mg | ORAL_TABLET | Freq: Two times a day (BID) | ORAL | 2 refills | Status: AC

## 2022-01-21 MED ORDER — PREDNISOLONE 5 MG PO TABS: 40.0000 mg | ORAL_TABLET | Freq: Every day | ORAL | 0 refills | Status: DC

## 2022-01-21 MED ORDER — INSULIN GLARGINE-YFGN 100 UNIT/ML ~~LOC~~ SOLN: 20.0000 [IU] | Freq: Every day | SUBCUTANEOUS | Status: DC

## 2022-01-21 MED ORDER — ADULT MULTIVITAMIN W/MINERALS CH: 1.0000 | ORAL_TABLET | Freq: Every day | ORAL | Status: AC

## 2022-01-21 MED ORDER — INSULIN DETEMIR 100 UNIT/ML FLEXPEN: 20.0000 [IU] | PEN_INJECTOR | Freq: Every day | SUBCUTANEOUS | 2 refills | Status: AC

## 2022-01-21 MED ORDER — PANTOPRAZOLE SODIUM 40 MG PO TBEC: 40.0000 mg | DELAYED_RELEASE_TABLET | Freq: Two times a day (BID) | ORAL | 2 refills | Status: AC

## 2022-01-21 NOTE — TOC Progression Note (Signed)
Transition of Care [facility]) - Progression Note    Patient Details  Name: Leroy Bell MRN: [id number] Date of Birth: 03-04-1961  Transition of [facility]) CM/SW [place], LCSW Phone Number: 01/21/2022, 11:14 AM  Clinical Narrative:    Pt is no longer requiring CIR level of care. Current plan is for pt to return home w/ HEP and assistance from family.    Expected Discharge Plan: IP Rehab Facility Barriers to Discharge: Continued Medical Work up  Expected Discharge Plan and Services Expected Discharge Plan: [place]       Living arrangements for the past 2 months: Single Family Home                                       Social Determinants of Health (SDOH) Interventions    Readmission Risk Interventions     No data to display

## 2022-01-21 NOTE — Progress Notes (Addendum)
Inpatient Rehabilitation Admissions Coordinator   I spoke to pt's wife by phone at his bedside. She states she has showered patient this morning and ambulating him in the room herself.She reports his  cognition is at baseline. I questioned his need for CIR admit at this time. I contacted Sharron, therapy supervisor to request therapy re eval today to assist in determining need for CIR vs direct discharge home. I await that assessment to proceed further. Acute team made aware.  Danne Baxter, RN, MSN Rehab Admissions Coordinator [phone] 01/21/2022 10:19 AM  Notified by Gatha Mayer with therapy and then spoke with wife by phone. The plan is for direct discharge home. No need for Cir level therapy at this time. We will sign off.  Danne Baxter, RN, MSN Rehab Admissions Coordinator [phone] 01/21/2022 11:14 AM

## 2022-01-21 NOTE — Discharge Summary (Signed)
Physician Discharge Summary  [name] [id number] DOB: May 11, 1960 DOA: 01/09/2022  PCP: Janith Lima, MD  Admit date: 01/09/2022 Discharge date: 01/21/2022  Admitted From: Home Disposition: Home  Recommendations for Outpatient Follow-up:  Follow up with PCP in 1-2 weeks Follow-up with gastroenterology, Dr. Paulita Fujita 1 week Please obtain CMP/CBC in one week Continue to encourage complete alcohol cessation  Home Health: No Equipment/Devices: None  Discharge Condition: Stable, but overall guarded given his hepatic impairment CODE STATUS: Full code Diet recommendation: Heart healthy diet  History of present illness:  Leroy Bell is a 61 year old male with past medical history significant for EtOH cirrhosis, EtOH dependence, essential hypertension, iron deficiency anemia, OSA on CPAP who presented to [facility] ED on 9/15 after being found down.  Apparently the last few days he was complaining of nausea/vomiting/diarrhea and dark stools.  Upon evaluation in the ED, he was hemodynamically stable and admitted under the hospitalist service for GI bleed and alcohol withdrawal with hemoglobin of 5.5 in the ED.  Hospital course:  Hemorrhagic shock 2/2 GI bleed Acute blood loss anemia Patient hemoglobin 5.5 on arrival.  Transfused 3 units PRBCs.  Was placed on octreotide and Protonix drip.  GI was consulted and underwent EGD notable for small varices, erosive esophagitis and portal gastropathy.  GI recommended starting low-dose carvedilol 3.125 mg twice daily, Protonix 40 mg p.o. twice daily.  Hemoglobin remained stable throughout the duration of his hospitalization and was 12.0 at time of discharge.  Recommend repeat CBC 1 week.  Outpatient follow-up with gastroenterology.  Alcoholic cirrhosis of the liver without ascites Alcoholic hepatitis Thrombocytopenia MELD score calculated greater than 25 on admission, not a transplant candidate due to active alcohol consumption at admission.   Seen by gastroenterology during hospitalization and started on prednisolone 40 mg p.o. daily.  Although LFTs and bilirubin continues to trend up and GI recommended outpatient follow-up with likely 8 weeks of prednisolone on discharge.  Will need repeat CMP 1 week, outpatient follow-up with gastroenterology, Dr. Paulita Fujita.  Discussed alcohol cessation with patient and family.  Overall very guarded/poor prognosis if hepatic function does not improve.  Esophageal varices Noted on EGD.  Continue carvedilol 3.125 mg p.o. twice daily.  Outpatient follow-up with gastroenterology.  EtOH withdrawal with delirium tremens EtOH use disorder, severe with dependence During hospitalization patient underwent alcohol withdrawal which was supported with IV lorazepam.  Now resolved.  Discussed complete alcohol cessation.  Continue multivitamin, thiamine, folate.  Type 2 diabetes mellitus without complication, without long-term current use of insulin Hemoglobin A1c 6.7 in the past, not currently on medication at home.  Hyperglycemia during hospitalization complicated by use of steroids; prednisolone.  Continue Lantus 20 incipiently daily on discharge; especially while on steroids.  Outpatient follow-up with PCP.  Acute renal failure Creatinine on admission 2.08, baseline 0.7-0.8.  Support with IV fluids with improvement back to his typical baseline.  OSA Continue nocturnal CPAP  Weakness/debility/gait disturbance: In the delay seen by PT/OT with recommendations of CIR on discharge.  Patient progressed fairly well during hospitalization and now updated to outpatient therapy which patient and family declined and will continue to do therapy on their own at home.  Discharge Diagnoses:  Principal Problem:   Hemorrhagic shock due to GI bleed Active Problems:   Delirium tremens ([name])   Alcohol use disorder, severe, dependence ([name])   OSA on CPAP   Alcoholic cirrhosis of liver without ascites ([name])   Acute renal  failure (ARF) ([name])   Acute blood loss anemia  Thrombocytopenia ([place])   Esophageal varices ([name])   Controlled type 2 diabetes mellitus without complication, without long-term current use of insulin [facility])    Discharge Instructions  Discharge Instructions     Call MD for:  difficulty breathing, headache or visual disturbances   Complete by: As directed    Call MD for:  extreme fatigue   Complete by: As directed    Call MD for:  persistant dizziness or light-headedness   Complete by: As directed    Call MD for:  persistant nausea and vomiting   Complete by: As directed    Call MD for:  severe uncontrolled pain   Complete by: As directed    Call MD for:  temperature >100.4   Complete by: As directed    Diet - low sodium heart healthy   Complete by: As directed    Increase activity slowly   Complete by: As directed       Allergies as of 01/21/2022       Reactions   Penicillins Hives        Medication List     STOP taking these medications    atorvastatin 10 MG tablet Commonly known as: LIPITOR   nebivolol 10 MG tablet Commonly known as: BYSTOLIC   olmesartan 20 MG tablet Commonly known as: BENICAR       TAKE these medications    blood glucose meter kit and supplies Dispense based on patient and insurance preference. Use up to four times daily as directed. (FOR ICD-10 E10.9, E11.9).   carvedilol 3.125 MG tablet Commonly known as: COREG Take 1 tablet (3.125 mg total) by mouth 2 (two) times daily with a meal.   folic acid 1 MG tablet Commonly known as: FOLVITE Take 1 tablet (1 mg total) by mouth daily. Start taking on: January 22, 2022   insulin detemir 100 UNIT/ML FlexPen Commonly known as: LEVEMIR Inject 20 Units into the skin daily.   multivitamin with minerals Tabs tablet Take 1 tablet by mouth daily. Start taking on: January 22, 2022   pantoprazole 40 MG tablet Commonly known as: PROTONIX Take 1 tablet (40 mg total) by mouth 2 (two)  times daily.   Pen Needles 3/16" 31G X 5 MM Misc Use as directed with insulin pen   prednisoLONE 5 MG Tabs tablet Take 8 tablets (40 mg total) by mouth daily with breakfast for 28 days. Start taking on: January 22, 2022   rifaximin 550 MG Tabs tablet Commonly known as: XIFAXAN Take 1 tablet (550 mg total) by mouth 2 (two) times daily.   thiamine 100 MG tablet Commonly known as: CVS B-1 Take 1 tablet (100 mg total) by mouth daily.        Follow-up Information     Janith Lima, MD. Schedule an appointment as soon as possible for a visit in 1 week(s).   Specialty: Internal Medicine Contact information: [place] [address] [phone]         Arta Silence, MD. Schedule an appointment as soon as possible for a visit in 1 week(s).   Specialty: Gastroenterology Contact information: [address] [place] [address] [phone]                Allergies  Allergen Reactions   Penicillins Hives    Consultations: [facility] gastroenterology, Dr. Paulita Fujita   Procedures/Studies: DG CHEST PORT 1 VIEW  Result Date: 01/12/2022 CLINICAL DATA:  Shortness of breath. EXAM: PORTABLE CHEST 1 VIEW  COMPARISON:  07/28/2018 FINDINGS: The cardiac silhouette, mediastinal and hilar contours are within normal limits and stable. Moderate stable eventration of the left hemidiaphragm. Low lung volumes with mild vascular crowding and streaky atelectasis. There is also mild central vascular congestion but no overt pulmonary edema pleural effusions. IMPRESSION: 1. Low lung volumes with vascular crowding and streaky atelectasis. 2. Mild central vascular congestion but no overt pulmonary edema or pleural effusions. Electronically Signed   By: Marijo Sanes M.D.   On: 01/12/2022 11:11   CT Head Wo Contrast  Result Date: 01/09/2022 CLINICAL DATA:  Altered mental status EXAM: CT HEAD WITHOUT CONTRAST TECHNIQUE: Contiguous axial images were obtained from  the base of the skull through the vertex without intravenous contrast. RADIATION DOSE REDUCTION: This exam was performed according to the departmental dose-optimization program which includes automated exposure control, adjustment of the mA and/or kV according to patient size and/or use of iterative reconstruction technique. COMPARISON:  None Available. FINDINGS: Brain: No acute intracranial findings are seen. There are no signs of bleeding within the cranium. Cortical sulci are prominent. There is no focal edema or mass effect. Vascular: Unremarkable. Skull: Unremarkable. Sinuses/Orbits: There is mild mucosal thickening in ethmoid and left maxillary sinuses. Other: None. IMPRESSION: No acute intracranial findings are seen in noncontrast CT brain. Atrophy. Mild chronic sinusitis. Electronically Signed   By: Elmer Picker M.D.   On: 01/09/2022 19:23     Subjective: Patient seen examined bedside, resting comfortably.  Family present.  Desires to discharge home, now has progressed and does not require CIR level of care on discharge.  Discussed with patient and family regarding complete alcohol cessation and needs close follow-up with PCP/GI.  No other questions or concerns at this time.  Denies headache, no dizziness, no chest pain, no palpitations, no shortness of breath, no abdominal pain, no focal weakness, no fever/chills/night sweats, no nausea cefonicid diarrhea, no paresthesias.  No acute events overnight per nursing staff.  Discharge Exam: Vitals:   01/20/22 0503 01/20/22 2011  BP: 139/74 (!) 141/77  Pulse: 60 65  Resp: 19 18  Temp:  97.9 F (36.6 C)  SpO2: 97% 98%   Vitals:   01/19/22 1355 01/19/22 2041 01/20/22 0503 01/20/22 2011  BP: 129/72 135/66 139/74 (!) 141/77  Pulse: 60 63 60 65  Resp: 18 17 19 18   Temp: 97.7 F (36.5 C) 98.3 F (36.8 C)  97.9 F (36.6 C)  TempSrc: Oral     SpO2: 100% 98% 97% 98%  Weight:   89.1 kg   Height:        Physical Exam: GEN: NAD, alert  and oriented x 3, chronically ill in appearance, jaundiced HEENT: NCAT, PERRL, EOMI, scleral icterus noted, MMM PULM: CTAB w/o wheezes/crackles, normal respiratory effort, room air CV: RRR w/o M/G/R GI: abd soft, NTND, NABS, no R/G/M MSK: no peripheral edema, muscle strength globally intact 5/5 bilateral upper/lower extremities NEURO: CN II-XII intact, no focal deficits, sensation to light touch intact PSYCH: normal mood/affect Integumentary: Skin notably jaundiced, otherwise no other concerning rashes/lesions/wounds on exposed skin surfaces.      The results of significant diagnostics from this hospitalization (including imaging, microbiology, ancillary and laboratory) are listed below for reference.     Microbiology: No results found for this or any previous visit (from the past 240 hour(s)).   Labs: BNP (last 3 results) No results for input(s): "BNP" in the last 8760 hours. Basic Metabolic Panel: Recent Labs  Lab 01/16/22 0221 01/17/22 0248 01/19/22 0458 01/20/22  0534 01/21/22 0545  NA 138 137 136 135 131*  K 5.1 4.9 5.0 5.3* 5.6*  CL 112* 109 108 107 103  CO2 21* 23 22 23  20*  GLUCOSE 279* 202* 265* 232* 205*  BUN 37* 31* 32* 33* 38*  CREATININE 1.30* 1.16 1.03 1.09 1.04  CALCIUM 7.9* 8.0* 8.3* 8.5* 8.1*   Liver Function Tests: Recent Labs  Lab 01/16/22 0221 01/17/22 0248 01/19/22 0458 01/20/22 0534 01/21/22 0545  AST 105* 103* 123* 137* 153*  ALT 64* 72* 100* 118* 135*  ALKPHOS 77 82 108 111 114  BILITOT 15.8* 14.6* 17.1* 19.7* 23.3*  PROT 5.5* 5.3* 5.5* 5.4* 5.5*  ALBUMIN 2.3* 2.2* 2.2* 2.1* 2.1*   No results for input(s): "LIPASE", "AMYLASE" in the last 168 hours. No results for input(s): "AMMONIA" in the last 168 hours. CBC: Recent Labs  Lab 01/15/22 0332 01/16/22 0221 01/20/22 0534 01/21/22 0545  WBC 4.9 8.0 10.2 15.1*  NEUTROABS 4.5 6.9  --   --   HGB 11.6* 10.9* 11.2* 12.0*  HCT 35.7* 33.2* 34.2* 36.6*  MCV 102.9* 103.4* 102.7* 104.0*  PLT  97* 91* 61* 65*   Cardiac Enzymes: No results for input(s): "CKTOTAL", "CKMB", "CKMBINDEX", "TROPONINI" in the last 168 hours. BNP: Invalid input(s): "POCBNP" CBG: Recent Labs  Lab 01/20/22 0737 01/20/22 1145 01/20/22 1742 01/20/22 2008 01/21/22 0834  GLUCAP 226* 186* 190* 204* 202*   D-Dimer No results for input(s): "DDIMER" in the last 72 hours. Hgb A1c No results for input(s): "HGBA1C" in the last 72 hours. Lipid Profile No results for input(s): "CHOL", "HDL", "LDLCALC", "TRIG", "CHOLHDL", "LDLDIRECT" in the last 72 hours. Thyroid function studies No results for input(s): "TSH", "T4TOTAL", "T3FREE", "THYROIDAB" in the last 72 hours.  Invalid input(s): "FREET3" Anemia work up No results for input(s): "VITAMINB12", "FOLATE", "FERRITIN", "TIBC", "IRON", "RETICCTPCT" in the last 72 hours. Urinalysis    Component Value Date/Time   COLORURINE AMBER (A) 01/15/2022 0932   APPEARANCEUR HAZY (A) 01/15/2022 0932   LABSPEC 1.026 01/15/2022 0932   PHURINE 5.0 01/15/2022 0932   GLUCOSEU 150 (A) 01/15/2022 0932   GLUCOSEU NEGATIVE 01/29/2021 1406   HGBUR SMALL (A) 01/15/2022 0932   BILIRUBINUR SMALL (A) 01/15/2022 0932   KETONESUR NEGATIVE 01/15/2022 0932   PROTEINUR 30 (A) 01/15/2022 0932   UROBILINOGEN 2.0 (A) 01/29/2021 1406   NITRITE NEGATIVE 01/15/2022 0932   LEUKOCYTESUR NEGATIVE 01/15/2022 0932   Sepsis Labs Recent Labs  Lab 01/15/22 0332 01/16/22 0221 01/20/22 0534 01/21/22 0545  WBC 4.9 8.0 10.2 15.1*   Microbiology No results found for this or any previous visit (from the past 240 hour(s)).   Time coordinating discharge: Over 30 minutes  SIGNED:   Donnamarie Poag [place] DO  [facility] 01/21/2022, 11:23 AM

## 2022-01-21 NOTE — Plan of Care (Signed)
Patient appropriate for discharge, he is stable with family at the bedside

## 2022-01-21 NOTE — Progress Notes (Signed)
Patient stable, in no distress. AVS reviewed with the patient and his family in which they verbalized understanding.  IV line removed and patient transported down by [employer]

## 2022-01-21 NOTE — Progress Notes (Signed)
Physical Therapy Treatment Patient Details Name: Leroy Bell MRN: [id number] DOB: 08/31/1960 Today's Date: 01/21/2022   History of Present Illness Pt is a 61 yo male presenting to [facility] ED on 01/09/22 with complaints of nausea & vomiting, diarrhea found down. PMH: DM, HLD, HTN, ETOH abuse, hepatic cirrhosis.    PT Comments    Pt continues to progress while here. He performed a shower with the assistance of his wife this morning. That really fatigued him a bit and his endurance and activity tolerance is still very low. He still exhibits balance deficits, however with straight walking and using RW he is more supervision at this time. Without the RW he still requires MinA to steady at times. Family and pt aware to progress on RW still to get balance and activity tolerance improved. Educated with standing exercises and UE theraband exercises for home progression, as well as supervision with RW for walks in driveway. Gait belt provided for safety. Pt and family comfortable with going home at this point and feels he will not longer need inpatient rehab for rehab. PT agrees as well.   HEP code through [employer]: Access Code: [email or web address]     Recommendations for follow up therapy are one component of a multi-disciplinary discharge planning process, led by the attending physician.  Recommendations may be updated based on patient status, additional functional criteria and insurance authorization.  Follow Up Recommendations  Outpatient PT (discussed with family as a good optionhowever they want to try improving at home first and will ask PCP if need OPPT referral)     Assistance Recommended at Discharge Frequent or constant Supervision/Assistance  Patient can return home with the following A little help with bathing/dressing/bathroom;A little help with walking and/or transfers;Assistance with cooking/housework;Help with stairs or ramp for entrance   Equipment Recommendations  None recommended by PT (wife  already got RW for home)    Recommendations for Other Services       Precautions / Restrictions Precautions Precautions: Fall Precaution Comments: ETOH ataxia Restrictions Weight Bearing Restrictions: No     Mobility  Bed Mobility Overal bed mobility: Modified Independent Bed Mobility: Supine to Sit, Sit to Supine     Supine to sit: Modified independent (Device/Increase time)          Transfers Overall transfer level: Needs assistance Equipment used: Rolling walker (2 wheels) Transfers: Sit to/from Stand Sit to Stand: Min guard                Ambulation/Gait Ambulation/Gait assistance: Min guard Gait Distance (Feet): 350 Feet Assistive device: Rolling walker (2 wheels)         General Gait Details: transitioned from RW to hand held asssist and tolerated well, just not ready for complete indepence yet.   Stairs             Wheelchair Mobility    Modified Rankin (Stroke Patients Only)       Balance Overall balance assessment: Needs assistance Sitting-balance support: Feet supported Sitting balance-[name]: Good     Standing balance support: Reliant on assistive device for balance, During functional activity Standing balance-[name]: Fair                              Cognition Arousal/Alertness: Awake/alert Behavior During Therapy: WFL for tasks assessed/performed Overall Cognitive Status: Within Functional Limits for tasks assessed  General Comments: able to follow multi step commands with education provided. needs increased time to process        Exercises      General Comments        Pertinent Vitals/Pain Pain Assessment Pain Assessment: No/denies pain    Home Living                          Prior Function            PT Goals (current goals can now be found in the care plan section) Acute Rehab PT Goals Patient Stated Goal: To go home PT  Goal Formulation: With patient Time For Goal Achievement: 01/27/22 Potential to Achieve Goals: Good Progress towards PT goals: Progressing toward goals    Frequency    Min 3X/week      PT Plan Discharge plan needs to be updated    Co-evaluation              AM-PAC PT "6 Clicks" Mobility   Outcome Measure  Help needed turning from your back to your side while in a flat bed without using bedrails?: None Help needed moving from lying on your back to sitting on the side of a flat bed without using bedrails?: None Help needed moving to and from a bed to a chair (including a wheelchair)?: A Little Help needed standing up from a chair using your arms (e.g., wheelchair or bedside chair)?: A Little Help needed to walk in hospital room?: A Little Help needed climbing 3-5 steps with a railing? : A Little 6 Click Score: 20    End of Session Equipment Utilized During Treatment: Gait belt Activity Tolerance: Patient tolerated treatment well Patient left: in bed Nurse Communication: Mobility status PT Visit Diagnosis: Other abnormalities of gait and mobility (R26.89);Muscle weakness (generalized) (M62.81)     Time: 1025-8527 PT Time Calculation (min) (ACUTE ONLY): 25 min  Charges:  $Gait Training: 8-22 mins $Therapeutic Exercise: 8-22 mins                     Leroy Bell, PT, MPT Acute Rehabilitation Services Office: [phone] If a weekend: [facility] w/e pager [phone] 01/21/2022    Leroy Bell 01/21/2022, 11:35 AM

## 2022-01-22 ENCOUNTER — Telehealth: Payer: Self-pay

## 2022-01-22 NOTE — Telephone Encounter (Signed)
Transition Care Management Unsuccessful Follow-up Telephone Call  Date of discharge and from where:  01/21/2022; [facility]  Attempts:  1st Attempt  Reason for unsuccessful TCM follow-up call:  Voice mail full

## 2022-01-22 NOTE — Telephone Encounter (Signed)
Transition Care Management Follow-up Telephone Call Date of discharge and from where: 01/21/2022 from [facility] Diagnosis: N17.9 Acute Renal Failure How have you been since you were released from the hospital? Per wife: "Not good, he is not eating much and he is in terrible abdominal pain.  We just picked up the Protonix rx today." Any questions or concerns? No  Items Reviewed: Did the pt receive and understand the discharge instructions provided? Yes  Medications obtained and verified? Yes  Other? No  Any new allergies since your discharge? No  Dietary orders reviewed? Yes; heart healthy diet Do you have support at home? Yes ; wife  Home Care and Equipment/Supplies: Were home health services ordered? no If so, what is the name of the agency? no  Has the agency set up a time to come to the patient's home? not applicable Were any new equipment or medical supplies ordered?  Yes: walker What is the name of the medical supply agency? N/a Were you able to get the supplies/equipment? yes Do you have any questions related to the use of the equipment or supplies? No  Functional Questionnaire: (I = Independent and D = Dependent) ADLs: D  Bathing/Dressing- D  Meal Prep- D  Eating- I  Maintaining continence- I  Transferring/Ambulation- D  Managing Meds- D  Follow up appointments reviewed:  PCP Hospital f/u appt confirmed? Yes  Scheduled to see Scarlette Calico, MD on 01/27/2022 @ 10:40 am. [facility] f/u appt confirmed? Yes  Scheduled to see Dr. Arta Silence  Are transportation arrangements needed? No  If their condition worsens, is the pt aware to call PCP or go to the Emergency Dept.? Yes Was the patient provided with contact information for the PCP's office or ED? Yes Was to pt encouraged to call back with questions or concerns? Yes

## 2022-01-23 ENCOUNTER — Other Ambulatory Visit (HOSPITAL_COMMUNITY): Payer: Self-pay

## 2022-01-23 ENCOUNTER — Other Ambulatory Visit: Payer: Self-pay | Admitting: Internal Medicine

## 2022-01-23 MED ORDER — PREDNISONE 20 MG PO TABS: 40.0000 mg | ORAL_TABLET | Freq: Every day | ORAL | 0 refills | Status: AC

## 2022-01-24 ENCOUNTER — Other Ambulatory Visit: Payer: Self-pay

## 2022-01-24 ENCOUNTER — Inpatient Hospital Stay (HOSPITAL_COMMUNITY): Payer: Managed Care, Other (non HMO)

## 2022-01-24 ENCOUNTER — Inpatient Hospital Stay (HOSPITAL_COMMUNITY)
Admission: EM | Admit: 2022-01-24 | Discharge: 2022-02-25 | DRG: 377 | Disposition: E | Payer: Managed Care, Other (non HMO) | Attending: Pulmonary Disease | Admitting: Pulmonary Disease

## 2022-01-24 ENCOUNTER — Encounter (HOSPITAL_COMMUNITY): Payer: Self-pay

## 2022-01-24 DIAGNOSIS — K767 Hepatorenal syndrome: Secondary | ICD-10-CM | POA: Diagnosis present

## 2022-01-24 DIAGNOSIS — J708 Respiratory conditions due to other specified external agents: Secondary | ICD-10-CM | POA: Diagnosis present

## 2022-01-24 DIAGNOSIS — I10 Essential (primary) hypertension: Secondary | ICD-10-CM | POA: Diagnosis present

## 2022-01-24 DIAGNOSIS — Z515 Encounter for palliative care: Secondary | ICD-10-CM | POA: Diagnosis not present

## 2022-01-24 DIAGNOSIS — R7989 Other specified abnormal findings of blood chemistry: Secondary | ICD-10-CM

## 2022-01-24 DIAGNOSIS — A419 Sepsis, unspecified organism: Secondary | ICD-10-CM | POA: Diagnosis present

## 2022-01-24 DIAGNOSIS — K922 Gastrointestinal hemorrhage, unspecified: Secondary | ICD-10-CM | POA: Diagnosis not present

## 2022-01-24 DIAGNOSIS — N179 Acute kidney failure, unspecified: Secondary | ICD-10-CM

## 2022-01-24 DIAGNOSIS — D62 Acute posthemorrhagic anemia: Secondary | ICD-10-CM | POA: Diagnosis not present

## 2022-01-24 DIAGNOSIS — E119 Type 2 diabetes mellitus without complications: Secondary | ICD-10-CM | POA: Diagnosis not present

## 2022-01-24 DIAGNOSIS — N170 Acute kidney failure with tubular necrosis: Secondary | ICD-10-CM | POA: Diagnosis not present

## 2022-01-24 DIAGNOSIS — E871 Hypo-osmolality and hyponatremia: Secondary | ICD-10-CM

## 2022-01-24 DIAGNOSIS — R6510 Systemic inflammatory response syndrome (SIRS) of non-infectious origin without acute organ dysfunction: Secondary | ICD-10-CM | POA: Insufficient documentation

## 2022-01-24 DIAGNOSIS — K766 Portal hypertension: Secondary | ICD-10-CM | POA: Diagnosis present

## 2022-01-24 DIAGNOSIS — D696 Thrombocytopenia, unspecified: Secondary | ICD-10-CM | POA: Diagnosis present

## 2022-01-24 DIAGNOSIS — Z9989 Dependence on other enabling machines and devices: Secondary | ICD-10-CM

## 2022-01-24 DIAGNOSIS — I868 Varicose veins of other specified sites: Secondary | ICD-10-CM | POA: Diagnosis present

## 2022-01-24 DIAGNOSIS — Z7189 Other specified counseling: Secondary | ICD-10-CM

## 2022-01-24 DIAGNOSIS — R578 Other shock: Secondary | ICD-10-CM | POA: Diagnosis present

## 2022-01-24 DIAGNOSIS — I8511 Secondary esophageal varices with bleeding: Secondary | ICD-10-CM | POA: Diagnosis present

## 2022-01-24 DIAGNOSIS — Z66 Do not resuscitate: Secondary | ICD-10-CM | POA: Diagnosis not present

## 2022-01-24 DIAGNOSIS — G4733 Obstructive sleep apnea (adult) (pediatric): Secondary | ICD-10-CM

## 2022-01-24 DIAGNOSIS — I2510 Atherosclerotic heart disease of native coronary artery without angina pectoris: Secondary | ICD-10-CM | POA: Diagnosis present

## 2022-01-24 DIAGNOSIS — K7682 Hepatic encephalopathy: Secondary | ICD-10-CM | POA: Diagnosis present

## 2022-01-24 DIAGNOSIS — K625 Hemorrhage of anus and rectum: Secondary | ICD-10-CM | POA: Diagnosis not present

## 2022-01-24 DIAGNOSIS — I2584 Coronary atherosclerosis due to calcified coronary lesion: Secondary | ICD-10-CM | POA: Diagnosis present

## 2022-01-24 DIAGNOSIS — Z8249 Family history of ischemic heart disease and other diseases of the circulatory system: Secondary | ICD-10-CM

## 2022-01-24 DIAGNOSIS — Z833 Family history of diabetes mellitus: Secondary | ICD-10-CM

## 2022-01-24 DIAGNOSIS — D684 Acquired coagulation factor deficiency: Secondary | ICD-10-CM | POA: Diagnosis present

## 2022-01-24 DIAGNOSIS — D689 Coagulation defect, unspecified: Secondary | ICD-10-CM

## 2022-01-24 DIAGNOSIS — E11649 Type 2 diabetes mellitus with hypoglycemia without coma: Secondary | ICD-10-CM | POA: Diagnosis not present

## 2022-01-24 DIAGNOSIS — K7290 Hepatic failure, unspecified without coma: Secondary | ICD-10-CM | POA: Diagnosis not present

## 2022-01-24 DIAGNOSIS — Z8042 Family history of malignant neoplasm of prostate: Secondary | ICD-10-CM

## 2022-01-24 DIAGNOSIS — K7460 Unspecified cirrhosis of liver: Secondary | ICD-10-CM | POA: Diagnosis not present

## 2022-01-24 DIAGNOSIS — K7031 Alcoholic cirrhosis of liver with ascites: Secondary | ICD-10-CM

## 2022-01-24 DIAGNOSIS — E875 Hyperkalemia: Secondary | ICD-10-CM

## 2022-01-24 DIAGNOSIS — F419 Anxiety disorder, unspecified: Secondary | ICD-10-CM | POA: Diagnosis present

## 2022-01-24 DIAGNOSIS — J9601 Acute respiratory failure with hypoxia: Secondary | ICD-10-CM | POA: Diagnosis not present

## 2022-01-24 DIAGNOSIS — E8809 Other disorders of plasma-protein metabolism, not elsewhere classified: Secondary | ICD-10-CM | POA: Diagnosis present

## 2022-01-24 DIAGNOSIS — Z7952 Long term (current) use of systemic steroids: Secondary | ICD-10-CM

## 2022-01-24 DIAGNOSIS — J9584 Transfusion-related acute lung injury (TRALI): Secondary | ICD-10-CM | POA: Diagnosis not present

## 2022-01-24 DIAGNOSIS — K7040 Alcoholic hepatic failure without coma: Secondary | ICD-10-CM | POA: Diagnosis present

## 2022-01-24 DIAGNOSIS — K7011 Alcoholic hepatitis with ascites: Secondary | ICD-10-CM | POA: Diagnosis present

## 2022-01-24 DIAGNOSIS — F1010 Alcohol abuse, uncomplicated: Secondary | ICD-10-CM | POA: Diagnosis present

## 2022-01-24 DIAGNOSIS — Z79899 Other long term (current) drug therapy: Secondary | ICD-10-CM

## 2022-01-24 DIAGNOSIS — E872 Acidosis, unspecified: Secondary | ICD-10-CM

## 2022-01-24 DIAGNOSIS — Z8719 Personal history of other diseases of the digestive system: Secondary | ICD-10-CM

## 2022-01-24 DIAGNOSIS — K921 Melena: Principal | ICD-10-CM | POA: Diagnosis present

## 2022-01-24 DIAGNOSIS — Z794 Long term (current) use of insulin: Secondary | ICD-10-CM

## 2022-01-24 DIAGNOSIS — F17290 Nicotine dependence, other tobacco product, uncomplicated: Secondary | ICD-10-CM | POA: Diagnosis present

## 2022-01-24 DIAGNOSIS — K2211 Ulcer of esophagus with bleeding: Secondary | ICD-10-CM | POA: Diagnosis present

## 2022-01-24 DIAGNOSIS — Z86010 Personal history of colonic polyps: Secondary | ICD-10-CM

## 2022-01-24 DIAGNOSIS — Z880 Allergy status to penicillin: Secondary | ICD-10-CM

## 2022-01-24 DIAGNOSIS — R3129 Other microscopic hematuria: Secondary | ICD-10-CM | POA: Diagnosis present

## 2022-01-24 DIAGNOSIS — E785 Hyperlipidemia, unspecified: Secondary | ICD-10-CM | POA: Diagnosis present

## 2022-01-24 DIAGNOSIS — T458X1A Poisoning by other primarily systemic and hematological agents, accidental (unintentional), initial encounter: Secondary | ICD-10-CM | POA: Diagnosis not present

## 2022-01-24 HISTORY — DX: Unspecified cirrhosis of liver: K74.60

## 2022-01-24 LAB — RENAL FUNCTION PANEL
Albumin: 1.7 g/dL — ABNORMAL LOW (ref 3.5–5.0)
Anion gap: 9 (ref 5–15)
BUN: 71 mg/dL — ABNORMAL HIGH (ref 6–20)
CO2: 17 mmol/L — ABNORMAL LOW (ref 22–32)
Calcium: 6.9 mg/dL — ABNORMAL LOW (ref 8.9–10.3)
Chloride: 99 mmol/L (ref 98–111)
Creatinine, Ser: 2.88 mg/dL — ABNORMAL HIGH (ref 0.61–1.24)
GFR, Estimated: 24 mL/min — ABNORMAL LOW (ref >60)
Glucose, Bld: 174 mg/dL — ABNORMAL HIGH (ref 70–99)
Phosphorus: 8.4 mg/dL — ABNORMAL HIGH (ref 2.5–4.6)
Potassium: 5.9 mmol/L — ABNORMAL HIGH (ref 3.5–5.1)
Sodium: 125 mmol/L — ABNORMAL LOW (ref 135–145)

## 2022-01-24 LAB — COMPREHENSIVE METABOLIC PANEL
ALT: 162 U/L — ABNORMAL HIGH (ref 0–44)
AST: 216 U/L — ABNORMAL HIGH (ref 15–41)
Albumin: 1.9 g/dL — ABNORMAL LOW (ref 3.5–5.0)
Alkaline Phosphatase: 129 U/L — ABNORMAL HIGH (ref 38–126)
Anion gap: 8 (ref 5–15)
BUN: 54 mg/dL — ABNORMAL HIGH (ref 6–20)
CO2: 19 mmol/L — ABNORMAL LOW (ref 22–32)
Calcium: 7.9 mg/dL — ABNORMAL LOW (ref 8.9–10.3)
Chloride: 102 mmol/L (ref 98–111)
Creatinine, Ser: 1.63 mg/dL — ABNORMAL HIGH (ref 0.61–1.24)
GFR, Estimated: 48 mL/min — ABNORMAL LOW (ref >60)
Glucose, Bld: 163 mg/dL — ABNORMAL HIGH (ref 70–99)
Potassium: 6.4 mmol/L (ref 3.5–5.1)
Sodium: 129 mmol/L — ABNORMAL LOW (ref 135–145)
Total Bilirubin: 29.1 mg/dL (ref 0.3–1.2)
Total Protein: 5 g/dL — ABNORMAL LOW (ref 6.5–8.1)

## 2022-01-24 LAB — CBC WITH DIFFERENTIAL/PLATELET
Abs Immature Granulocytes: 0.18 K/uL — ABNORMAL HIGH (ref 0.00–0.07)
Basophils Absolute: 0 K/uL (ref 0.0–0.1)
Basophils Relative: 0 %
Eosinophils Absolute: 0 K/uL (ref 0.0–0.5)
Eosinophils Relative: 0 %
HCT: 32.3 % — ABNORMAL LOW (ref 39.0–52.0)
Hemoglobin: 10.6 g/dL — ABNORMAL LOW (ref 13.0–17.0)
Immature Granulocytes: 1 %
Lymphocytes Relative: 2 %
Lymphs Abs: 0.4 K/uL — ABNORMAL LOW (ref 0.7–4.0)
MCH: 33.9 pg (ref 26.0–34.0)
MCHC: 32.8 g/dL (ref 30.0–36.0)
MCV: 103.2 fL — ABNORMAL HIGH (ref 80.0–100.0)
Monocytes Absolute: 1.1 K/uL — ABNORMAL HIGH (ref 0.1–1.0)
Monocytes Relative: 6 %
Neutro Abs: 16.5 K/uL — ABNORMAL HIGH (ref 1.7–7.7)
Neutrophils Relative %: 91 %
Platelets: 78 K/uL — ABNORMAL LOW (ref 150–400)
RBC: 3.13 MIL/uL — ABNORMAL LOW (ref 4.22–5.81)
RDW: 18.2 % — ABNORMAL HIGH (ref 11.5–15.5)
WBC: 18.2 K/uL — ABNORMAL HIGH (ref 4.0–10.5)
nRBC: 0 % (ref 0.0–0.2)

## 2022-01-24 LAB — BLOOD GAS, VENOUS
Acid-base deficit: 5.2 mmol/L — ABNORMAL HIGH (ref 0.0–2.0)
Bicarbonate: 20 mmol/L (ref 20.0–28.0)
O2 Saturation: 81.6 %
Patient temperature: 37
pCO2, Ven: 37 mmHg — ABNORMAL LOW (ref 44–60)
pH, Ven: 7.34 (ref 7.25–7.43)
pO2, Ven: 51 mmHg — ABNORMAL HIGH (ref 32–45)

## 2022-01-24 LAB — HEMOGLOBIN AND HEMATOCRIT, BLOOD
HCT: 33.5 % — ABNORMAL LOW (ref 39.0–52.0)
Hemoglobin: 10.9 g/dL — ABNORMAL LOW (ref 13.0–17.0)

## 2022-01-24 LAB — GLUCOSE, CAPILLARY
Glucose-Capillary: 150 mg/dL — ABNORMAL HIGH (ref 70–99)
Glucose-Capillary: 160 mg/dL — ABNORMAL HIGH (ref 70–99)
Glucose-Capillary: 162 mg/dL — ABNORMAL HIGH (ref 70–99)
Glucose-Capillary: 170 mg/dL — ABNORMAL HIGH (ref 70–99)

## 2022-01-24 LAB — LACTIC ACID, PLASMA
Lactic Acid, Venous: 3.3 mmol/L (ref 0.5–1.9)
Lactic Acid, Venous: 3.8 mmol/L (ref 0.5–1.9)

## 2022-01-24 LAB — CBG MONITORING, ED
Glucose-Capillary: 150 mg/dL — ABNORMAL HIGH (ref 70–99)
Glucose-Capillary: 191 mg/dL — ABNORMAL HIGH (ref 70–99)

## 2022-01-24 LAB — AMMONIA: Ammonia: 50 umol/L — ABNORMAL HIGH (ref 9–35)

## 2022-01-24 LAB — PREPARE RBC (CROSSMATCH)

## 2022-01-24 LAB — PROTIME-INR
INR: 2.3 — ABNORMAL HIGH (ref 0.8–1.2)
Prothrombin Time: 25.1 seconds — ABNORMAL HIGH (ref 11.4–15.2)

## 2022-01-24 MED ORDER — INSULIN ASPART 100 UNIT/ML IV SOLN
5.0000 [IU] | Freq: Once | INTRAVENOUS | Status: AC
  Administered 2022-01-24: 5 [IU] via INTRAVENOUS
  Filled 2022-01-24: qty 0.05

## 2022-01-24 MED ORDER — INSULIN ASPART 100 UNIT/ML IJ SOLN
0.0000 [IU] | Freq: Every day | INTRAMUSCULAR | Status: DC
  Filled 2022-01-24: qty 0.05

## 2022-01-24 MED ORDER — SODIUM CHLORIDE 0.9% IV SOLUTION: Freq: Once | INTRAVENOUS | Status: AC

## 2022-01-24 MED ORDER — PANTOPRAZOLE 80MG IVPB - SIMPLE MED
80.0000 mg | Freq: Once | INTRAVENOUS | Status: AC
  Administered 2022-01-24: 80 mg via INTRAVENOUS
  Filled 2022-01-24: qty 80

## 2022-01-24 MED ORDER — PANTOPRAZOLE INFUSION (NEW) - SIMPLE MED
8.0000 mg/h | INTRAVENOUS | Status: AC
  Administered 2022-01-24 – 2022-01-27 (×7): 8 mg/h via INTRAVENOUS
  Filled 2022-01-24 (×2): qty 100
  Filled 2022-01-24: qty 80
  Filled 2022-01-24: qty 100
  Filled 2022-01-24 (×2): qty 80
  Filled 2022-01-24: qty 100
  Filled 2022-01-24: qty 80
  Filled 2022-01-24: qty 100
  Filled 2022-01-24 (×2): qty 80

## 2022-01-24 MED ORDER — INSULIN ASPART 100 UNIT/ML IJ SOLN
0.0000 [IU] | Freq: Three times a day (TID) | INTRAMUSCULAR | Status: DC
  Administered 2022-01-24 – 2022-01-27 (×7): 2 [IU] via SUBCUTANEOUS
  Administered 2022-01-27 (×2): 3 [IU] via SUBCUTANEOUS
  Administered 2022-01-28 (×3): 2 [IU] via SUBCUTANEOUS
  Filled 2022-01-24: qty 0.09

## 2022-01-24 MED ORDER — SODIUM CHLORIDE 0.9 % IV SOLN
50.0000 ug/h | INTRAVENOUS | Status: DC
  Administered 2022-01-24 – 2022-01-29 (×13): 50 ug/h via INTRAVENOUS
  Filled 2022-01-24 (×15): qty 1

## 2022-01-24 MED ORDER — IOHEXOL 350 MG/ML SOLN
100.0000 mL | Freq: Once | INTRAVENOUS | Status: AC | PRN
  Administered 2022-01-24: 100 mL via INTRAVENOUS

## 2022-01-24 MED ORDER — ORAL CARE MOUTH RINSE: 15.0000 mL | OROMUCOSAL | Status: DC | PRN

## 2022-01-24 MED ORDER — ALBUTEROL SULFATE (2.5 MG/3ML) 0.083% IN NEBU
10.0000 mg | INHALATION_SOLUTION | Freq: Once | RESPIRATORY_TRACT | Status: AC
  Administered 2022-01-24: 10 mg via RESPIRATORY_TRACT
  Filled 2022-01-24: qty 12

## 2022-01-24 MED ORDER — SODIUM CHLORIDE 0.9 % IV BOLUS (SEPSIS)
1000.0000 mL | Freq: Once | INTRAVENOUS | Status: AC
  Administered 2022-01-24: 1000 mL via INTRAVENOUS

## 2022-01-24 MED ORDER — OCTREOTIDE LOAD VIA INFUSION
50.0000 ug | Freq: Once | INTRAVENOUS | Status: AC
  Administered 2022-01-24: 50 ug via INTRAVENOUS
  Filled 2022-01-24: qty 25

## 2022-01-24 MED ORDER — ALBUTEROL SULFATE (2.5 MG/3ML) 0.083% IN NEBU
INHALATION_SOLUTION | RESPIRATORY_TRACT | Status: AC
  Filled 2022-01-24: qty 12

## 2022-01-24 MED ORDER — SODIUM CHLORIDE 0.9 % IV SOLN
2.0000 g | Freq: Two times a day (BID) | INTRAVENOUS | Status: DC
  Administered 2022-01-24 (×2): 2 g via INTRAVENOUS
  Filled 2022-01-24 (×2): qty 12.5

## 2022-01-24 MED ORDER — DEXTROSE 50 % IV SOLN
1.0000 | Freq: Once | INTRAVENOUS | Status: AC
  Administered 2022-01-24: 50 mL via INTRAVENOUS
  Filled 2022-01-24: qty 50

## 2022-01-24 MED ORDER — METRONIDAZOLE 500 MG/100ML IV SOLN
500.0000 mg | Freq: Two times a day (BID) | INTRAVENOUS | Status: DC
  Administered 2022-01-24 – 2022-01-25 (×3): 500 mg via INTRAVENOUS
  Filled 2022-01-24 (×3): qty 100

## 2022-01-24 MED ORDER — SODIUM CHLORIDE 0.9 % IV SOLN
10.0000 mL/h | Freq: Once | INTRAVENOUS | Status: AC
  Administered 2022-01-26: 10 mL/h via INTRAVENOUS

## 2022-01-24 MED ORDER — CHLORHEXIDINE GLUCONATE CLOTH 2 % EX PADS
6.0000 | MEDICATED_PAD | Freq: Every day | CUTANEOUS | Status: DC
  Administered 2022-01-24 – 2022-01-29 (×8): 6 via TOPICAL

## 2022-01-24 MED ORDER — SODIUM ZIRCONIUM CYCLOSILICATE 10 G PO PACK
10.0000 g | PACK | Freq: Once | ORAL | Status: AC
  Administered 2022-01-24: 10 g via ORAL
  Filled 2022-01-24: qty 1

## 2022-01-24 NOTE — ED Triage Notes (Signed)
Pt. BIB [facility] for GI bleed. Pt. Was admitted recently for the same. Pt. Has bright red and dark stool. Pt. Endorses SOB, but denies N&V. pT. Is also jaundice.

## 2022-01-24 NOTE — Consult Note (Signed)
[facility] Gastroenterology Consult  Referring Provider: No ref. provider found Primary Care Physician:  Janith Lima, MD Primary Gastroenterologist: [name]  Reason for Consultation: hematochezia  SUBJECTIVE:   HPI: Leroy Bell is a 61 y.o. 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 He also has medical history of coronary artery disease, diabetes, hypertension and hyperlipidemia. He is known to the [facility], he was recently admitted to [facility] from 01/09/22 - 01/21/22 for hemorrhagic shock, elevated liver enzyme testing, coffee ground emesis and altered mental status.   Prior endoscopic history:  EGD with Dr. Paulita Fujita on 01/14/22 which showed grade I varices in middle third of esophagus, small in size and unable to be banded, LA grade E esophagitis with no bleeding, moderate portal hypertensive gastropathy in the entire stomach, normal appearing duodenum. EGD on 05/23/20 (Dr. Michail Sermon) with findings of gastritis, grade II esophageal varices (no banding) and congested mucosa in cardia. Pathology from small intestine biopsy showed small bowel mucosa with no significant pathologic changes. Pathology from stomach biopsy showed no H.Pylori. Esophageal biopsy was negative for intestinal metaplasia, negative for eosinophilic esophagitis. Colonoscopy was completed on 05/23/20 as well (Dr. Michail Sermon) with findings of 10 cm sigmoid colon polyp removed via hot snare, 5 mm sigmoid polyp semi-pedunculated removed via hot snare, 2 mm cecum polyp removed via cold forceps (sessile serrated adenoma), medium grade internal hemorrhoids, moderate congestion in rectum. Rectum biopsy showed colonic mucosa with abundant muciphages in the lamina propria and reactive changes. Capsule endoscopy on 07/03/20 which showed mild/moderate gastritis and well as small non-bleeding arteriovenous malformations.   His family is  present at bedside today and helps with supplemental history. Patient began to have shortness of breath late yesterday evening, ambulance was contacted and while en-route to house, patient had episode of painless hematochezia (estimated 01/14/2022 0200). He had an additional episode of rectal bleeding upon presentation to the hospital. He has had no further episode. He has had no nausea or vomiting. He has no chest pain. He takes no anticoagulant medications. No alcohol use since before his hospitalization 01/09/22. His spouse notes that he has become more distended.   Labs on presentation showed Hgb 10.6, WBC 18.2, PLT 78, INR 2.3, Na 129, K 6.4, AST/ALT 216/162, ALP 129, total bilirubin 29.1, ammonia 50. No abdominal imaging was completed.   Prior to admission medications include Protonix 40 mg PO BID, coreg 3.125 mg PO BID, rifaximin 550 mg PO BID, prednisone 40 mg PO daily.  Past Medical History:  Diagnosis Date   Alcohol abuse    Coronary atherosclerosis due to calcified coronary lesion    Diabetes ([place])    Dyspnea    from anemia   Hyperlipidemia    Hypertension    Past Surgical History:  Procedure Laterality Date   ESOPHAGOGASTRODUODENOSCOPY N/A 01/14/2022   Procedure: ESOPHAGOGASTRODUODENOSCOPY (EGD);  Surgeon: Arta Silence, MD;  Location: [facility];  Service: Gastroenterology;  Laterality: N/A;   NO PAST SURGERIES     Prior to Admission medications   Medication Sig Start Date End Date Taking? Authorizing Provider  carvedilol (COREG) 3.125 MG tablet Take 1 tablet (3.125 mg total) by mouth 2 (two) times daily with a meal. 01/21/22 04/21/22 Yes [place] Donnamarie Poag, DO  folic acid (FOLVITE) 1 MG tablet Take 1 tablet (1 mg total) by mouth daily. 01/22/22 04/22/22 Yes [place] Eric J, DO  insulin detemir (LEVEMIR) 100 UNIT/ML FlexPen Inject 20 Units into the skin  daily. 01/21/22 04/21/22 Yes [place] Donnamarie Poag, DO  Multiple Vitamin (MULTIVITAMIN WITH MINERALS) TABS tablet Take 1 tablet by mouth  daily. 01/22/22  Yes [place] Eric J, DO  pantoprazole (PROTONIX) 40 MG tablet Take 1 tablet (40 mg total) by mouth 2 (two) times daily. 01/21/22 04/21/22 Yes [place] Eric J, DO  predniSONE (DELTASONE) 20 MG tablet Take 2 tablets (40 mg total) by mouth daily. 01/23/22 02/22/22 Yes [place] Eric J, DO  rifaximin (XIFAXAN) 550 MG TABS tablet Take 1 tablet (550 mg total) by mouth 2 (two) times daily. 01/21/22 04/21/22 Yes [place] Eric J, DO  blood glucose meter kit and supplies Dispense based on patient and insurance preference. Use up to four times daily as directed. (FOR ICD-10 E10.9, E11.9). 01/21/22   [place] Donnamarie Poag, DO  Insulin Pen Needle (PEN NEEDLES 3/16") 31G X 5 MM MISC Use as directed with insulin pen 01/21/22   [place] Donnamarie Poag, DO  thiamine ([employer]S B-1) 100 MG tablet Take 1 tablet (100 mg total) by mouth daily. Patient not taking: Reported on 01/23/2022 01/21/22 04/21/22  [place] Eric J, DO   Current Facility-Administered Medications  Medication Dose Route Frequency Provider Last Rate Last Admin   0.9 %  sodium chloride infusion  10 mL/hr Intravenous Once Cherylann Ratel A, DO   Held at 01/06/2022 2924   albuterol (PROVENTIL) (2.5 MG/3ML) 0.083% nebulizer solution            ceFEPIme (MAXIPIME) 2 g in sodium chloride 0.9 % 100 mL IVPB  2 g Intravenous Q12H Kyle, Tyrone A, DO 200 mL/hr at 12/30/2021 1041 2 g at 01/01/2022 1041   Chlorhexidine Gluconate Cloth 2 % PADS 6 each  6 each Topical Daily Marylyn Ishihara, Tyrone A, DO   6 each at 01/18/2022 1220   insulin aspart (novoLOG) injection 0-5 Units  0-5 Units Subcutaneous QHS Kyle, Tyrone A, DO       insulin aspart (novoLOG) injection 0-9 Units  0-9 Units Subcutaneous TID WC Kyle, Tyrone A, DO   2 Units at 01/19/2022 1353   metroNIDAZOLE (FLAGYL) IVPB 500 mg  500 mg Intravenous Q12H Kyle, Tyrone A, DO 100 mL/hr at 12/30/2021 1400 500 mg at 01/06/2022 1400   octreotide (SANDOSTATIN) 500 mcg in sodium chloride 0.9 % 250 mL (2 mcg/mL) infusion  50 mcg/hr Intravenous Continuous  Kyle, Tyrone A, DO 25 mL/hr at 01/16/2022 1412 50 mcg/hr at 01/14/2022 1412   Oral care mouth rinse  15 mL Mouth Rinse PRN Marylyn Ishihara, Tyrone A, DO       pantoprozole (PROTONIX) 80 mg /NS 100 mL infusion  8 mg/hr Intravenous Continuous Kyle, Tyrone A, DO 10 mL/hr at 01/14/2022 0439 8 mg/hr at 12/30/2021 0439   Allergies as of 01/19/2022 - Review Complete 01/10/2022  Allergen Reaction Noted   Penicillins Hives 08/23/2018   Family History  Problem Relation Age of Onset   Diabetes Mother    Hypertension Mother    Diabetes Father    Hypertension Father    Heart attack Father    Prostate cancer Father    Diabetes Sister    Healthy Daughter    Healthy Son    Diabetes Paternal Grandmother    Diabetes Paternal Grandfather    Cancer Paternal Grandfather        unknown    Social History   Socioeconomic History   Marital status: Married    Spouse name: Leroy Bell   Number of children: 2   Years of education: Not on file   Highest  education level: 12th grade  Occupational History   Not on file  Tobacco Use   Smoking status: Never   Smokeless tobacco: Current    Types: Snuff  Vaping Use   Vaping Use: Never used  Substance and Sexual Activity   Alcohol use: Yes    Alcohol/week: 24.0 standard drinks of alcohol    Types: 24 Cans of beer per week   Drug use: Not Currently   Sexual activity: Yes    Partners: Female    Birth control/protection: None  Other Topics Concern   Not on file  Social History Narrative   Lives at home with wife Leroy Bell   Right handed: no soda and only decaf coffee   Social Determinants of Radio broadcast assistant Strain: Not on file  Food Insecurity: Not on file  Transportation Needs: No Transportation Needs (01/09/2022)   PRAPARE - Hydrologist (Medical): No    Lack of Transportation (Non-Medical): No  Physical Activity: Not on file  Stress: Not on file  Social Connections: Not on file  Intimate Partner Violence: Not At Risk (01/09/2022)    Humiliation, Afraid, Rape, and Kick questionnaire    Fear of Current or Ex-Partner: No    Emotionally Abused: No    Physically Abused: No    Sexually Abused: No   Review of Systems:  Review of Systems  Respiratory:  Negative for shortness of breath.   Cardiovascular:  Negative for chest pain.  Gastrointestinal:  Positive for blood in stool. Negative for abdominal pain, melena, nausea and vomiting.       Abdominal distention.  Skin:        Jaundice.  Neurological:        Confusion.    OBJECTIVE:   Temp:  [97.4 F (36.3 C)-97.6 F (36.4 C)] 97.5 F (36.4 C) (09/30 1241) Pulse Rate:  [65-72] 66 (09/30 1300) Resp:  [17-26] 25 (09/30 1300) BP: (89-136)/(44-72) 128/53 (09/30 1300) SpO2:  [95 %-100 %] 96 % (09/30 1300)   Physical Exam Constitutional:      General: He is not in acute distress.    Appearance: He is ill-appearing. He is not toxic-appearing.  Eyes:     General: Scleral icterus present.  Cardiovascular:     Rate and Rhythm: Normal rate and regular rhythm.  Pulmonary:     Effort: No respiratory distress.     Breath sounds: Normal breath sounds.     Comments: No supplemental oxygen in place at time of exam. Abdominal:     General: Bowel sounds are normal. There is distension.     Palpations: Abdomen is soft.     Tenderness: There is no abdominal tenderness. There is no guarding.  Musculoskeletal:     Right lower leg: Edema (pitting edema to calf) present.     Left lower leg: Edema (pitting edema to calf) present.  Skin:    General: Skin is warm and dry.  Neurological:     Mental Status: He is alert.     Comments: Oriented to person and place, confused with naming president of [place].     Labs: Recent Labs    12/29/2021 0411  WBC 18.2*  HGB 10.6*  HCT 32.3*  PLT 78*   BMET Recent Labs    01/19/2022 0411  NA 129*  K 6.4*  CL 102  CO2 19*  GLUCOSE 163*  BUN 54*  CREATININE 1.63*  CALCIUM 7.9*   LFT Recent Labs    12/26/2021  0411  PROT 5.0*   ALBUMIN 1.9*  AST 216*  ALT 162*  ALKPHOS 129*  BILITOT 29.1*   PT/INR Recent Labs    01/09/2022 0411  LABPROT 25.1*  INR 2.3*    Diagnostic imaging: US RENAL  Result Date: 01/15/2022 CLINICAL DATA:  Acute kidney injury. EXAM: RENAL / URINARY TRACT ULTRASOUND COMPLETE COMPARISON:  None Available. FINDINGS: Right Kidney: Renal measurements: 11.7 x 5.0 x 6.5 cm = volume: 195.5 mL. Increased parenchymal echogenicity. No mass or hydronephrosis visualized. Left Kidney: Renal measurements: 10.4 x 5.4 x 6.3 cm = volume: 182.9 mL. Echogenicity within normal limits. No mass or hydronephrosis visualized. Bladder: The ureteral jets are not visualized. There is abnormal wall thickening identified along the dome of the bladder measuring 1.1 cm. Other: None. IMPRESSION: 1. No hydronephrosis. 2. Increased parenchymal echogenicity of the right kidney suggestive of chronic medical renal disease. 3. Abnormal wall thickening along the dome of the bladder measuring 1.1 cm. Cannot exclude a bladder mass. Consider further evaluation with cystoscopy. Electronically Signed   By: Kerby Moors M.D.   On: 01/16/2022 13:34    IMPRESSION: Painless hematochezia Cirrhosis related to alcohol use  - Decompensated by esophageal varices, hepatic encephalopathy Lactic acidosis Hyperbilirubinemia Hyperkalemia Leukocytosis Thrombocytopenia Abdominal distention Personal history colon polyps  PLAN: - GI bleeding source is undetermined at moment, doubt brisk upper GI bleed given lack of nausea/vomiting and current hemodynamic stability, no prior diverticulosis noted on colonoscopy, rectal congestion and internal hemorrhoids noted on prior colonoscopy, possible rectal varices? - Recommend check CTA abdomen/pelvis to further evaluate bleeding as well as abdominal distention - Continue IV PPI therapy and Octreotide therapy for now  - Trend liver enzyme panel and INR daily  - Trend H/H, transfuse for Hgb < 7  - If CTA  concerning for obvious source of GI blood loss, consider colonoscopy to evaluate further, risks/benefits/alternatives were discussed with patient and his family, he was agreeable to proceed if needed - Consider addition of diuretic therapy once acute illness resolves - Avoid hepatoxic medications  - Ok for clear liquid diet for now - GI will follow   LOS: 0 days   Danton Clap, [facility]

## 2022-01-24 NOTE — H&P (Signed)
History and Physical    Patient: Leroy Bell HFW:[id number] DOB: 1961-04-26 DOA: 01/03/2022 DOS: the patient was seen and examined on 01/21/2022 PCP: Janith Lima, MD  Patient coming from: Home  Chief Complaint:  Chief Complaint  Patient presents with   GI Bleeding   HPI: Leroy Bell is a 61 y.o. male with medical history significant of alcohol abuse, hepatic cirrhosis, pancytopenia, HLD, OSA, HTN. Presenting with abdominal pain and rectal bleeding. He was recently discharged from the hospital after a stay for hemorrhagic shock secondary to GIB. His EGD for notable for varices, erosive esophagitis and portal gastropathy. He was sent home with coreg and protonix. He's Hgb was 12 at discharge. His wife reports that he was ok until yesterday when he started complaining of abdominal pain and nausea. This continued throughout the evening. In the evening they noticed his BM was full of blood. When they saw this, they became concerned and brought him to the ED for evaluation.  They deny any other aggravating or alleviating factors.   Review of Systems: As mentioned in the history of present illness. All other systems reviewed and are negative. Past Medical History:  Diagnosis Date   Alcohol abuse    Coronary atherosclerosis due to calcified coronary lesion    Diabetes ([place])    Dyspnea    from anemia   Hyperlipidemia    Hypertension    Past Surgical History:  Procedure Laterality Date   ESOPHAGOGASTRODUODENOSCOPY N/A 01/14/2022   Procedure: ESOPHAGOGASTRODUODENOSCOPY (EGD);  Surgeon: Arta Silence, MD;  Location: [facility];  Service: Gastroenterology;  Laterality: N/A;   NO PAST SURGERIES     Social History:  reports that he has never smoked. His smokeless tobacco use includes snuff. He reports current alcohol use of about 24.0 standard drinks of alcohol per week. He reports that he does not currently use drugs.  Allergies  Allergen Reactions   Penicillins Hives    Did it  involve swelling of the face/tongue/throat, SOB, or low BP? No Did it involve sudden or severe rash/hives, skin peeling, or any reaction on the inside of your mouth or nose? Yes Did you need to seek medical attention at a hospital or doctor's office? Unknown When did it last happen?   childhood    If all above answers are "NO", may proceed with cephalosporin use.      Family History  Problem Relation Age of Onset   Diabetes Mother    Hypertension Mother    Diabetes Father    Hypertension Father    Heart attack Father    Prostate cancer Father    Diabetes Sister    Healthy Daughter    Healthy Son    Diabetes Paternal Grandmother    Diabetes Paternal Grandfather    Cancer Paternal Grandfather        unknown     Prior to Admission medications   Medication Sig Start Date End Date Taking? Authorizing Provider  carvedilol (COREG) 3.125 MG tablet Take 1 tablet (3.125 mg total) by mouth 2 (two) times daily with a meal. 01/21/22 04/21/22 Yes [place] Donnamarie Poag, DO  folic acid (FOLVITE) 1 MG tablet Take 1 tablet (1 mg total) by mouth daily. 01/22/22 04/22/22 Yes [place] Eric J, DO  insulin detemir (LEVEMIR) 100 UNIT/ML FlexPen Inject 20 Units into the skin daily. 01/21/22 04/21/22 Yes [place] Donnamarie Poag, DO  Multiple Vitamin (MULTIVITAMIN WITH MINERALS) TABS tablet Take 1 tablet by mouth daily. 01/22/22  Yes [place] Donnamarie Poag, DO  pantoprazole (PROTONIX) 40 MG tablet Take 1 tablet (40 mg total) by mouth 2 (two) times daily. 01/21/22 04/21/22 Yes [place] Eric J, DO  predniSONE (DELTASONE) 20 MG tablet Take 2 tablets (40 mg total) by mouth daily. 01/23/22 02/22/22 Yes [place] J, DO  rifaximin (XIFAXAN) 550 MG TABS tablet Take 1 tablet (550 mg total) by mouth 2 (two) times daily. 01/21/22 04/21/22 Yes [place] Eric J, DO  blood glucose meter kit and supplies Dispense based on patient and insurance preference. Use up to four times daily as directed. (FOR ICD-10 E10.9, E11.9). 01/21/22   [place] Donnamarie Poag,  DO  Insulin Pen Needle (PEN NEEDLES 3/16") 31G X 5 MM MISC Use as directed with insulin pen 01/21/22   [place] Donnamarie Poag, DO  thiamine (CVS B-1) 100 MG tablet Take 1 tablet (100 mg total) by mouth daily. Patient not taking: Reported on 01/04/2022 01/21/22 04/21/22  [place] Eric J, [place]    Physical Exam: Vitals:   01/07/2022 0519 01/08/2022 0530 01/02/2022 0605 01/20/2022 0652  BP: 107/63 (!) 125/56 97/72   Pulse: 71 72 70   Resp: (!) 21 (!) 26 (!) 23   Temp:      TempSrc:      SpO2: 99% 99% 100% 100%   General: 61 y.o. male resting in bed in NAD Eyes: PERRL, ichteric sclera ENMT: Nares patent w/o discharge, orophaynx clear, dentition poor, ears w/o discharge/lesions/ulcers Neck: Supple, trachea midline Cardiovascular: RRR, +S1, S2, no g/r, 2/6 SEM, equal pulses throughout Respiratory: CTABL, no w/r/r, normal WOB GI: BS+, distended but soft, global ttp, no masses noted, no organomegaly noted MSK: No c/c; b/l pedal edema, jaundiced Neuro: A&O x 3, no focal deficits Psyc: Appropriate interaction and affect, calm/cooperative  Data Reviewed:  Results for orders placed or performed during the hospital encounter of 12/29/2021 (from the past 24 hour(s))  Blood gas, venous (at [facility] and [facility], not at [facility])     Status: Abnormal   Collection Time: 01/02/2022  4:11 AM  Result Value Ref Range   pH, Ven 7.34 7.25 - 7.43   pCO2, Ven 37 (L) 44 - 60 mmHg   pO2, Ven 51 (H) 32 - 45 mmHg   Bicarbonate 20.0 20.0 - 28.0 mmol/L   Acid-base deficit 5.2 (H) 0.0 - 2.0 mmol/L   O2 Saturation 81.6 %   Patient temperature 37.0   Comprehensive metabolic panel     Status: Abnormal   Collection Time: 01/19/2022  4:11 AM  Result Value Ref Range   Sodium 129 (L) 135 - 145 mmol/L   Potassium 6.4 (HH) 3.5 - 5.1 mmol/L   Chloride 102 98 - 111 mmol/L   CO2 19 (L) 22 - 32 mmol/L   Glucose, Bld 163 (H) 70 - 99 mg/dL   BUN 54 (H) 6 - 20 mg/dL   Creatinine, Ser 1.63 (H) 0.61 - 1.24 mg/dL   Calcium 7.9 (L) 8.9 - 10.3 mg/dL   Total  Protein 5.0 (L) 6.5 - 8.1 g/dL   Albumin 1.9 (L) 3.5 - 5.0 g/dL   AST 216 (H) 15 - 41 U/L   ALT 162 (H) 0 - 44 U/L   Alkaline Phosphatase 129 (H) 38 - 126 U/L   Total Bilirubin 29.1 (HH) 0.3 - 1.2 mg/dL   GFR, Estimated 48 (L) >60 mL/min   Anion gap 8 5 - 15  Type and screen [place]     Status: None (Preliminary result)   Collection Time: 12/27/2021  4:11 AM  Result Value Ref Range   ABO/RH(D) PENDING    Antibody Screen      NEG Performed at [facility] [address], [place], [place] [address]    Sample Expiration PENDING   CBC with Differential     Status: Abnormal   Collection Time: 01/07/2022  4:11 AM  Result Value Ref Range   WBC 18.2 (H) 4.0 - 10.5 K/uL   RBC 3.13 (L) 4.22 - 5.81 MIL/uL   Hemoglobin 10.6 (L) 13.0 - 17.0 g/dL   HCT 32.3 (L) 39.0 - 52.0 %   MCV 103.2 (H) 80.0 - 100.0 fL   MCH 33.9 26.0 - 34.0 pg   MCHC 32.8 30.0 - 36.0 g/dL   RDW 18.2 (H) 11.5 - 15.5 %   Platelets 78 (L) 150 - 400 K/uL   nRBC 0.0 0.0 - 0.2 %   Neutrophils Relative % 91 %   Neutro Abs 16.5 (H) 1.7 - 7.7 K/uL   Lymphocytes Relative 2 %   Lymphs Abs 0.4 (L) 0.7 - 4.0 K/uL   Monocytes Relative 6 %   Monocytes Absolute 1.1 (H) 0.1 - 1.0 K/uL   Eosinophils Relative 0 %   Eosinophils Absolute 0.0 0.0 - 0.5 K/uL   Basophils Relative 0 %   Basophils Absolute 0.0 0.0 - 0.1 K/uL   Immature Granulocytes 1 %   Abs Immature Granulocytes 0.18 (H) 0.00 - 0.07 K/uL  Protime-INR     Status: Abnormal   Collection Time: 01/22/2022  4:11 AM  Result Value Ref Range   Prothrombin Time 25.1 (H) 11.4 - 15.2 seconds   INR 2.3 (H) 0.8 - 1.2  Ammonia     Status: Abnormal   Collection Time: 12/27/2021  4:30 AM  Result Value Ref Range   Ammonia 50 (H) 9 - 35 umol/L  Lactic acid, plasma     Status: Abnormal   Collection Time: 12/31/2021  4:30 AM  Result Value Ref Range   Lactic Acid, Venous 3.3 (HH) 0.5 - 1.9 mmol/L  CBG monitoring, ED     Status: Abnormal   Collection  Time: 01/03/2022  6:24 AM  Result Value Ref Range   Glucose-Capillary 150 (H) 70 - 99 mg/dL   EKG: sinus, no st elevations  Assessment and Plan: GIB     - admit to inpt, SDU     - continue protonix, octreotide     - NPO until seen by GI ([facility] onboard, appreciate assistance)     - trend H&H; transfuse for Hgb < 7  AKI     - renal US     - watch nephrotoxins     - fluids  Hyperkalemia     - lokelma given in ED; repeat renal function panel at noon  Alcoholic cirrhosis w/ ascities Alcoholic hepatitis Hyperbilirubinemia     - was discharged on steroids a few days ago; will defer to GI for contiuance  Sepsis Lactic acidosis     - he meets sepsis criteria     - fluids, abx, Bld Cx, Ucx     - will tap ascites as he stabilizes  DM2     - SSI, glucose checks     - DM diet when no longer NPO  OSA     - CPAP qHS  Hx of HTN     - he's hypotensive     - holding home regimen  HLD     - hold home regimen  Advance Care Planning:  Code Status: FULL  Consults: [facility]  Family Communication: w/ wife at bedside  Severity of Illness: The appropriate patient status for this patient is INPATIENT. Inpatient status is judged to be reasonable and necessary in order to provide the required intensity of service to ensure the patient's safety. The patient's presenting symptoms, physical exam findings, and initial radiographic and laboratory data in the context of their chronic comorbidities is felt to place them at high risk for further clinical deterioration. Furthermore, it is not anticipated that the patient will be medically stable for discharge from the hospital within 2 midnights of admission.   * I certify that at the point of admission it is my clinical judgment that the patient will require inpatient hospital care spanning beyond 2 midnights from the point of admission due to high intensity of service, high risk for further deterioration and high frequency of surveillance  required.*  Time spent in coordination of this H&P: 60 minutes  Author: Jonnie Finner, DO 01/21/2022 7:28 AM  For on call review [email or web address].

## 2022-01-24 NOTE — ED Provider Notes (Signed)
[facility] DEPT Provider Note   CSN: [id number] Arrival date & time: 01/17/2022  4917     History  Chief Complaint  Patient presents with   GI Bleeding    Leroy Bell is a 61 y.o. male.  The history is provided by the patient.  He has history of hypertension, diabetes, hyperlipidemia coronary artery disease, alcoholic cirrhosis with esophageal varices and comes in because of rectal bleeding.  He states that he passed blood per rectum 2 days ago and then again tonight.  He has noted that he was lightheaded when on the commode but denies any lightheadedness otherwise.  He denies any abdominal pain.  He states that he is no longer drinking.   Home Medications Prior to Admission medications   Medication Sig Start Date End Date Taking? Authorizing Provider  blood glucose meter kit and supplies Dispense based on patient and insurance preference. Use up to four times daily as directed. (FOR ICD-10 E10.9, E11.9). 01/21/22   [place] Donnamarie Poag, DO  carvedilol (COREG) 3.125 MG tablet Take 1 tablet (3.125 mg total) by mouth 2 (two) times daily with a meal. 01/21/22 04/21/22  [place] Donnamarie Poag, DO  folic acid (FOLVITE) 1 MG tablet Take 1 tablet (1 mg total) by mouth daily. 01/22/22 04/22/22  [place] Donnamarie Poag, DO  insulin detemir (LEVEMIR) 100 UNIT/ML FlexPen Inject 20 Units into the skin daily. 01/21/22 04/21/22  [place] Donnamarie Poag, DO  Insulin Pen Needle (PEN NEEDLES 3/16") 31G X 5 MM MISC Use as directed with insulin pen 01/21/22   [place] Donnamarie Poag, DO  Multiple Vitamin (MULTIVITAMIN WITH MINERALS) TABS tablet Take 1 tablet by mouth daily. 01/22/22   [place] Eric J, DO  pantoprazole (PROTONIX) 40 MG tablet Take 1 tablet (40 mg total) by mouth 2 (two) times daily. 01/21/22 04/21/22  [place] Donnamarie Poag, DO  predniSONE (DELTASONE) 20 MG tablet Take 2 tablets (40 mg total) by mouth daily. 01/23/22 02/22/22  [place] Donnamarie Poag, DO  rifaximin (XIFAXAN) 550 MG TABS tablet Take 1 tablet (550  mg total) by mouth 2 (two) times daily. 01/21/22 04/21/22  [place] Eric J, DO  thiamine ([employer] B-1) 100 MG tablet Take 1 tablet (100 mg total) by mouth daily. 01/21/22 04/21/22  [place] Eric J, DO      Allergies    Penicillins    Review of Systems   Review of Systems  All other systems reviewed and are negative.   Physical Exam Updated Vital Signs BP 130/65 (BP Location: Left Arm)   Pulse 72   Temp (!) 97.5 F (36.4 C) (Oral)   Resp 17   SpO2 99%  Physical Exam Vitals and nursing note reviewed.   61 year old male, resting comfortably and in no acute distress. Vital signs are normal. Oxygen saturation is 99%, which is normal. Head is normocephalic and atraumatic. PERRLA, EOMI. Oropharynx is clear.  Marked scleral icterus is present. Neck is nontender and supple without adenopathy or JVD. Back is nontender and there is no CVA tenderness. Lungs are clear without rales, wheezes, or rhonchi. Chest is nontender. Heart has regular rate and rhythm without murmur. Abdomen is soft, protuberant with positive fluid wave, nontender. Extremities have trace edema, full range of motion is present. Skin is markedly icteric. Neurologic: Mental status is normal, cranial nerves are intact, there are no motor or sensory deficits.  ED Results / Procedures / Treatments   Labs (all labs ordered are listed, but only abnormal results are displayed) Labs  Reviewed  AMMONIA  BLOOD GAS, VENOUS  COMPREHENSIVE METABOLIC PANEL  LACTIC ACID, PLASMA  CBC WITH DIFFERENTIAL/PLATELET  PROTIME-INR  URINALYSIS, ROUTINE W REFLEX MICROSCOPIC  POC OCCULT BLOOD, ED  TYPE AND SCREEN    EKG EKG Interpretation  Date/Time:  Saturday January 24 2022 03:35:06 EDT Ventricular Rate:  67 PR Interval:  161 QRS Duration: 119 QT Interval:  442 QTC Calculation: 467 R Axis:   -37 Text Interpretation: Sinus rhythm Nonspecific IVCD with LAD Low voltage, extremity leads Probable anteroseptal infarct, old When  compared with ECG of 01/14/2022, HEART RATE has increased Confirmed by Delora Fuel ([id number]) on 01/08/2022 4:26:56 AM  Radiology No results found.  Procedures Procedures  Cardiac monitor shows normal sinus rhythm, per my interpretation.  Medications Ordered in ED Medications  octreotide (SANDOSTATIN) 2 mcg/mL load via infusion 50 mcg (has no administration in time range)    And  octreotide (SANDOSTATIN) 500 mcg in sodium chloride 0.9 % 250 mL (2 mcg/mL) infusion (has no administration in time range)  pantoprazole (PROTONIX) 80 mg /NS 100 mL IVPB (has no administration in time range)  pantoprozole (PROTONIX) 80 mg /NS 100 mL infusion (has no administration in time range)    ED Course/ Medical Decision Making/ A&P                           Medical Decision Making Amount and/or Complexity of Data Reviewed Labs: ordered.  Risk OTC drugs. Prescription drug management. Decision regarding hospitalization.   Gastrointestinal bleeding in patient with cirrhosis and known esophageal varices.  Even though he has not vomited blood, I I feel that he needs to be treated for possible variceal bleed.  I have reviewed his old records, and he had been admitted on 01/09/2022 with an upper gastrointestinal bleed and upper endoscopy showed grade 1 varices.  Differential diagnosis of bleeding tonight includes esophageal varices, gastritis, peptic ulcer disease, AV malformation, diverticulosis.  I have ordered pantoprazole load and infusion, octreotide load and infusion, and screening labs including type and screen.  He is currently hemodynamically stable.  It is noted that on metabolic panel 08/19/9561 total bilirubin was 23.3 and hemoglobin was 12.0.  Platelet count was 65,000.  I have reviewed and interpreted his laboratory tests today and my interpretation is mild hyponatremia which is likely delusional, hyperkalemia slightly worse than on 9/27, acute kidney injury with creatinine 1.63 compared with 1.04 on  9/27 elevated transaminases and bilirubin and alkaline phosphatase which is slightly worse than on 9/27, hemoglobin dropped 1.4 g, stable thrombocytopenia, minimal elevation of ammonia which is not clinically significant, elevated lactic acid level which is felt to be related to hypoperfusion and not sepsis, elevated INR secondary to hepatic disease.  Patient has remained mostly hemodynamically stable although blood pressure did occasionally drop below 100.  Case discussed with Dr. Bridgett Larsson of [facility], who agrees to admit the patient.  Secure chat has been sent to Dr. Randel Pigg, on-call for gastroenterology, to let her know that she is on consult for this patient.  CRITICAL CARE Performed by: Delora Fuel Total critical care time: 60 minutes Critical care time was exclusive of separately billable procedures and treating other patients. Critical care was necessary to treat or prevent imminent or life-threatening deterioration. Critical care was time spent personally by me on the following activities: development of treatment plan with patient and/or surrogate as well as nursing, discussions with consultants, evaluation of patient's response to treatment, examination of patient, obtaining  history from patient or surrogate, ordering and performing treatments and interventions, ordering and review of laboratory studies, ordering and review of radiographic studies, pulse oximetry and re-evaluation of patient's condition.  Final Clinical Impression(s) / ED Diagnoses Final diagnoses:  Gastrointestinal hemorrhage, unspecified gastrointestinal hemorrhage type  Alcoholic cirrhosis of liver with ascites ([name])  Hyperkalemia  Acute kidney injury (nontraumatic) ([name])  Elevated lactic acid level  Thrombocytopenia ([place])    Rx / DC Orders ED Discharge Orders     None         Delora Fuel, MD 83/66/29 [phone]

## 2022-01-25 ENCOUNTER — Inpatient Hospital Stay (HOSPITAL_COMMUNITY): Payer: Managed Care, Other (non HMO)

## 2022-01-25 DIAGNOSIS — N179 Acute kidney failure, unspecified: Secondary | ICD-10-CM

## 2022-01-25 DIAGNOSIS — E871 Hypo-osmolality and hyponatremia: Secondary | ICD-10-CM

## 2022-01-25 DIAGNOSIS — K7290 Hepatic failure, unspecified without coma: Secondary | ICD-10-CM

## 2022-01-25 DIAGNOSIS — F1010 Alcohol abuse, uncomplicated: Secondary | ICD-10-CM | POA: Diagnosis not present

## 2022-01-25 DIAGNOSIS — E872 Acidosis, unspecified: Secondary | ICD-10-CM

## 2022-01-25 DIAGNOSIS — E119 Type 2 diabetes mellitus without complications: Secondary | ICD-10-CM

## 2022-01-25 DIAGNOSIS — K922 Gastrointestinal hemorrhage, unspecified: Secondary | ICD-10-CM | POA: Diagnosis not present

## 2022-01-25 DIAGNOSIS — Z7189 Other specified counseling: Secondary | ICD-10-CM

## 2022-01-25 DIAGNOSIS — K7460 Unspecified cirrhosis of liver: Secondary | ICD-10-CM

## 2022-01-25 LAB — COMPREHENSIVE METABOLIC PANEL
ALT: 174 U/L — ABNORMAL HIGH (ref 0–44)
ALT: 151 U/L — ABNORMAL HIGH (ref 0–44)
ALT: 133 U/L — ABNORMAL HIGH (ref 0–44)
AST: 241 U/L — ABNORMAL HIGH (ref 15–41)
AST: 202 U/L — ABNORMAL HIGH (ref 15–41)
AST: 188 U/L — ABNORMAL HIGH (ref 15–41)
Albumin: 1.8 g/dL — ABNORMAL LOW (ref 3.5–5.0)
Albumin: 2.8 g/dL — ABNORMAL LOW (ref 3.5–5.0)
Albumin: 2.8 g/dL — ABNORMAL LOW (ref 3.5–5.0)
Alkaline Phosphatase: 104 U/L (ref 38–126)
Alkaline Phosphatase: 78 U/L (ref 38–126)
Alkaline Phosphatase: 66 U/L (ref 38–126)
Anion gap: 9 (ref 5–15)
Anion gap: 10 (ref 5–15)
Anion gap: 10 (ref 5–15)
BUN: 72 mg/dL — ABNORMAL HIGH (ref 6–20)
BUN: 75 mg/dL — ABNORMAL HIGH (ref 6–20)
BUN: 82 mg/dL — ABNORMAL HIGH (ref 6–20)
CO2: 18 mmol/L — ABNORMAL LOW (ref 22–32)
CO2: 16 mmol/L — ABNORMAL LOW (ref 22–32)
CO2: 16 mmol/L — ABNORMAL LOW (ref 22–32)
Calcium: 7.2 mg/dL — ABNORMAL LOW (ref 8.9–10.3)
Calcium: 7.1 mg/dL — ABNORMAL LOW (ref 8.9–10.3)
Calcium: 7.1 mg/dL — ABNORMAL LOW (ref 8.9–10.3)
Chloride: 99 mmol/L (ref 98–111)
Chloride: 99 mmol/L (ref 98–111)
Chloride: 101 mmol/L (ref 98–111)
Creatinine, Ser: 3.07 mg/dL — ABNORMAL HIGH (ref 0.61–1.24)
Creatinine, Ser: 3.64 mg/dL — ABNORMAL HIGH (ref 0.61–1.24)
Creatinine, Ser: 3.81 mg/dL — ABNORMAL HIGH (ref 0.61–1.24)
GFR, Estimated: 23 mL/min — ABNORMAL LOW (ref >60)
GFR, Estimated: 18 mL/min — ABNORMAL LOW (ref >60)
GFR, Estimated: 17 mL/min — ABNORMAL LOW (ref >60)
Glucose, Bld: 165 mg/dL — ABNORMAL HIGH (ref 70–99)
Glucose, Bld: 57 mg/dL — ABNORMAL LOW (ref 70–99)
Glucose, Bld: 103 mg/dL — ABNORMAL HIGH (ref 70–99)
Potassium: 6.1 mmol/L — ABNORMAL HIGH (ref 3.5–5.1)
Potassium: 5.1 mmol/L (ref 3.5–5.1)
Potassium: 5.9 mmol/L — ABNORMAL HIGH (ref 3.5–5.1)
Sodium: 126 mmol/L — ABNORMAL LOW (ref 135–145)
Sodium: 125 mmol/L — ABNORMAL LOW (ref 135–145)
Sodium: 127 mmol/L — ABNORMAL LOW (ref 135–145)
Total Bilirubin: 29.2 mg/dL (ref 0.3–1.2)
Total Bilirubin: 30.9 mg/dL (ref 0.3–1.2)
Total Bilirubin: 30.6 mg/dL (ref 0.3–1.2)
Total Protein: 4.7 g/dL — ABNORMAL LOW (ref 6.5–8.1)
Total Protein: 4.9 g/dL — ABNORMAL LOW (ref 6.5–8.1)
Total Protein: 4.8 g/dL — ABNORMAL LOW (ref 6.5–8.1)

## 2022-01-25 LAB — BODY FLUID CELL COUNT WITH DIFFERENTIAL
Lymphs, Fluid: 6 %
Monocyte-Macrophage-Serous Fluid: 36 % — ABNORMAL LOW (ref 50–90)
Neutrophil Count, Fluid: 58 % — ABNORMAL HIGH (ref 0–25)
Total Nucleated Cell Count, Fluid: 98 cu mm (ref 0–1000)

## 2022-01-25 LAB — CBC WITH DIFFERENTIAL/PLATELET
Abs Immature Granulocytes: 0.28 10*3/uL — ABNORMAL HIGH (ref 0.00–0.07)
Basophils Absolute: 0 10*3/uL (ref 0.0–0.1)
Basophils Relative: 0 %
Eosinophils Absolute: 0.1 10*3/uL (ref 0.0–0.5)
Eosinophils Relative: 1 %
HCT: 26.6 % — ABNORMAL LOW (ref 39.0–52.0)
Hemoglobin: 9.2 g/dL — ABNORMAL LOW (ref 13.0–17.0)
Immature Granulocytes: 2 %
Lymphocytes Relative: 4 %
Lymphs Abs: 0.5 10*3/uL — ABNORMAL LOW (ref 0.7–4.0)
MCH: 33.7 pg (ref 26.0–34.0)
MCHC: 34.6 g/dL (ref 30.0–36.0)
MCV: 97.4 fL (ref 80.0–100.0)
Monocytes Absolute: 1.5 10*3/uL — ABNORMAL HIGH (ref 0.1–1.0)
Monocytes Relative: 13 %
Neutro Abs: 9.3 10*3/uL — ABNORMAL HIGH (ref 1.7–7.7)
Neutrophils Relative %: 80 %
Platelets: 30 10*3/uL — ABNORMAL LOW (ref 150–400)
RBC: 2.73 MIL/uL — ABNORMAL LOW (ref 4.22–5.81)
RDW: 17.2 % — ABNORMAL HIGH (ref 11.5–15.5)
WBC: 11.7 10*3/uL — ABNORMAL HIGH (ref 4.0–10.5)
nRBC: 0 % (ref 0.0–0.2)

## 2022-01-25 LAB — PROTEIN, PLEURAL OR PERITONEAL FLUID: Total protein, fluid: <3 g/dL

## 2022-01-25 LAB — DIFFERENTIAL
Abs Immature Granulocytes: 0.09 10*3/uL — ABNORMAL HIGH (ref 0.00–0.07)
Basophils Absolute: 0 10*3/uL (ref 0.0–0.1)
Basophils Relative: 0 %
Eosinophils Absolute: 0.1 10*3/uL (ref 0.0–0.5)
Eosinophils Relative: 1 %
Immature Granulocytes: 1 %
Lymphocytes Relative: 4 %
Lymphs Abs: 0.4 10*3/uL — ABNORMAL LOW (ref 0.7–4.0)
Monocytes Absolute: 1 10*3/uL (ref 0.1–1.0)
Monocytes Relative: 10 %
Neutro Abs: 8.2 10*3/uL — ABNORMAL HIGH (ref 1.7–7.7)
Neutrophils Relative %: 84 %

## 2022-01-25 LAB — ALBUMIN, PLEURAL OR PERITONEAL FLUID: Albumin, Fluid: <1.5 g/dL

## 2022-01-25 LAB — CBC
HCT: 31.5 % — ABNORMAL LOW (ref 39.0–52.0)
Hemoglobin: 10.8 g/dL — ABNORMAL LOW (ref 13.0–17.0)
MCH: 33.3 pg (ref 26.0–34.0)
MCHC: 34.3 g/dL (ref 30.0–36.0)
MCV: 97.2 fL (ref 80.0–100.0)
Platelets: 49 10*3/uL — ABNORMAL LOW (ref 150–400)
RBC: 3.24 MIL/uL — ABNORMAL LOW (ref 4.22–5.81)
RDW: 17.5 % — ABNORMAL HIGH (ref 11.5–15.5)
WBC: 15.9 10*3/uL — ABNORMAL HIGH (ref 4.0–10.5)
nRBC: 0.1 % (ref 0.0–0.2)

## 2022-01-25 LAB — PROTIME-INR
INR: 2.9 — ABNORMAL HIGH (ref 0.8–1.2)
Prothrombin Time: 29.9 seconds — ABNORMAL HIGH (ref 11.4–15.2)

## 2022-01-25 LAB — GLUCOSE, CAPILLARY
Glucose-Capillary: 175 mg/dL — ABNORMAL HIGH (ref 70–99)
Glucose-Capillary: 51 mg/dL — ABNORMAL LOW (ref 70–99)
Glucose-Capillary: 95 mg/dL (ref 70–99)
Glucose-Capillary: 116 mg/dL — ABNORMAL HIGH (ref 70–99)
Glucose-Capillary: <10 mg/dL — CL (ref 70–99)
Glucose-Capillary: 100 mg/dL — ABNORMAL HIGH (ref 70–99)
Glucose-Capillary: 108 mg/dL — ABNORMAL HIGH (ref 70–99)
Glucose-Capillary: 117 mg/dL — ABNORMAL HIGH (ref 70–99)
Glucose-Capillary: 173 mg/dL — ABNORMAL HIGH (ref 70–99)

## 2022-01-25 LAB — LACTIC ACID, PLASMA: Lactic Acid, Venous: 2.7 mmol/L (ref 0.5–1.9)

## 2022-01-25 LAB — SODIUM, URINE, RANDOM: Sodium, Ur: 11 mmol/L

## 2022-01-25 LAB — CREATININE, URINE, RANDOM: Creatinine, Urine: 63 mg/dL

## 2022-01-25 LAB — AMMONIA: Ammonia: 55 umol/L — ABNORMAL HIGH (ref 9–35)

## 2022-01-25 LAB — CORTISOL-AM, BLOOD: Cortisol - AM: 14.1 ug/dL (ref 6.7–22.6)

## 2022-01-25 LAB — PROCALCITONIN: Procalcitonin: 0.98 ng/mL

## 2022-01-25 MED ORDER — INSULIN ASPART 100 UNIT/ML IV SOLN
10.0000 [IU] | Freq: Once | INTRAVENOUS | Status: AC
  Administered 2022-01-25: 10 [IU] via INTRAVENOUS

## 2022-01-25 MED ORDER — INSULIN ASPART 100 UNIT/ML IV SOLN
5.0000 [IU] | Freq: Once | INTRAVENOUS | Status: AC
  Administered 2022-01-25: 5 [IU] via INTRAVENOUS

## 2022-01-25 MED ORDER — DEXTROSE 50 % IV SOLN
1.0000 | Freq: Once | INTRAVENOUS | Status: AC
  Administered 2022-01-25: 50 mL via INTRAVENOUS
  Filled 2022-01-25: qty 50

## 2022-01-25 MED ORDER — LACTULOSE 10 GM/15ML PO SOLN
20.0000 g | Freq: Two times a day (BID) | ORAL | Status: DC
  Administered 2022-01-25: 20 g via ORAL
  Filled 2022-01-25: qty 30

## 2022-01-25 MED ORDER — DEXTROSE 50 % IV SOLN
25.0000 g | INTRAVENOUS | Status: AC
  Administered 2022-01-25: 25 g via INTRAVENOUS

## 2022-01-25 MED ORDER — LIDOCAINE HCL 1 % IJ SOLN
INTRAMUSCULAR | Status: AC
  Administered 2022-01-25: 10 mL
  Filled 2022-01-25: qty 20

## 2022-01-25 MED ORDER — DEXTROSE-NACL 5-0.9 % IV SOLN: INTRAVENOUS | Status: DC

## 2022-01-25 MED ORDER — DEXTROSE 50 % IV SOLN
INTRAVENOUS | Status: AC
  Administered 2022-01-25: 50 mL via INTRAVENOUS
  Filled 2022-01-25: qty 50

## 2022-01-25 MED ORDER — LACTULOSE 10 GM/15ML PO SOLN
20.0000 g | Freq: Three times a day (TID) | ORAL | Status: DC
  Administered 2022-01-25 – 2022-01-26 (×2): 20 g via ORAL
  Filled 2022-01-25 (×2): qty 30

## 2022-01-25 MED ORDER — RIFAXIMIN 550 MG PO TABS
550.0000 mg | ORAL_TABLET | Freq: Two times a day (BID) | ORAL | Status: DC
  Administered 2022-01-25 – 2022-01-29 (×7): 550 mg via ORAL
  Filled 2022-01-25 (×7): qty 1

## 2022-01-25 MED ORDER — SODIUM ZIRCONIUM CYCLOSILICATE 10 G PO PACK
10.0000 g | PACK | Freq: Four times a day (QID) | ORAL | Status: AC
  Administered 2022-01-25 (×2): 10 g via ORAL
  Filled 2022-01-25 (×2): qty 1

## 2022-01-25 MED ORDER — SODIUM CHLORIDE 0.9 % IV SOLN: 2.0000 g | INTRAVENOUS | Status: DC

## 2022-01-25 MED ORDER — SODIUM CHLORIDE 0.9% IV SOLUTION: Freq: Once | INTRAVENOUS | Status: AC

## 2022-01-25 MED ORDER — ALBUMIN HUMAN 25 % IV SOLN
90.0000 g | Freq: Every day | INTRAVENOUS | Status: AC
  Administered 2022-01-25: 87.5 g via INTRAVENOUS
  Administered 2022-01-26: 87.58 g via INTRAVENOUS
  Filled 2022-01-25 (×3): qty 350

## 2022-01-25 MED ORDER — DEXTROSE 50 % IV SOLN: 1.0000 | Freq: Once | INTRAVENOUS | Status: AC

## 2022-01-25 NOTE — Assessment & Plan Note (Addendum)
-   lower suspicion for infection given negative paracentesis for SBP; no other sources at this time. - suspect elevated lactic acid from liver disease and hypoperfusion possibly even - suspect leukocytosis from recent steroid use; only 1% bands on diff from 9/30; will add on diff to 10/1 specimen too - d/c abx and monitor off

## 2022-01-25 NOTE — Assessment & Plan Note (Addendum)
-   baseline creatinine ~ 1 - patient presents with increase in creat >0.3 mg/dL above baseline, creat increase >1.5x baseline presumed to have occurred within past 7 days PTA - differential includes prerenal from hypoperfusion and GI losses vs contrast injury vs HRS vs combo -Renal ultrasound negative for hydronephrosis.  Chronic medical renal disease appreciated.  Abnormal wall thickening along dome of bladder measuring 1.1 cm (may need eventual cystoscopy to exclude mass) -Check urine sodium, urine creatinine, urine osmolality -Continue IV albumin - pending urine studies, might start fluids

## 2022-01-25 NOTE — Hospital Course (Signed)
Leroy Bell is a 61 yo male with PMH alcoholic cirrhosis with ascites, hepatic encephalopathy, DMII, HLD, HTN.  He was recently hospitalized from 01/09/2022 until 01/21/2022 for upper GI bleed. He underwent EGD during hospitalization which showed small varices, erosive esophagitis, and portal gastropathy. Per patient and family he has reportedly been abstinent from alcohol since original hospitalization starting on 01/09/2022. After returning home from the hospital, he developed rectal bleeding associated with bright red blood and was brought back to the hospital. He has a lingering confusion. He had ongoing elevated LFTs including uptrending total bili.  Renal function also showed worsening with associated hyperkalemia. He underwent CTA GI bleed work-up which was negative for active hemorrhage.  Multiple esophageal, paraesophageal, and perigastric varices noted. He was admitted for GI evaluation and further work-up.

## 2022-01-25 NOTE — Assessment & Plan Note (Signed)
-   offer CPAP nightly

## 2022-01-25 NOTE — Assessment & Plan Note (Signed)
-   statin contraindicated

## 2022-01-25 NOTE — Progress Notes (Signed)
[facility] Progress Note  SUBJECTIVE:   Interval history: Leroy Bell was seen and evaluated today at bedside. CT angiogram abdomen/pelvis with no sign of active bleeding. Per nursing staff, had a bloody bowel movement this AM. Discussed with family that he would not likely tolerate a bowel preparation at this time for colonoscopy. Elevated creatinine noted, random urine sodium obtained and 11, he is on albumin trial 87.5 gm, no diuretic therapy. Serum bilirubin is elevated from yesterday, prior to admission steroids stopped in setting of GI bleed. He has had no nausea or emesis. He had a paracentesis today which removed 5.1 liters, PMN count 56, no sign of spontaneous bacterial peritonitis at this time. He is more confused and lethargic today, he was started on lactulose 20 gm BID and rifaximin 550 mg PO Q12Hr. He remains on pantoprazole infusion and octreotide in setting of known varices and rectal bleeding. He recently had hypoglycemic episode which is improving with therapy. INR today is 2.9, he received vitamin K.  At bedside earlier this AM, he was grossly icteric. He was resting in bed with his family at bedside. He was awake and alert to person, place, though not to time. He had lower extremity edema. He had some mild tremor of outstretched hands. He was following commands.  Past Medical History:  Diagnosis Date   Alcohol abuse    Coronary atherosclerosis due to calcified coronary lesion    Diabetes ([name])    Dyspnea    from anemia   Hyperlipidemia    Hypertension    Past Surgical History:  Procedure Laterality Date   ESOPHAGOGASTRODUODENOSCOPY N/A 01/14/2022   Procedure: ESOPHAGOGASTRODUODENOSCOPY (EGD);  Surgeon: Willis Modena, MD;  Location: [facility];  Service: Gastroenterology;  Laterality: N/A;   NO PAST SURGERIES     Current Facility-Administered Medications  Medication Dose Route Frequency Provider Last Rate Last Admin   0.9 %  sodium chloride infusion   10 mL/hr Intravenous Once Margie Ege A, DO   Held at 01/06/2022 4259   albumin human 25 % solution 87.5 g  87.5 g Intravenous Daily Lewie Chamber, MD   Stopped at 01/25/22 1307   Chlorhexidine Gluconate Cloth 2 % PADS 6 each  6 each Topical Daily Ronaldo Miyamoto, Tyrone A, DO   6 each at 01/25/22 1008   dextrose 5 %-0.9 % sodium chloride infusion   Intravenous Continuous Lewie Chamber, MD 75 mL/hr at 01/25/22 1700 Infusion Verify at 01/25/22 1700   insulin aspart (novoLOG) injection 0-5 Units  0-5 Units Subcutaneous QHS Kyle, Tyrone A, DO       insulin aspart (novoLOG) injection 0-9 Units  0-9 Units Subcutaneous TID WC Kyle, Tyrone A, DO   2 Units at 01/25/22 0852   lactulose (CHRONULAC) 10 GM/15ML solution 20 g  20 g Oral BID Lewie Chamber, MD   20 g at 01/25/22 1339   octreotide (SANDOSTATIN) 500 mcg in sodium chloride 0.9 % 250 mL (2 mcg/mL) infusion  50 mcg/hr Intravenous Continuous Kyle, Tyrone A, DO 25 mL/hr at 01/25/22 1700 50 mcg/hr at 01/25/22 1700   Oral care mouth rinse  15 mL Mouth Rinse PRN Ronaldo Miyamoto, Tyrone A, DO       pantoprozole (PROTONIX) 80 mg /NS 100 mL infusion  8 mg/hr Intravenous Continuous Kyle, Tyrone A, DO 10 mL/hr at 01/25/22 1700 8 mg/hr at 01/25/22 1700   rifaximin (XIFAXAN) tablet 550 mg  550 mg Oral BID Liliane Shi H, DO       Allergies as of 12/27/2021 -  Review Complete 12/28/2021  Allergen Reaction Noted   Penicillins Hives 08/23/2018   Review of Systems:  Review of Systems  Respiratory:  Negative for shortness of breath.   Cardiovascular:  Negative for chest pain.  Gastrointestinal:  Negative for abdominal pain.    OBJECTIVE:   Temp:  [95.8 F (35.4 C)-97.4 F (36.3 C)] 97.3 F (36.3 C) (10/01 1218) Pulse Rate:  [62-78] 64 (10/01 1700) Resp:  [18-41] 20 (10/01 1700) BP: (84-163)/(44-66) 154/46 (10/01 1700) SpO2:  [73 %-100 %] 96 % (10/01 1700) FiO2 (%):  [28 %] 28 % (09/30 2215) Last BM Date : 01/25/22 Physical Exam Eyes:     General: Scleral icterus  present.  Cardiovascular:     Rate and Rhythm: Normal rate and regular rhythm.  Pulmonary:     Effort: No respiratory distress.     Breath sounds: Normal breath sounds.  Abdominal:     General: Bowel sounds are normal. There is distension (mild).     Palpations: Abdomen is soft.  Musculoskeletal:     Right lower leg: Edema present.     Left lower leg: Edema present.  Neurological:     Mental Status: He is alert.     Comments: Oriented to person and place, not oriented to time. Mild tremor of outstretched hands.     Labs: Recent Labs    01/02/2022 0411 01/10/2022 2056 01/25/22 0039  WBC 18.2*  --  15.9*  HGB 10.6* 10.9* 10.8*  HCT 32.3* 33.5* 31.5*  PLT 78*  --  49*   BMET Recent Labs    01/23/2022 2056 01/25/22 0039 01/25/22 1122  NA 125* 126* 125*  K 5.9* 6.1* 5.1  CL 99 99 99  CO2 17* 18* 16*  GLUCOSE 174* 165* 57*  BUN 71* 72* 75*  CREATININE 2.88* 3.07* 3.64*  CALCIUM 6.9* 7.2* 7.1*   LFT Recent Labs    01/25/22 1122  PROT 4.9*  ALBUMIN 2.8*  AST 202*  ALT 151*  ALKPHOS 78  BILITOT 30.9*   PT/INR Recent Labs    01/02/2022 0411 01/25/22 0039  LABPROT 25.1* 29.9*  INR 2.3* 2.9*   Diagnostic imaging: US Paracentesis  Result Date: 01/25/2022 INDICATION: Patient with history of alcohol abuse presents with abdominal distension, jaundice, previous CT showed ascites. Request for therapeutic and diagnostic paracentesis. EXAM: ULTRASOUND GUIDED  PARACENTESIS MEDICATIONS: 10 mL 1% lidocaine COMPLICATIONS: None immediate. PROCEDURE: Informed written consent was obtained from the patient after a discussion of the risks, benefits and alternatives to treatment. A timeout was performed prior to the initiation of the procedure. Initial ultrasound scanning demonstrates a large amount of ascites within the right lower abdominal quadrant. The right lower abdomen was prepped and draped in the usual sterile fashion. 1% lidocaine was used for local anesthesia. Following this, a  19 gauge, 7-cm, Yueh catheter was introduced. An ultrasound image was saved for documentation purposes. The paracentesis was performed. The catheter was removed and a dressing was applied. The patient tolerated the procedure well without immediate post procedural complication. FINDINGS: A total of approximately 5.1 L of hazy, golden colored fluid was removed. Samples were sent to the laboratory as requested by the clinical team. IMPRESSION: Successful ultrasound-guided paracentesis yielding 5.1 liters of peritoneal fluid. PLAN: If the patient eventually requires >/=2 paracenteses in a 30 day period, candidacy for formal evaluation by the [facility] will be assessed. Read by: Lawernce Ion, PA-C Electronically Signed   By: Gilmer Mor D.O.  On: 01/25/2022 10:56   CT ANGIO GI BLEED  Result Date: Feb 05, 2022 CLINICAL DATA:  Hematochezia, coffee-ground emesis. Prior bloody stool and abdominal distention. EXAM: CTA ABDOMEN AND PELVIS WITHOUT AND WITH CONTRAST TECHNIQUE: Multidetector CT imaging of the abdomen and pelvis was performed using the standard protocol during bolus administration of intravenous contrast. Multiplanar reconstructed images and MIPs were obtained and reviewed to evaluate the vascular anatomy. RADIATION DOSE REDUCTION: This exam was performed according to the departmental dose-optimization program which includes automated exposure control, adjustment of the mA and/or kV according to patient size and/or use of iterative reconstruction technique. CONTRAST:  174mL OMNIPAQUE IOHEXOL 350 MG/ML SOLN COMPARISON:  06/29/2020. FINDINGS: VASCULAR Aorta: Aortic atherosclerosis. Normal caliber aorta without aneurysm, dissection, vasculitis or significant stenosis. Celiac: The celiac artery and SMA branch from the same trunk which is a normal variant. The arteries are patent without evidence of aneurysm, dissection, vasculitis, or significant stenosis. SMA:  The celiac artery and SMA branch from the same trunk which is a normal variant. The arteries are patent without evidence of aneurysm, dissection, vasculitis, or significant stenosis. Renals: Both renal arteries are patent without evidence of aneurysm, dissection, vasculitis, fibromuscular dysplasia or significant stenosis. IMA: Patent without evidence of aneurysm, dissection, vasculitis or significant stenosis. Inflow: Patent without evidence of aneurysm, dissection, vasculitis or significant stenosis. Proximal Outflow: Bilateral common femoral and visualized portions of the superficial and profunda femoral arteries are patent without evidence of aneurysm, dissection, vasculitis or significant stenosis. Veins: Multiple esophageal, periesophageal and perigastric varices are noted in the upper abdomen. The portal vein, splenic vein, and superior mesenteric vein are patent. Review of the MIP images confirms the above findings. NON-VASCULAR Lower chest: Heart is enlarged and there is a small pericardial effusion. Multi-vessel coronary artery calcifications are noted. There is a small left pleural effusion and trace right pleural effusion with atelectasis at the lung bases bilaterally. Multiple varices are noted in the periesophageal region in the lower thorax. Hepatobiliary: No focal abnormality. The liver has a nodular contour compatible with cirrhosis. No biliary ductal dilatation. The gallbladder is contracted. Pancreas: Unremarkable. No pancreatic ductal dilatation or surrounding inflammatory changes. Spleen: The spleen is enlarged at 13.4 cm. Adrenals/Urinary Tract: The adrenal glands are within normal limits. The kidneys enhance symmetrically. Multiple scattered hypodensities are present in the kidneys, possible cysts. No renal calculus or hydronephrosis. Stomach/Bowel: Multiple esophageal and perigastric varices. There is thickening of the gastric rugae. Hyperdense material is present at the gastric fundus and  proximal small bowel, possible contrast or other ingested material. No obvious hemorrhage is seen. No bowel obstruction, free air, or pneumatosis. The appendix is not seen. Lymphatic: No abdominal or pelvic lymphadenopathy. Reproductive: Prostate is unremarkable. Other: Moderate ascites. Musculoskeletal: Mild anasarca is noted. No acute or suspicious osseous abnormality. IMPRESSION: VASCULAR 1. No evidence of active hemorrhage. 2. Multiple esophageal, paraesophageal and perigastric varices. 3. Aortic atherosclerosis. NON-VASCULAR 1. Hyperdense material in the stomach and proximal small bowel, which may represent ingested material. Clinical correlation is recommended. No obvious hemorrhage is identified. 2. Morphologic changes of cirrhosis and portal hypertension. 3. Moderate ascites and anasarca. 4. Small left pleural effusion and trace right pleural effusion with atelectasis at the lung bases. 5. Scattered hypodensities in the kidneys bilaterally, previously characterized as cysts. Electronically Signed   By: Brett Fairy M.D.   On: 2022-02-05 21:05   US RENAL  Result Date: February 05, 2022 CLINICAL DATA:  Acute kidney injury. EXAM: RENAL / URINARY TRACT ULTRASOUND COMPLETE COMPARISON:  None Available. FINDINGS: Right  Kidney: Renal measurements: 11.7 x 5.0 x 6.5 cm = volume: 195.5 mL. Increased parenchymal echogenicity. No mass or hydronephrosis visualized. Left Kidney: Renal measurements: 10.4 x 5.4 x 6.3 cm = volume: 182.9 mL. Echogenicity within normal limits. No mass or hydronephrosis visualized. Bladder: The ureteral jets are not visualized. There is abnormal wall thickening identified along the dome of the bladder measuring 1.1 cm. Other: None. IMPRESSION: 1. No hydronephrosis. 2. Increased parenchymal echogenicity of the right kidney suggestive of chronic medical renal disease. 3. Abnormal wall thickening along the dome of the bladder measuring 1.1 cm. Cannot exclude a bladder mass. Consider further  evaluation with cystoscopy. Electronically Signed   By: Signa Kell M.D.   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             - Decompensated by esophageal varices, hepatic encephalopathy, ascites  - S/p paracentesis 10/1 removed 5.1 L, PMN 56, no sign SBP  - MELD 3.0: 40 at 01/25/2022 11:22 AM MELD-Na: 42 at 01/25/2022 11:22 AM Calculated from: Serum Creatinine: 3.64 mg/dL (Using max of 3 mg/dL) at 03/02/5207 02:23 AM Serum Sodium: 125 mmol/L at 01/25/2022 11:22 AM Total Bilirubin: 30.9 mg/dL at 36/04/2242 97:53 AM Serum Albumin: 2.8 g/dL at 00/08/1100 11:17 AM INR(ratio): 2.9 at 01/25/2022 12:39 AM Age at listing (hypothetical): 77 years Sex: Male at 01/25/2022 11:22 AM Alcoholic hepatitis  - Elevated Maddrey discriminant function  - Steroids on hold in setting of GI bleeding Hepatic encephalopathy   - Lactulose 20 gm BID  - Rifaximin 550 mg Q12Hr Acute kidney injury, concern for hepatorenal syndrome  - Urine sodium 11  - Albumin trial today Elevated INR  - Increased today, received vitamin K Lactic acidosis Hyperbilirubinemia, worsened Hyperkalemia Leukocytosis Thrombocytopenia Personal history colon polyps  PLAN: - Clinical status worsened today, I personally have contacted [facility] to evaluate if patient is a liver transplant candidate, spoke to Dr. Titus Dubin, patient is NOT currently a liver transplant candidate (given altered mentation and recent alcohol use). If he survives current hospitalization, patient is recommended to follow up with [facility] in the outpatient setting to discuss transplant candidacy/options. - Continue albumin challenge, trend serum Cr, urine output, may benefit from Nephrology consult - Continue pantoprazole infusion, octreotide  infusion, do not suspect brisk upper GI bleeding  - Correct INR as able, related to underlying liver dysfunction - Increase lactulose dosing today, maintain rifaximin 550 mg Q12Hr - Follow up final paracentesis fluid study results - Poor prognosis, discussed at length with patient's spouse today - GI will follow   LOS: 1 day   Liliane Shi, [facility]

## 2022-01-25 NOTE — Procedures (Signed)
PROCEDURE SUMMARY:  Successful image-guided paracentesis from the right lower abdomen.  Yielded 5.1 liters of hazy, golden colored fluid.  No immediate complications.  EBL = trace. Patient tolerated well.   Specimen was  sent for labs.  Please see imaging section of Epic for full dictation.   Hazen PA-C 01/25/2022 9:55 AM

## 2022-01-25 NOTE — Assessment & Plan Note (Signed)
-   BP remaining stable for now - Hold off on any antihypertensives

## 2022-01-25 NOTE — Assessment & Plan Note (Addendum)
-   NH3 55, uptrending LFTs and INR - MELD-Na 40 pts with 71.3% mortality. Maddrey DF 111 pts (also poor prognosis) - start albumin 1g/kg x 2 days and monitor response - steroid on hold in setting of GIB - overall poor prognosis and have relayed this to patient and family; he wishes to remain full code at this time -Continue lactulose and rifaximin -GI following closely as well.  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

## 2022-01-25 NOTE — Assessment & Plan Note (Signed)
-   patient reports abstinence since last admission - cirrhosis etiology due to chronic alcohol abuse

## 2022-01-25 NOTE — Assessment & Plan Note (Signed)
-   presumed due to AKI - okay for lokelma use; discussed with GI - continue with insulin and lokelma for now - holding lasix in setting of renal issues - trend renal function and K

## 2022-01-25 NOTE — Assessment & Plan Note (Signed)
-   patient still with decompensated cirrhosis with some worsening clinically since discharge and ongoing GIB concern - extremely high MELD-Na and MDF scores consistent with very poor prognosis - family and patient aware of poor prognosis; patient does wish for full code still but family would help make decisions if he were to decompensate -Unfortunately, patient continues to decline, rather rapidly today with profuse rectal bleeding and ensuing hypotension.  Unfortunately he also continues to endorse wanting to remain full code.  Family present and planning for de-escalating care once patient no longer able to make decisions

## 2022-01-25 NOTE — Assessment & Plan Note (Signed)
-   suspected from decompensated cirrhosis - continue trending

## 2022-01-25 NOTE — Assessment & Plan Note (Signed)
-   Platelets down to 49k today.  This is a downtrend from prior values of 60-90k -Will repeat CBC again this afternoon and if remaining less than 50k, will consider transfusion at that time given GIB

## 2022-01-25 NOTE — Assessment & Plan Note (Addendum)
-   multifactorial in setting of GI losses, AKI, and decompensated cirrhosis with elevated lactic

## 2022-01-25 NOTE — Assessment & Plan Note (Signed)
-   Currently hypoglycemic -Continue treating as necessary -Last A1c 5.9% on 01/21/2022

## 2022-01-25 NOTE — Progress Notes (Signed)
PHARMACY NOTE:  ANTIMICROBIAL RENAL DOSAGE ADJUSTMENT  Current antimicrobial regimen includes a mismatch between antimicrobial dosage and estimated renal function.  As per policy approved by the Pharmacy & Therapeutics and Medical Executive Committees, the antimicrobial dosage will be adjusted accordingly.  Current antimicrobial dosage:  Cefepime 2gm IV q12h    Indication: intra-abdominal infection  Renal Function:  Estimated Creatinine Clearance: 27.5 mL/min (A) (by C-G formula based on SCr of 3.07 mg/dL (H)).     Antimicrobial dosage has been changed to:  Cefepime 2gm IV q24h    Thank you for allowing pharmacy to be a part of this patient's care.  Everette Rank, [facility] 01/25/2022 2:51 AM

## 2022-01-25 NOTE — Assessment & Plan Note (Addendum)
-   Prior hospitalization was considered from upper GIB (found to have grade 1 varices unable to be banded, grade D esophagitis, moderate portal hypertensive gastropathy) -Now presenting with bright red blood per rectum - may be spontaneous from poor synthetic function in setting of liver failure vs further secondary varices/recannualization in lower GI tract -CT angio negative for active bleeding -Hemoglobin relatively stable currently and just slightly decreased from prior values at discharge; continue trending Hgb -Remains hemodynamically stable as well

## 2022-01-25 NOTE — Progress Notes (Signed)
Progress Note    Leroy Bell   [id number]  DOB: [birth year]  DOA: 12/28/2021     1 PCP: Janith Lima, MD  Initial CC: [facility] Course: Leroy Bell is a 61 yo male with PMH alcoholic cirrhosis with ascites, hepatic encephalopathy, DMII, HLD, HTN.  He was recently hospitalized from 01/09/2022 until 01/21/2022 for upper GI bleed. He underwent EGD during hospitalization which showed small varices, erosive esophagitis, and portal gastropathy. Per patient and family he has reportedly been abstinent from alcohol since original hospitalization starting on 01/09/2022. After returning home from the hospital, he developed rectal bleeding associated with bright red blood and was brought back to the hospital. He has a lingering confusion. He had ongoing elevated LFTs including uptrending total bili.  Renal function also showed worsening with associated hyperkalemia. He underwent CTA GI bleed work-up which was negative for active hemorrhage.  Multiple esophageal, paraesophageal, and perigastric varices noted. He was admitted for GI evaluation and further work-up.  Interval History:  Seen this morning after returning from paracentesis.  Wife and son bedside.  Extensive update given to both patient and his family bedside.  Poor prognosis discussed.  Wishes to remain full code at this time.  Treatment plan discussed and cautious monitoring going forward.  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 [place] DF 111 pts (also poor prognosis) - start albumin 1g/kg x 2 days and monitor response - steroid on hold in setting of GIB - overall poor prognosis and have relayed this to patient and family; he wishes to remain full code at this time - start lactulose; hold rifaximin given interactions for now  SIRS (systemic inflammatory response syndrome) ([place]) - lower suspicion for infection given negative paracentesis for SBP; no other sources at this time. - suspect elevated lactic acid from liver disease and hypoperfusion possibly even - suspect leukocytosis from recent steroid use; only 1% bands on diff from 9/30; will add on diff to 10/1 specimen too - d/c abx and monitor off  Hyponatremia - suspected from decompensated cirrhosis - continue trending  Hyperkalemia - presumed due to AKI - okay for lokelma use; discussed with GI - continue with insulin and lokelma for now - holding lasix in setting of renal issues - trend renal function and K  AKI (acute kidney injury) ([place]) - baseline creatinine ~ 1 - patient presents with increase in creat >0.3 mg/dL above baseline, creat increase >1.5x baseline presumed to have occurred within past 7 days PTA - differential includes prerenal from hypoperfusion and GI losses vs contrast injury vs HRS vs combo -Renal ultrasound negative for hydronephrosis.  Chronic medical renal disease appreciated.  Abnormal wall thickening along dome of bladder measuring 1.1 cm (may need eventual cystoscopy to exclude mass) -Check urine sodium, urine creatinine, urine osmolality -Continue IV albumin - pending urine studies, might start fluids  Goals of care, counseling/discussion - patient still with decompensated cirrhosis  with some worsening clinically since discharge and ongoing GIB concern -  extremely high MELD-Na and MDF scores consistent with very poor prognosis - family and patient aware of poor prognosis; patient does wish for full code still but family would help make decisions if he were to decompensate - continue monitoring clinical course   Thrombocytopenia ([place]) - Platelets down to 49k today.  This is a downtrend from prior values of 60-90k -Will repeat CBC again this afternoon and if remaining less than 50k, will consider transfusion at that time given GIB  Alcohol abuse - patient reports abstinence since last admission - cirrhosis etiology due to chronic alcohol abuse   Metabolic acidosis - multifactorial in setting of GI losses, AKI, and decompensated cirrhosis with elevated lactic   Controlled type 2 diabetes mellitus without complication, without long-term current use of insulin ([place]) - Currently hypoglycemic -Continue treating as necessary -Last A1c 5.9% on 01/21/2022  Essential hypertension - BP remaining stable for now - Hold off on any antihypertensives  Hyperlipidemia LDL goal <70 - statin contraindicated  OSA on CPAP - offer CPAP nightly    Old records reviewed in assessment of this patient  Antimicrobials:   DVT prophylaxis:  SCDs Start: 01/16/2022 1220   Code Status:   Code Status: Full Code  Mobility Assessment (last 72 hours)     Mobility Assessment   No documentation.           Barriers to discharge: none Disposition Plan:  Pending clinical course  Status is: Inpt  Objective: Blood pressure (!) 136/49, pulse 72, temperature (!) 97.3 F (36.3 C), temperature source Oral, resp. rate (!) 26, height 5\' 7"  (1.702 m), weight 88.5 kg, SpO2 97 %.  Examination:  Physical Exam Constitutional:      Comments: Ill-appearing adult gentleman lying in bed in no distress with some confusion appreciated.  He is grossly jaundiced  HENT:     Head: Normocephalic and atraumatic.     Mouth/Throat:     Mouth: Mucous membranes are moist.   Eyes:     General: Scleral icterus present.     Extraocular Movements: Extraocular movements intact.  Cardiovascular:     Rate and Rhythm: Normal rate and regular rhythm.  Pulmonary:     Effort: Pulmonary effort is normal.     Breath sounds: Normal breath sounds.  Abdominal:     General: Bowel sounds are normal. There is no distension.     Palpations: Abdomen is soft.     Tenderness: There is no abdominal tenderness.     Comments: Obese but no tenderness or distention  Musculoskeletal:        General: Normal range of motion.     Cervical back: Normal range of motion and neck supple.  Skin:    General: Skin is warm and dry.     Coloration: Skin is jaundiced.  Neurological:     Comments: Oriented to name.  Unable to state the year, president, or where he is.  Tremors appreciated in upper extremity but no true asterixis      Consultants:  GI  Procedures:    Data Reviewed: Results for orders placed or performed during the hospital encounter of 01/16/2022 (from the past 24 hour(s))  Glucose, capillary     Status: Abnormal   Collection Time: 01/09/2022  4:49 PM  Result Value Ref Range   Glucose-Capillary 160 (H) 70 - 99 mg/dL   Comment 1 Notify RN    Comment 2 Document in Chart  Renal function panel     Status: Abnormal   Collection Time: 01/19/2022  8:56 PM  Result Value Ref Range   Sodium 125 (L) 135 - 145 mmol/L   Potassium 5.9 (H) 3.5 - 5.1 mmol/L   Chloride 99 98 - 111 mmol/L   CO2 17 (L) 22 - 32 mmol/L   Glucose, Bld 174 (H) 70 - 99 mg/dL   BUN 71 (H) 6 - 20 mg/dL   Creatinine, Ser 9.67 (H) 0.61 - 1.24 mg/dL   Calcium 6.9 (L) 8.9 - 10.3 mg/dL   Phosphorus 8.4 (H) 2.5 - 4.6 mg/dL   Albumin 1.7 (L) 3.5 - 5.0 g/dL   GFR, Estimated 24 (L) >60 mL/min   Anion gap 9 5 - 15  Hemoglobin and hematocrit, blood     Status: Abnormal   Collection Time: 01/20/2022  8:56 PM  Result Value Ref Range   Hemoglobin 10.9 (L) 13.0 - 17.0 g/dL   HCT 59.1 (L) 63.8 - 46.6 %  Glucose,  capillary     Status: Abnormal   Collection Time: 01/21/2022  9:53 PM  Result Value Ref Range   Glucose-Capillary 170 (H) 70 - 99 mg/dL  Glucose, capillary     Status: Abnormal   Collection Time: 01/03/2022 11:53 PM  Result Value Ref Range   Glucose-Capillary 150 (H) 70 - 99 mg/dL   Comment 1 Notify RN    Comment 2 Document in Chart   Protime-INR     Status: Abnormal   Collection Time: 01/25/22 12:39 AM  Result Value Ref Range   Prothrombin Time 29.9 (H) 11.4 - 15.2 seconds   INR 2.9 (H) 0.8 - 1.2  Cortisol-am, blood     Status: None   Collection Time: 01/25/22 12:39 AM  Result Value Ref Range   Cortisol - AM 14.1 6.7 - 22.6 ug/dL  Procalcitonin     Status: None   Collection Time: 01/25/22 12:39 AM  Result Value Ref Range   Procalcitonin 0.98 ng/mL  Comprehensive metabolic panel     Status: Abnormal   Collection Time: 01/25/22 12:39 AM  Result Value Ref Range   Sodium 126 (L) 135 - 145 mmol/L   Potassium 6.1 (H) 3.5 - 5.1 mmol/L   Chloride 99 98 - 111 mmol/L   CO2 18 (L) 22 - 32 mmol/L   Glucose, Bld 165 (H) 70 - 99 mg/dL   BUN 72 (H) 6 - 20 mg/dL   Creatinine, Ser 5.99 (H) 0.61 - 1.24 mg/dL   Calcium 7.2 (L) 8.9 - 10.3 mg/dL   Total Protein 4.7 (L) 6.5 - 8.1 g/dL   Albumin 1.8 (L) 3.5 - 5.0 g/dL   AST 357 (H) 15 - 41 U/L   ALT 174 (H) 0 - 44 U/L   Alkaline Phosphatase 104 38 - 126 U/L   Total Bilirubin 29.2 (HH) 0.3 - 1.2 mg/dL   GFR, Estimated 23 (L) >60 mL/min   Anion gap 9 5 - 15  CBC     Status: Abnormal   Collection Time: 01/25/22 12:39 AM  Result Value Ref Range   WBC 15.9 (H) 4.0 - 10.5 K/uL   RBC 3.24 (L) 4.22 - 5.81 MIL/uL   Hemoglobin 10.8 (L) 13.0 - 17.0 g/dL   HCT 01.7 (L) 79.3 - 90.3 %   MCV 97.2 80.0 - 100.0 fL   MCH 33.3 26.0 - 34.0 pg   MCHC 34.3 30.0 - 36.0 g/dL   RDW 00.9 (H) 23.3 - 00.7 %  Platelets 49 (L) 150 - 400 K/uL   nRBC 0.1 0.0 - 0.2 %  Lactic acid, plasma     Status: Abnormal   Collection Time: 01/25/22 12:39 AM  Result Value Ref  Range   Lactic Acid, Venous 2.7 (HH) 0.5 - 1.9 mmol/L  Glucose, capillary     Status: Abnormal   Collection Time: 01/25/22  8:01 AM  Result Value Ref Range   Glucose-Capillary 175 (H) 70 - 99 mg/dL  Body fluid cell count with differential     Status: Abnormal   Collection Time: 01/25/22 10:33 AM  Result Value Ref Range   Fluid Type-FCT CYTO PERI    Color, Fluid YELLOW (A) YELLOW   Appearance, Fluid CLEAR CLEAR   Total Nucleated Cell Count, Fluid 98 0 - 1,000 cu mm   Neutrophil Count, Fluid 58 (H) 0 - 25 %   Lymphs, Fluid 6 %   Monocyte-Macrophage-Serous Fluid 36 (L) 50 - 90 %   Other Cells, Fluid CORRELATE WITH CYTOLOGY. %  Albumin, pleural or peritoneal fluid      Status: None   Collection Time: 01/25/22 10:33 AM  Result Value Ref Range   Albumin, Fluid <1.5 g/dL   Fluid Type-FALB CYTO PERI   Protein, pleural or peritoneal fluid     Status: None   Collection Time: 01/25/22 10:33 AM  Result Value Ref Range   Total protein, fluid <3.0 g/dL   Fluid Type-FTP CYTO PERI   Comprehensive metabolic panel     Status: Abnormal   Collection Time: 01/25/22 11:22 AM  Result Value Ref Range   Sodium 125 (L) 135 - 145 mmol/L   Potassium 5.1 3.5 - 5.1 mmol/L   Chloride 99 98 - 111 mmol/L   CO2 16 (L) 22 - 32 mmol/L   Glucose, Bld 57 (L) 70 - 99 mg/dL   BUN 75 (H) 6 - 20 mg/dL   Creatinine, Ser 3.64 (H) 0.61 - 1.24 mg/dL   Calcium 7.1 (L) 8.9 - 10.3 mg/dL   Total Protein 4.9 (L) 6.5 - 8.1 g/dL   Albumin 2.8 (L) 3.5 - 5.0 g/dL   AST 202 (H) 15 - 41 U/L   ALT 151 (H) 0 - 44 U/L   Alkaline Phosphatase 78 38 - 126 U/L   Total Bilirubin 30.9 (HH) 0.3 - 1.2 mg/dL   GFR, Estimated 18 (L) >60 mL/min   Anion gap 10 5 - 15  Glucose, capillary     Status: Abnormal   Collection Time: 01/25/22 11:22 AM  Result Value Ref Range   Glucose-Capillary 51 (L) 70 - 99 mg/dL  Glucose, capillary     Status: None   Collection Time: 01/25/22 11:53 AM  Result Value Ref Range   Glucose-Capillary 95 70 - 99  mg/dL  Ammonia     Status: Abnormal   Collection Time: 01/25/22 12:08 PM  Result Value Ref Range   Ammonia 55 (H) 9 - 35 umol/L    I have Reviewed nursing notes, Vitals, and Lab results since pt's last encounter. Pertinent lab results : see above I have ordered test including BMP, CBC, Mg I have reviewed the last note from staff over past 24 hours I have discussed pt's care plan and test results with nursing staff, case manager   LOS: 1 day   Dwyane Dee, MD [facility] 01/25/2022, 2:09 PM

## 2022-01-25 DEATH — deceased

## 2022-01-26 ENCOUNTER — Encounter (HOSPITAL_COMMUNITY): Payer: Self-pay | Admitting: Nephrology

## 2022-01-26 DIAGNOSIS — Z7189 Other specified counseling: Secondary | ICD-10-CM | POA: Diagnosis not present

## 2022-01-26 DIAGNOSIS — K922 Gastrointestinal hemorrhage, unspecified: Secondary | ICD-10-CM | POA: Diagnosis not present

## 2022-01-26 DIAGNOSIS — N179 Acute kidney failure, unspecified: Secondary | ICD-10-CM | POA: Diagnosis not present

## 2022-01-26 DIAGNOSIS — K7290 Hepatic failure, unspecified without coma: Secondary | ICD-10-CM | POA: Diagnosis not present

## 2022-01-26 LAB — COMPREHENSIVE METABOLIC PANEL
ALT: 130 U/L — ABNORMAL HIGH (ref 0–44)
ALT: 132 U/L — ABNORMAL HIGH (ref 0–44)
ALT: 117 U/L — ABNORMAL HIGH (ref 0–44)
AST: 168 U/L — ABNORMAL HIGH (ref 15–41)
AST: 169 U/L — ABNORMAL HIGH (ref 15–41)
AST: 144 U/L — ABNORMAL HIGH (ref 15–41)
Albumin: 2.6 g/dL — ABNORMAL LOW (ref 3.5–5.0)
Albumin: 2.5 g/dL — ABNORMAL LOW (ref 3.5–5.0)
Albumin: 3.8 g/dL (ref 3.5–5.0)
Alkaline Phosphatase: 68 U/L (ref 38–126)
Alkaline Phosphatase: 73 U/L (ref 38–126)
Alkaline Phosphatase: 67 U/L (ref 38–126)
Anion gap: 9 (ref 5–15)
Anion gap: 9 (ref 5–15)
Anion gap: 9 (ref 5–15)
BUN: 85 mg/dL — ABNORMAL HIGH (ref 6–20)
BUN: 88 mg/dL — ABNORMAL HIGH (ref 6–20)
BUN: 90 mg/dL — ABNORMAL HIGH (ref 6–20)
CO2: 16 mmol/L — ABNORMAL LOW (ref 22–32)
CO2: 17 mmol/L — ABNORMAL LOW (ref 22–32)
CO2: 20 mmol/L — ABNORMAL LOW (ref 22–32)
Calcium: 6.9 mg/dL — ABNORMAL LOW (ref 8.9–10.3)
Calcium: 6.9 mg/dL — ABNORMAL LOW (ref 8.9–10.3)
Calcium: 7.2 mg/dL — ABNORMAL LOW (ref 8.9–10.3)
Chloride: 100 mmol/L (ref 98–111)
Chloride: 100 mmol/L (ref 98–111)
Chloride: 98 mmol/L (ref 98–111)
Creatinine, Ser: 4.24 mg/dL — ABNORMAL HIGH (ref 0.61–1.24)
Creatinine, Ser: 4.39 mg/dL — ABNORMAL HIGH (ref 0.61–1.24)
Creatinine, Ser: 4.4 mg/dL — ABNORMAL HIGH (ref 0.61–1.24)
GFR, Estimated: 15 mL/min — ABNORMAL LOW (ref >60)
GFR, Estimated: 15 mL/min — ABNORMAL LOW (ref >60)
GFR, Estimated: 15 mL/min — ABNORMAL LOW (ref >60)
Glucose, Bld: 187 mg/dL — ABNORMAL HIGH (ref 70–99)
Glucose, Bld: 198 mg/dL — ABNORMAL HIGH (ref 70–99)
Glucose, Bld: 197 mg/dL — ABNORMAL HIGH (ref 70–99)
Potassium: 5.3 mmol/L — ABNORMAL HIGH (ref 3.5–5.1)
Potassium: 5.3 mmol/L — ABNORMAL HIGH (ref 3.5–5.1)
Potassium: 4.8 mmol/L (ref 3.5–5.1)
Sodium: 125 mmol/L — ABNORMAL LOW (ref 135–145)
Sodium: 126 mmol/L — ABNORMAL LOW (ref 135–145)
Sodium: 127 mmol/L — ABNORMAL LOW (ref 135–145)
Total Bilirubin: 31.4 mg/dL (ref 0.3–1.2)
Total Bilirubin: 30.9 mg/dL (ref 0.3–1.2)
Total Bilirubin: 32.5 mg/dL (ref 0.3–1.2)
Total Protein: 4.4 g/dL — ABNORMAL LOW (ref 6.5–8.1)
Total Protein: 4.4 g/dL — ABNORMAL LOW (ref 6.5–8.1)
Total Protein: 5.6 g/dL — ABNORMAL LOW (ref 6.5–8.1)

## 2022-01-26 LAB — URINALYSIS, ROUTINE W REFLEX MICROSCOPIC
Bilirubin Urine: NEGATIVE
Glucose, UA: 50 mg/dL — AB
Ketones, ur: NEGATIVE mg/dL
Nitrite: NEGATIVE
Protein, ur: 100 mg/dL — AB
RBC / HPF: >50 RBC/hpf — ABNORMAL HIGH (ref 0–5)
Specific Gravity, Urine: 1.017 (ref 1.005–1.030)
pH: 5 (ref 5.0–8.0)

## 2022-01-26 LAB — CBC WITH DIFFERENTIAL/PLATELET
Abs Immature Granulocytes: 0.08 10*3/uL — ABNORMAL HIGH (ref 0.00–0.07)
Basophils Absolute: 0 10*3/uL (ref 0.0–0.1)
Basophils Relative: 0 %
Eosinophils Absolute: 0.1 10*3/uL (ref 0.0–0.5)
Eosinophils Relative: 2 %
HCT: 25.6 % — ABNORMAL LOW (ref 39.0–52.0)
Hemoglobin: 8.8 g/dL — ABNORMAL LOW (ref 13.0–17.0)
Immature Granulocytes: 1 %
Lymphocytes Relative: 4 %
Lymphs Abs: 0.4 10*3/uL — ABNORMAL LOW (ref 0.7–4.0)
MCH: 33.8 pg (ref 26.0–34.0)
MCHC: 34.4 g/dL (ref 30.0–36.0)
MCV: 98.5 fL (ref 80.0–100.0)
Monocytes Absolute: 1 10*3/uL (ref 0.1–1.0)
Monocytes Relative: 11 %
Neutro Abs: 7.5 10*3/uL (ref 1.7–7.7)
Neutrophils Relative %: 82 %
Platelets: 30 10*3/uL — ABNORMAL LOW (ref 150–400)
RBC: 2.6 MIL/uL — ABNORMAL LOW (ref 4.22–5.81)
RDW: 17.2 % — ABNORMAL HIGH (ref 11.5–15.5)
WBC: 9.1 10*3/uL (ref 4.0–10.5)
nRBC: 0 % (ref 0.0–0.2)

## 2022-01-26 LAB — URINE CULTURE: Culture: NO GROWTH

## 2022-01-26 LAB — GLUCOSE, CAPILLARY
Glucose-Capillary: 156 mg/dL — ABNORMAL HIGH (ref 70–99)
Glucose-Capillary: 160 mg/dL — ABNORMAL HIGH (ref 70–99)
Glucose-Capillary: 161 mg/dL — ABNORMAL HIGH (ref 70–99)
Glucose-Capillary: 174 mg/dL — ABNORMAL HIGH (ref 70–99)
Glucose-Capillary: 185 mg/dL — ABNORMAL HIGH (ref 70–99)
Glucose-Capillary: 186 mg/dL — ABNORMAL HIGH (ref 70–99)
Glucose-Capillary: 199 mg/dL — ABNORMAL HIGH (ref 70–99)

## 2022-01-26 LAB — MAGNESIUM: Magnesium: 2.8 mg/dL — ABNORMAL HIGH (ref 1.7–2.4)

## 2022-01-26 LAB — PHOSPHORUS: Phosphorus: 7.7 mg/dL — ABNORMAL HIGH (ref 2.5–4.6)

## 2022-01-26 MED ORDER — ALBUMIN HUMAN 25 % IV SOLN
90.0000 g | Freq: Every day | INTRAVENOUS | Status: DC
  Filled 2022-01-26: qty 350

## 2022-01-26 MED ORDER — MIDODRINE HCL 5 MG PO TABS
10.0000 mg | ORAL_TABLET | Freq: Three times a day (TID) | ORAL | Status: DC
  Administered 2022-01-26 – 2022-01-28 (×4): 10 mg via ORAL
  Filled 2022-01-26 (×5): qty 2

## 2022-01-26 MED ORDER — SODIUM BICARBONATE 8.4 % IV SOLN
INTRAVENOUS | Status: DC
  Filled 2022-01-26: qty 150

## 2022-01-26 MED ORDER — HYDROMORPHONE HCL 1 MG/ML IJ SOLN
0.5000 mg | INTRAMUSCULAR | Status: DC | PRN
  Administered 2022-01-26 (×2): 0.5 mg via INTRAVENOUS
  Filled 2022-01-26 (×2): qty 1

## 2022-01-26 MED ORDER — LACTULOSE 10 GM/15ML PO SOLN
30.0000 g | Freq: Three times a day (TID) | ORAL | Status: DC
  Administered 2022-01-26 – 2022-01-29 (×8): 30 g via ORAL
  Filled 2022-01-26 (×8): qty 45

## 2022-01-26 NOTE — Progress Notes (Signed)
Progress Note    OCTAVIA BRONKEMA   [id number]  DOB: 05-18-1960  DOA: 01/01/2022     2 PCP: Janith Lima, MD  Initial CC: [facility] Course: Mr. Garbo is a 61 yo male with PMH alcoholic cirrhosis with ascites, hepatic encephalopathy, DMII, HLD, HTN.  He was recently hospitalized from 01/09/2022 until 01/21/2022 for upper GI bleed. He underwent EGD during hospitalization which showed small varices, erosive esophagitis, and portal gastropathy. Per patient and family he has reportedly been abstinent from alcohol since original hospitalization starting on 01/09/2022. After returning home from the hospital, he developed rectal bleeding associated with bright red blood and was brought back to the hospital. He has a lingering confusion. He had ongoing elevated LFTs including uptrending total bili.  Renal function also showed worsening with associated hyperkalemia. He underwent CTA GI bleed work-up which was negative for active hemorrhage.  Multiple esophageal, paraesophageal, and perigastric varices noted. He was admitted for GI evaluation and further work-up.  Interval History:  Patient awake this morning and still oriented.  He does still show capacity and still is hoping for treatment and some recovery.  He still says yes when asked if he would want dialysis if offered. Small bloody stool noted in bedpan this morning. Discussed with family in the waiting room and updated them regarding worsened renal function and nephrology to see patient today.  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  Transfusing as necessary  Decompensated hepatic cirrhosis ([name]) - NH3 55, uptrending LFTs and INR - MELD-Na 40 pts with 71.3% mortality. [place] DF 111 pts (also poor prognosis) - start albumin 1g/kg x 2 days and monitor response - steroid on hold in setting of GIB - overall poor prognosis and have relayed this to patient and family; he wishes to remain full code at this time -Continue lactulose and rifaximin -GI following closely as well.  Not a transplant candidate due to recent alcohol use history and now probable MSOF  SIRS (systemic inflammatory response syndrome) ([name]) - lower suspicion for infection given negative paracentesis for SBP; no other sources at this time. - suspect elevated lactic acid from liver disease and hypoperfusion possibly even - suspect leukocytosis from recent steroid use; only 1% bands on diff from 9/30; will add on diff to 10/1 specimen too - d/c abx and monitor off  Hyponatremia - suspected from decompensated cirrhosis - continue trending  Hyperkalemia - presumed due to AKI - okay for lokelma use; discussed with GI - K responded to lokelma and insulin - continue trending  AKI (acute kidney injury) ([place]) - baseline creatinine ~ 1 - patient presents with increase in creat >0.3 mg/dL above baseline, creat increase >1.5x baseline presumed to have occurred within past 7 days PTA - differential includes prerenal from hypoperfusion and GI losses vs contrast injury vs HRS vs combo -Renal ultrasound negative for hydronephrosis.  Chronic medical renal disease appreciated.  Abnormal wall thickening along dome of bladder measuring 1.1 cm (may need eventual cystoscopy to exclude mass) - urine studies consistent with prerenal given FeNa<1% although IVF causing worsening abd distension despite high  dose albumin as well - IVF discontinued per nephrology - continue IV albumin for now  - if worsening creatinine,  further plan TBD per nephrology (CRRT vs tx to [facility])  Goals of care, counseling/discussion - patient still with decompensated cirrhosis with some worsening clinically since discharge and ongoing GIB concern - extremely high MELD-Na and MDF scores consistent with very poor prognosis - family and patient aware of poor prognosis; patient does wish for full code still but family would help make decisions if he were to decompensate - continue monitoring clinical course   Thrombocytopenia ([place]) - PLTC down to 30k on 10/1 - given 1 unit platelets early morning of 10/2; still low this am - continue monitoring for further signs bleeding/bloody stools and will transfuse if necessary   Alcohol abuse - patient reports abstinence since last admission - cirrhosis etiology due to chronic alcohol abuse   Metabolic acidosis - multifactorial in setting of GI losses, AKI, and decompensated cirrhosis with elevated lactic   Controlled type 2 diabetes mellitus without complication, without long-term current use of insulin ([place]) - Currently hypoglycemic -Continue treating as necessary -Last A1c 5.9% on 01/21/2022  Essential hypertension - BP remaining stable for now - Hold off on any antihypertensives  Hyperlipidemia LDL goal <70 - statin contraindicated  OSA on CPAP - offer CPAP nightly    Old records reviewed in assessment of this patient  Antimicrobials:   DVT prophylaxis:  SCDs Start: 01/19/2022 1220   Code Status:   Code Status: Full Code  Mobility Assessment (last 72 hours)     Mobility Assessment   No documentation.           Barriers to discharge: none Disposition Plan:  Pending clinical course  Status is: Inpt  Objective: Blood pressure (!) 142/51, pulse 70, temperature 97.9 F (36.6 C), temperature source Oral, resp. rate 17, height 5\' 7"  (1.702 m), weight 88.5 kg, SpO2 96 %.  Examination:  Physical Exam Constitutional:      Comments: Ill-appearing adult gentleman  lying in bed in no distress with some confusion appreciated.  He is grossly jaundiced  HENT:     Head: Normocephalic and atraumatic.     Mouth/Throat:     Mouth: Mucous membranes are moist.  Eyes:     General: Scleral icterus present.     Extraocular Movements: Extraocular movements intact.  Cardiovascular:     Rate and Rhythm: Normal rate and regular rhythm.  Pulmonary:     Effort: Pulmonary effort is normal.     Breath sounds: Normal breath sounds.  Abdominal:     General: Bowel sounds are normal.     Palpations: Abdomen is soft.     Comments: Abdomen further distended.  No overt tenderness to palpation  Musculoskeletal:        General: Normal range of motion.     Cervical back: Normal range of motion and neck supple.     Comments: Trace LE edema  Skin:    General: Skin is warm and dry.     Coloration: Skin is jaundiced.  Neurological:     Comments: Oriented to name.  Unable to state the year, president, or where he is.  Tremors appreciated in upper extremity but no true asterixis      Consultants:  GI Nephrology   Procedures:    Data Reviewed: Results for orders placed or performed during the hospital encounter of 12/28/2021 (from the past 24 hour(s))  Glucose, capillary     Status: Abnormal  Collection Time: 01/25/22  3:56 PM  Result Value Ref Range   Glucose-Capillary <10 (LL) 70 - 99 mg/dL   Comment 1 Document in Chart   Glucose, capillary     Status: Abnormal   Collection Time: 01/25/22  4:17 PM  Result Value Ref Range   Glucose-Capillary 116 (H) 70 - 99 mg/dL  Glucose, capillary     Status: Abnormal   Collection Time: 01/25/22  4:41 PM  Result Value Ref Range   Glucose-Capillary 100 (H) 70 - 99 mg/dL  Glucose, capillary     Status: Abnormal   Collection Time: 01/25/22  6:22 PM  Result Value Ref Range   Glucose-Capillary 108 (H) 70 - 99 mg/dL  Comprehensive metabolic panel     Status: Abnormal   Collection Time: 01/25/22  6:45 PM  Result Value Ref Range    Sodium 127 (L) 135 - 145 mmol/L   Potassium 5.9 (H) 3.5 - 5.1 mmol/L   Chloride 101 98 - 111 mmol/L   CO2 16 (L) 22 - 32 mmol/L   Glucose, Bld 103 (H) 70 - 99 mg/dL   BUN 82 (H) 6 - 20 mg/dL   Creatinine, Ser 3.81 (H) 0.61 - 1.24 mg/dL   Calcium 7.1 (L) 8.9 - 10.3 mg/dL   Total Protein 4.8 (L) 6.5 - 8.1 g/dL   Albumin 2.8 (L) 3.5 - 5.0 g/dL   AST 188 (H) 15 - 41 U/L   ALT 133 (H) 0 - 44 U/L   Alkaline Phosphatase 66 38 - 126 U/L   Total Bilirubin 30.6 (HH) 0.3 - 1.2 mg/dL   GFR, Estimated 17 (L) >60 mL/min   Anion gap 10 5 - 15  CBC with Differential/Platelet     Status: Abnormal   Collection Time: 01/25/22  6:45 PM  Result Value Ref Range   WBC 11.7 (H) 4.0 - 10.5 K/uL   RBC 2.73 (L) 4.22 - 5.81 MIL/uL   Hemoglobin 9.2 (L) 13.0 - 17.0 g/dL   HCT 26.6 (L) 39.0 - 52.0 %   MCV 97.4 80.0 - 100.0 fL   MCH 33.7 26.0 - 34.0 pg   MCHC 34.6 30.0 - 36.0 g/dL   RDW 17.2 (H) 11.5 - 15.5 %   Platelets 30 (L) 150 - 400 K/uL   nRBC 0.0 0.0 - 0.2 %   Neutrophils Relative % 80 %   Neutro Abs 9.3 (H) 1.7 - 7.7 K/uL   Lymphocytes Relative 4 %   Lymphs Abs 0.5 (L) 0.7 - 4.0 K/uL   Monocytes Relative 13 %   Monocytes Absolute 1.5 (H) 0.1 - 1.0 K/uL   Eosinophils Relative 1 %   Eosinophils Absolute 0.1 0.0 - 0.5 K/uL   Basophils Relative 0 %   Basophils Absolute 0.0 0.0 - 0.1 K/uL   Immature Granulocytes 2 %   Abs Immature Granulocytes 0.28 (H) 0.00 - 0.07 K/uL  Glucose, capillary     Status: Abnormal   Collection Time: 01/25/22  8:05 PM  Result Value Ref Range   Glucose-Capillary 117 (H) 70 - 99 mg/dL  Prepare platelet pheresis     Status: None (Preliminary result)   Collection Time: 01/25/22  9:28 PM  Result Value Ref Range   Unit Number [id number]    Blood Component Type PLTP1 PSORALEN TREATED    Unit division 00    Status of Unit ISSUED    Transfusion Status OK TO TRANSFUSE   Glucose, capillary     Status: Abnormal   Collection Time:  01/25/22 10:08 PM  Result Value Ref  Range   Glucose-Capillary 173 (H) 70 - 99 mg/dL  Glucose, capillary     Status: Abnormal   Collection Time: 01/26/22 12:03 AM  Result Value Ref Range   Glucose-Capillary 161 (H) 70 - 99 mg/dL  Comprehensive metabolic panel     Status: Abnormal   Collection Time: 01/26/22 12:36 AM  Result Value Ref Range   Sodium 125 (L) 135 - 145 mmol/L   Potassium 5.3 (H) 3.5 - 5.1 mmol/L   Chloride 100 98 - 111 mmol/L   CO2 16 (L) 22 - 32 mmol/L   Glucose, Bld 187 (H) 70 - 99 mg/dL   BUN 85 (H) 6 - 20 mg/dL   Creatinine, Ser 4.24 (H) 0.61 - 1.24 mg/dL   Calcium 6.9 (L) 8.9 - 10.3 mg/dL   Total Protein 4.4 (L) 6.5 - 8.1 g/dL   Albumin 2.6 (L) 3.5 - 5.0 g/dL   AST 168 (H) 15 - 41 U/L   ALT 130 (H) 0 - 44 U/L   Alkaline Phosphatase 68 38 - 126 U/L   Total Bilirubin 31.4 (HH) 0.3 - 1.2 mg/dL   GFR, Estimated 15 (L) >60 mL/min   Anion gap 9 5 - 15  CBC with Differential/Platelet     Status: Abnormal   Collection Time: 01/26/22 12:36 AM  Result Value Ref Range   WBC 9.1 4.0 - 10.5 K/uL   RBC 2.60 (L) 4.22 - 5.81 MIL/uL   Hemoglobin 8.8 (L) 13.0 - 17.0 g/dL   HCT 25.6 (L) 39.0 - 52.0 %   MCV 98.5 80.0 - 100.0 fL   MCH 33.8 26.0 - 34.0 pg   MCHC 34.4 30.0 - 36.0 g/dL   RDW 17.2 (H) 11.5 - 15.5 %   Platelets 30 (L) 150 - 400 K/uL   nRBC 0.0 0.0 - 0.2 %   Neutrophils Relative % 82 %   Neutro Abs 7.5 1.7 - 7.7 K/uL   Lymphocytes Relative 4 %   Lymphs Abs 0.4 (L) 0.7 - 4.0 K/uL   Monocytes Relative 11 %   Monocytes Absolute 1.0 0.1 - 1.0 K/uL   Eosinophils Relative 2 %   Eosinophils Absolute 0.1 0.0 - 0.5 K/uL   Basophils Relative 0 %   Basophils Absolute 0.0 0.0 - 0.1 K/uL   Immature Granulocytes 1 %   Abs Immature Granulocytes 0.08 (H) 0.00 - 0.07 K/uL  Magnesium     Status: Abnormal   Collection Time: 01/26/22 12:36 AM  Result Value Ref Range   Magnesium 2.8 (H) 1.7 - 2.4 mg/dL  Phosphorus     Status: Abnormal   Collection Time: 01/26/22 12:36 AM  Result Value Ref Range   Phosphorus  7.7 (H) 2.5 - 4.6 mg/dL  Glucose, capillary     Status: Abnormal   Collection Time: 01/26/22  4:38 AM  Result Value Ref Range   Glucose-Capillary 174 (H) 70 - 99 mg/dL  Comprehensive metabolic panel     Status: Abnormal   Collection Time: 01/26/22  6:17 AM  Result Value Ref Range   Sodium 126 (L) 135 - 145 mmol/L   Potassium 5.3 (H) 3.5 - 5.1 mmol/L   Chloride 100 98 - 111 mmol/L   CO2 17 (L) 22 - 32 mmol/L   Glucose, Bld 198 (H) 70 - 99 mg/dL   BUN 88 (H) 6 - 20 mg/dL   Creatinine, Ser 4.39 (H) 0.61 - 1.24 mg/dL   Calcium 6.9 (L) 8.9 -  10.3 mg/dL   Total Protein 4.4 (L) 6.5 - 8.1 g/dL   Albumin 2.5 (L) 3.5 - 5.0 g/dL   AST 169 (H) 15 - 41 U/L   ALT 132 (H) 0 - 44 U/L   Alkaline Phosphatase 73 38 - 126 U/L   Total Bilirubin 30.9 (HH) 0.3 - 1.2 mg/dL   GFR, Estimated 15 (L) >60 mL/min   Anion gap 9 5 - 15  Glucose, capillary     Status: Abnormal   Collection Time: 01/26/22  7:32 AM  Result Value Ref Range   Glucose-Capillary 186 (H) 70 - 99 mg/dL   Comment 1 Notify RN   Glucose, capillary     Status: Abnormal   Collection Time: 01/26/22 11:33 AM  Result Value Ref Range   Glucose-Capillary 185 (H) 70 - 99 mg/dL   Comment 1 Notify RN   Comprehensive metabolic panel     Status: Abnormal   Collection Time: 01/26/22 12:04 PM  Result Value Ref Range   Sodium 127 (L) 135 - 145 mmol/L   Potassium 4.8 3.5 - 5.1 mmol/L   Chloride 98 98 - 111 mmol/L   CO2 20 (L) 22 - 32 mmol/L   Glucose, Bld 197 (H) 70 - 99 mg/dL   BUN 90 (H) 6 - 20 mg/dL   Creatinine, Ser 4.40 (H) 0.61 - 1.24 mg/dL   Calcium 7.2 (L) 8.9 - 10.3 mg/dL   Total Protein 5.6 (L) 6.5 - 8.1 g/dL   Albumin 3.8 3.5 - 5.0 g/dL   AST 144 (H) 15 - 41 U/L   ALT 117 (H) 0 - 44 U/L   Alkaline Phosphatase 67 38 - 126 U/L   Total Bilirubin 32.5 (HH) 0.3 - 1.2 mg/dL   GFR, Estimated 15 (L) >60 mL/min   Anion gap 9 5 - 15  Urinalysis, Routine w reflex microscopic Urine, Clean Catch     Status: Abnormal   Collection Time:  01/26/22  1:42 PM  Result Value Ref Range   Color, Urine AMBER (A) YELLOW   APPearance HAZY (A) CLEAR   Specific Gravity, Urine 1.017 1.005 - 1.030   pH 5.0 5.0 - 8.0   Glucose, UA 50 (A) NEGATIVE mg/dL   Hgb urine dipstick LARGE (A) NEGATIVE   Bilirubin Urine NEGATIVE NEGATIVE   Ketones, ur NEGATIVE NEGATIVE mg/dL   Protein, ur 100 (A) NEGATIVE mg/dL   Nitrite NEGATIVE NEGATIVE   Leukocytes,Ua TRACE (A) NEGATIVE   RBC / HPF >50 (H) 0 - 5 RBC/hpf   WBC, UA 11-20 0 - 5 WBC/hpf   Bacteria, UA RARE (A) NONE SEEN   Squamous Epithelial / LPF 0-5 0 - 5   Mucus PRESENT    Budding Yeast PRESENT     I have Reviewed nursing notes, Vitals, and Lab results since pt's last encounter. Pertinent lab results : see above I have ordered test including BMP, CBC, Mg I have reviewed the last note from staff over past 24 hours I have discussed pt's care plan and test results with nursing staff, case manager   LOS: 2 days   Dwyane Dee, MD [facility] 01/26/2022, 3:25 PM

## 2022-01-26 NOTE — Consult Note (Addendum)
Renal Service Consult Note [facility]  LAKODA MCANANY 01/26/2022 Sol Blazing, MD Requesting Physician: Dr. Sabino Gasser  Reason for Consult: Renal failure HPI: The patient is a 61 y.o. year-old w/ hx of DM2, etoh abuse, cirrhosis, CAD, HL, HTN who presented to ED per EMs on 9/30 w/ GI bleed. Has recent admit 9/15- 9/27 for the same. He presented w/ dark and red stools, jaundice, abd pain and nausea. In ED pt was stable and was started on IV PPI and octreotide, serial H&H and GI consult. He got 100 cc IV contrast w/ an CT angio GI bleed on 9/30.  BP's in ED were borderline at 97/72 and creat 1.63. BP's improved but creatinine worsening up to 3.6 yesterday and 4.3 today. We are asked to see for renal failure.   Pt did not require pressors here. On 9/30 he had CT angio GI bleed w/ 100 cc contrast. No CXR yest this admission. CXR last admit 9/18 showed mild vasc congestion w/o edema.  Pt seen in room, no c/o's.   ROS - denies CP, no joint pain, no HA, no blurry vision, no rash, no diarrhea, no nausea/ vomiting, no dysuria, no difficulty voiding   Past Medical History  Past Medical History:  Diagnosis Date   Alcohol abuse    Coronary atherosclerosis due to calcified coronary lesion    Diabetes ([place])    Dyspnea    from anemia   Hyperlipidemia    Hypertension    Past Surgical History  Past Surgical History:  Procedure Laterality Date   ESOPHAGOGASTRODUODENOSCOPY N/A 01/14/2022   Procedure: ESOPHAGOGASTRODUODENOSCOPY (EGD);  Surgeon: Arta Silence, MD;  Location: [facility];  Service: Gastroenterology;  Laterality: N/A;   NO PAST SURGERIES     Family History  Family History  Problem Relation Age of Onset   Diabetes Mother    Hypertension Mother    Diabetes Father    Hypertension Father    Heart attack Father    Prostate cancer Father    Diabetes Sister    Healthy Daughter    Healthy Son    Diabetes Paternal Grandmother    Diabetes Paternal Grandfather     Cancer Paternal Grandfather        unknown    Social History  reports that he has never smoked. His smokeless tobacco use includes snuff. He reports current alcohol use of about 24.0 standard drinks of alcohol per week. He reports that he does not currently use drugs. Allergies  Allergies  Allergen Reactions   Penicillins Hives    Did it involve swelling of the face/tongue/throat, SOB, or low BP? No Did it involve sudden or severe rash/hives, skin peeling, or any reaction on the inside of your mouth or nose? Yes Did you need to seek medical attention at a hospital or doctor's office? Unknown When did it last happen?   childhood    If all above answers are "NO", may proceed with cephalosporin use.     Home medications Prior to Admission medications   Medication Sig Start Date End Date Taking? Authorizing Provider  carvedilol (COREG) 3.125 MG tablet Take 1 tablet (3.125 mg total) by mouth 2 (two) times daily with a meal. 01/21/22 04/21/22 Yes [place] Donnamarie Poag, DO  folic acid (FOLVITE) 1 MG tablet Take 1 tablet (1 mg total) by mouth daily. 01/22/22 04/22/22 Yes [place] Eric J, DO  insulin detemir (LEVEMIR) 100 UNIT/ML FlexPen Inject 20 Units into the skin daily. 01/21/22 04/21/22 Yes [place] Donnamarie Poag,  DO  Multiple Vitamin (MULTIVITAMIN WITH MINERALS) TABS tablet Take 1 tablet by mouth daily. 01/22/22  Yes [place] Eric J, DO  pantoprazole (PROTONIX) 40 MG tablet Take 1 tablet (40 mg total) by mouth 2 (two) times daily. 01/21/22 04/21/22 Yes [place] Eric J, DO  predniSONE (DELTASONE) 20 MG tablet Take 2 tablets (40 mg total) by mouth daily. 01/23/22 02/22/22 Yes [place] Eric J, DO  rifaximin (XIFAXAN) 550 MG TABS tablet Take 1 tablet (550 mg total) by mouth 2 (two) times daily. 01/21/22 04/21/22 Yes [place] Eric J, DO  blood glucose meter kit and supplies Dispense based on patient and insurance preference. Use up to four times daily as directed. (FOR ICD-10 E10.9, E11.9). 01/21/22   [place] Donnamarie Poag,  DO  Insulin Pen Needle (PEN NEEDLES 3/16") 31G X 5 MM MISC Use as directed with insulin pen 01/21/22   [place] Donnamarie Poag, DO  thiamine ([employer] B-1) 100 MG tablet Take 1 tablet (100 mg total) by mouth daily. Patient not taking: Reported on 01/19/2022 01/21/22 04/21/22  [place] Eric J, [place]     Vitals:   01/26/22 0915 01/26/22 0930 01/26/22 1000 01/26/22 1030  BP:  (!) 130/51 (!) 125/52 (!) 141/52  Pulse: 67 71 74 70  Resp: (!) 22 (!) 25 (!) 30 20  Temp:      TempSrc:      SpO2: 96% 94% 94% 96%  Weight:      Height:       Exam Gen alert, no distress, nasal O2 No rash, cyanosis or gangrene Sclera anicteric, throat clear  No jvd or bruits Chest clear bilat to bases, no rales/ wheezing RRR no MRG Abd soft ntnd no mass or ascites +bs GU normal male MS no joint effusions or deformity Ext 1+ bilat pretib edema, no UE edema Neuro is groggy, arouses and Ox 3    Home meds include - carvedilol 3.125 bid, insulin detemir, pantoprazole, prednisone, rifaximin 550 bid, thiamine, prns/ vits/ supps       Date  Creat   eGFR     2020- 21  0.78- 0.94       2021  0.82     Mar- may 2022 0.77- 0.97     Oct 2022  0.63     9/15- 01/21/22 1.94 >> 1.04 AKI episode,   39 -  > 60 ml/min     9/30   1.63, 2.88     10/1   3.07, 3.64      10/2  4.24, 4.39               I/O here = 6.0 L in and 225 cc UOP out = 5.7 L+      BPs 90s- 110 1st day here, last 2 days 115- 135/ 50- 70      Did not require pressors this admission      Alb 1.9 admit >> 2.5 today      Na 126  K+ 5.3  CO2 17  BUN 88  Creat 4.39  Ca 6.9  mg 2.8  phos 7.7        AST 169, AlT 132  tbili 30  eGFR 15 today        wBC 9k  Hb 8.8  plt 30k       UA pending        UNa 11,  UCr 63         CT angio abd/ GI bleed 9/30 > Adrenals/Urinary Tract: The adrenal  glands are within normal limits, the kidneys enhance symmetrically. Multiple scattered hypodensities are present in the kidneys, possible cysts. No renal calculus or hydronephrosis.                Assessment/ Plan: AKI - b/l creatinine 1.0 from 01/21/22, eGFR > 60 ml/min.  Creat here was 1.6 on admission, now up to 3 yesterday and 4.3 today. UA pending, UNa low, UCr not really high. BP's soft initially but now normal for last 48hrs. Did receive IV contrast on 9/30 for CT angio GIB. Poor UOP, +5.7L here. +LE edema. Suspect this is ATN from contrast. Can't r/o some degree of underlying HRS as well. Will cont IV albumin, octreotide and add midodrine 10 tid for goal MAP > 84. If creat continues to worsen, will need tx to [facility] for temp HD vs CRRT here. Not a good candidate for long-term dialysis.  Cirrhosis/ etoh abuse - decompensated cirrhosis w/ etoh hepatitis and MELD >40. Not liver transplant candidate. Hepatic encephalopathy - per GI/ pmd Volume - looks a bit vol overloaded, don't think further fluids will help. Will dc IVF's.       Rob Amauris Debois  MD 01/26/2022, 11:37 AM Recent Labs  Lab 01/14/2022 2056 01/25/22 0039 01/25/22 1845 01/26/22 0036 01/26/22 0617  HGB 10.9*   < > 9.2* 8.8*  --   ALBUMIN 1.7*   < > 2.8* 2.6* 2.5*  CALCIUM 6.9*   < > 7.1* 6.9* 6.9*  PHOS 8.4*  --   --  7.7*  --   CREATININE 2.88*   < > 3.81* 4.24* 4.39*  K 5.9*   < > 5.9* 5.3* 5.3*   < > = values in this interval not displayed.   Inpatient medications:  Chlorhexidine Gluconate Cloth  6 each Topical Daily   insulin aspart  0-5 Units Subcutaneous QHS   insulin aspart  0-9 Units Subcutaneous TID WC   lactulose  20 g Oral TID   rifaximin  550 mg Oral BID    octreotide (SANDOSTATIN) 500 mcg in sodium chloride 0.9 % 250 mL (2 mcg/mL) infusion 50 mcg/hr (01/26/22 0834)   pantoprazole 8 mg/hr (01/26/22 0835)   sodium bicarbonate 150 mEq in dextrose 5 % 1,150 mL infusion 100 mL/hr at 01/26/22 0850   mouth rinse

## 2022-01-26 NOTE — Progress Notes (Signed)
[facility] Gastroenterology Progress Note  Leroy Bell 61 y.o. [birth year]   Subjective: Patient seen and examined lying in bed, family at bedside.  Patient is somnolent, not easily arousable.  Family notes he was up and talking earlier this morning.  No BMs overnight, nursing reports small chunks of bright red blood this morning.   Objective: Vital signs in last 24 hours: Vitals:   01/26/22 1000 01/26/22 1030  BP: (!) 125/52 (!) 141/52  Pulse: 74 70  Resp: (!) 30 20  Temp:    SpO2: 94% 96%    Physical Exam:  General:  Somnolent, appears stated age, jaundiced  Head:  Normocephalic, without obvious abnormality, atraumatic  Lungs:   Clear to auscultation bilaterally, respirations unlabored  Heart:  Regular rate and rhythm, S1, S2 normal  Abdomen:   Soft, non-tender, bowel sounds active all four quadrants,  no masses,   Extremities: Extremities normal, atraumatic, no  edema  Pulses: 2+ and symmetric    Lab Results: Recent Labs    2022-02-16 2056 01/25/22 0039 01/26/22 0036 01/26/22 0617  NA 125*   < > 125* 126*  K 5.9*   < > 5.3* 5.3*  CL 99   < > 100 100  CO2 17*   < > 16* 17*  GLUCOSE 174*   < > 187* 198*  BUN 71*   < > 85* 88*  CREATININE 2.88*   < > 4.24* 4.39*  CALCIUM 6.9*   < > 6.9* 6.9*  MG  --   --  2.8*  --   PHOS 8.4*  --  7.7*  --    < > = values in this interval not displayed.   Recent Labs    01/26/22 0036 01/26/22 0617  AST 168* 169*  ALT 130* 132*  ALKPHOS 68 73  BILITOT 31.4* 30.9*  PROT 4.4* 4.4*  ALBUMIN 2.6* 2.5*   Recent Labs    01/25/22 1845 01/26/22 0036  WBC 11.7* 9.1  NEUTROABS 9.3* 7.5  HGB 9.2* 8.8*  HCT 26.6* 25.6*  MCV 97.4 98.5  PLT 30* 30*   Recent Labs    2022-02-16 0411 01/25/22 0039  LABPROT 25.1* 29.9*  INR 2.3* 2.9*      Assessment Decompensated alcoholic cirrhosis Alcoholic hepatitis Hepatic encephalopathy Elevated INR Hyperbilirubinemia Thrombocytopenia  HGB 8.8(9.2) Platelets 30 AST 169 ALT 132   Alkphos 73 TBili 30.9(31.4) GFR 15  INR 01/25/2022 2.9  MELD 3.0: 40 at 01/26/2022  6:17 AM MELD-Na: 43 at 01/26/2022  6:17 AM Calculated from: Serum Creatinine: 4.39 mg/dL (Using max of 3 mg/dL) at 16/12/6787  3:81 AM Serum Sodium: 126 mmol/L at 01/26/2022  6:17 AM Total Bilirubin: 30.9 mg/dL at 05/03/5100  5:85 AM Serum Albumin: 2.5 g/dL at 27/10/8240  3:53 AM INR(ratio): 2.9 at 01/25/2022 12:39 AM Age at listing (hypothetical): 56 years Sex: Male at 01/26/2022  6:17 AM DF: 113.2  Maddrey's discriminant function elevated, steroids on hold for possible GI bleed.  We will reconsider starting steroids.  Bilirubin improving slightly overnight.  Patient has history of esophageal varices, hepatic encephalopathy and ascites.  Paracentesis 10/1: 5.1 L removed, no signs of SBP  Thrombocytopenia, elevated INR, likely due to liver cirrhosis.  Patient significantly somnolent this morning, no bowel movements overnight, currently on lactulose 20 g 3 times daily and Xifaxan 550 mg twice daily.   Dr. Lorenso Quarry discussed patient case with Dr. Titus Dubin of [facility] hepatology transplant center, patient is not a candidate for current liver transplantation given altered mental status  and recent alcohol use.  Recommend following up with the hematology outpatient to discuss options and candidacy.  Hematochezia  Patient reports more checks of bright red blood, no large-volume bloody stools, hemoglobin stable overnight.  Do not suspect brisk GI bleed.  Plan: Continue Xifaxan 550 mg twice daily, continue lactulose 20 g 3 times daily, consider increasing to 30 mg 3 times daily if not achieving 2-3 BM daily or if worsening encephalopathy Continue pantoprazole 80 mg drip Continue octreotide 50 mcg/hr Poor prognosis [facility] will follow  Charlott Rakes PA-C 01/26/2022, 11:26 AM  Contact #  [phone]

## 2022-01-27 ENCOUNTER — Inpatient Hospital Stay (HOSPITAL_COMMUNITY): Payer: Managed Care, Other (non HMO)

## 2022-01-27 ENCOUNTER — Inpatient Hospital Stay: Payer: No Typology Code available for payment source | Admitting: Internal Medicine

## 2022-01-27 DIAGNOSIS — R578 Other shock: Secondary | ICD-10-CM | POA: Diagnosis not present

## 2022-01-27 DIAGNOSIS — K922 Gastrointestinal hemorrhage, unspecified: Secondary | ICD-10-CM | POA: Diagnosis not present

## 2022-01-27 DIAGNOSIS — N179 Acute kidney failure, unspecified: Secondary | ICD-10-CM | POA: Diagnosis not present

## 2022-01-27 DIAGNOSIS — Z7189 Other specified counseling: Secondary | ICD-10-CM | POA: Diagnosis not present

## 2022-01-27 DIAGNOSIS — K7290 Hepatic failure, unspecified without coma: Secondary | ICD-10-CM | POA: Diagnosis not present

## 2022-01-27 DIAGNOSIS — K625 Hemorrhage of anus and rectum: Secondary | ICD-10-CM | POA: Diagnosis not present

## 2022-01-27 LAB — CBC WITH DIFFERENTIAL/PLATELET
Abs Immature Granulocytes: 0.07 10*3/uL (ref 0.00–0.07)
Basophils Absolute: 0 10*3/uL (ref 0.0–0.1)
Basophils Relative: 0 %
Eosinophils Absolute: 0.2 10*3/uL (ref 0.0–0.5)
Eosinophils Relative: 3 %
HCT: 23.8 % — ABNORMAL LOW (ref 39.0–52.0)
Hemoglobin: 7.9 g/dL — ABNORMAL LOW (ref 13.0–17.0)
Immature Granulocytes: 1 %
Lymphocytes Relative: 4 %
Lymphs Abs: 0.3 10*3/uL — ABNORMAL LOW (ref 0.7–4.0)
MCH: 33.1 pg (ref 26.0–34.0)
MCHC: 33.2 g/dL (ref 30.0–36.0)
MCV: 99.6 fL (ref 80.0–100.0)
Monocytes Absolute: 0.9 10*3/uL (ref 0.1–1.0)
Monocytes Relative: 12 %
Neutro Abs: 6.1 10*3/uL (ref 1.7–7.7)
Neutrophils Relative %: 80 %
Platelets: 32 10*3/uL — ABNORMAL LOW (ref 150–400)
RBC: 2.39 MIL/uL — ABNORMAL LOW (ref 4.22–5.81)
RDW: 17.8 % — ABNORMAL HIGH (ref 11.5–15.5)
WBC: 7.6 10*3/uL (ref 4.0–10.5)
nRBC: 0 % (ref 0.0–0.2)

## 2022-01-27 LAB — PROTIME-INR
INR: 3.7 — ABNORMAL HIGH (ref 0.8–1.2)
Prothrombin Time: 36.3 seconds — ABNORMAL HIGH (ref 11.4–15.2)

## 2022-01-27 LAB — RENAL FUNCTION PANEL
Albumin: 3.2 g/dL — ABNORMAL LOW (ref 3.5–5.0)
Anion gap: 8 (ref 5–15)
BUN: 77 mg/dL — ABNORMAL HIGH (ref 6–20)
CO2: 21 mmol/L — ABNORMAL LOW (ref 22–32)
Calcium: 6.9 mg/dL — ABNORMAL LOW (ref 8.9–10.3)
Chloride: 102 mmol/L (ref 98–111)
Creatinine, Ser: 4.68 mg/dL — ABNORMAL HIGH (ref 0.61–1.24)
GFR, Estimated: 14 mL/min — ABNORMAL LOW (ref >60)
Glucose, Bld: 152 mg/dL — ABNORMAL HIGH (ref 70–99)
Phosphorus: 6 mg/dL — ABNORMAL HIGH (ref 2.5–4.6)
Potassium: 4.7 mmol/L (ref 3.5–5.1)
Sodium: 131 mmol/L — ABNORMAL LOW (ref 135–145)

## 2022-01-27 LAB — BPAM PLATELET PHERESIS
Blood Product Expiration Date: 202310032359
ISSUE DATE / TIME: 202310020113
Unit Type and Rh: 5100

## 2022-01-27 LAB — COMPREHENSIVE METABOLIC PANEL
ALT: 115 U/L — ABNORMAL HIGH (ref 0–44)
ALT: 112 U/L — ABNORMAL HIGH (ref 0–44)
AST: 137 U/L — ABNORMAL HIGH (ref 15–41)
AST: 144 U/L — ABNORMAL HIGH (ref 15–41)
Albumin: 3.4 g/dL — ABNORMAL LOW (ref 3.5–5.0)
Albumin: 3.4 g/dL — ABNORMAL LOW (ref 3.5–5.0)
Alkaline Phosphatase: 74 U/L (ref 38–126)
Alkaline Phosphatase: 70 U/L (ref 38–126)
Anion gap: 10 (ref 5–15)
Anion gap: 11 (ref 5–15)
BUN: 96 mg/dL — ABNORMAL HIGH (ref 6–20)
BUN: 95 mg/dL — ABNORMAL HIGH (ref 6–20)
CO2: 18 mmol/L — ABNORMAL LOW (ref 22–32)
CO2: 18 mmol/L — ABNORMAL LOW (ref 22–32)
Calcium: 7.2 mg/dL — ABNORMAL LOW (ref 8.9–10.3)
Calcium: 7.3 mg/dL — ABNORMAL LOW (ref 8.9–10.3)
Chloride: 100 mmol/L (ref 98–111)
Chloride: 100 mmol/L (ref 98–111)
Creatinine, Ser: 5.23 mg/dL — ABNORMAL HIGH (ref 0.61–1.24)
Creatinine, Ser: 5.84 mg/dL — ABNORMAL HIGH (ref 0.61–1.24)
GFR, Estimated: 12 mL/min — ABNORMAL LOW (ref >60)
GFR, Estimated: 10 mL/min — ABNORMAL LOW (ref >60)
Glucose, Bld: 176 mg/dL — ABNORMAL HIGH (ref 70–99)
Glucose, Bld: 170 mg/dL — ABNORMAL HIGH (ref 70–99)
Potassium: 5.5 mmol/L — ABNORMAL HIGH (ref 3.5–5.1)
Potassium: 5.4 mmol/L — ABNORMAL HIGH (ref 3.5–5.1)
Sodium: 128 mmol/L — ABNORMAL LOW (ref 135–145)
Sodium: 129 mmol/L — ABNORMAL LOW (ref 135–145)
Total Bilirubin: 33.1 mg/dL (ref 0.3–1.2)
Total Bilirubin: 32.9 mg/dL (ref 0.3–1.2)
Total Protein: 5.1 g/dL — ABNORMAL LOW (ref 6.5–8.1)
Total Protein: 5 g/dL — ABNORMAL LOW (ref 6.5–8.1)

## 2022-01-27 LAB — PREPARE PLATELET PHERESIS: Unit division: 0

## 2022-01-27 LAB — GLUCOSE, CAPILLARY
Glucose-Capillary: 144 mg/dL — ABNORMAL HIGH (ref 70–99)
Glucose-Capillary: 206 mg/dL — ABNORMAL HIGH (ref 70–99)
Glucose-Capillary: 203 mg/dL — ABNORMAL HIGH (ref 70–99)
Glucose-Capillary: 163 mg/dL — ABNORMAL HIGH (ref 70–99)
Glucose-Capillary: 137 mg/dL — ABNORMAL HIGH (ref 70–99)

## 2022-01-27 LAB — OSMOLALITY, URINE: Osmolality, Ur: 324 mOsm/kg (ref 300–900)

## 2022-01-27 LAB — PREPARE RBC (CROSSMATCH)

## 2022-01-27 LAB — HEMOGLOBIN AND HEMATOCRIT, BLOOD
HCT: 18.3 % — ABNORMAL LOW (ref 39.0–52.0)
Hemoglobin: 6.4 g/dL — CL (ref 13.0–17.0)

## 2022-01-27 LAB — FIBRINOGEN: Fibrinogen: 83 mg/dL — CL (ref 210–475)

## 2022-01-27 LAB — CYTOLOGY - NON PAP

## 2022-01-27 LAB — PHOSPHORUS: Phosphorus: 8.2 mg/dL — ABNORMAL HIGH (ref 2.5–4.6)

## 2022-01-27 LAB — MAGNESIUM: Magnesium: 3.1 mg/dL — ABNORMAL HIGH (ref 1.7–2.4)

## 2022-01-27 MED ORDER — PANTOPRAZOLE SODIUM 40 MG IV SOLR
40.0000 mg | Freq: Two times a day (BID) | INTRAVENOUS | Status: DC
  Administered 2022-01-27 – 2022-01-29 (×5): 40 mg via INTRAVENOUS
  Filled 2022-01-27 (×5): qty 10

## 2022-01-27 MED ORDER — SODIUM CHLORIDE 0.9% IV SOLUTION: Freq: Once | INTRAVENOUS | Status: AC

## 2022-01-27 MED ORDER — SODIUM ZIRCONIUM CYCLOSILICATE 10 G PO PACK
10.0000 g | PACK | Freq: Four times a day (QID) | ORAL | Status: AC
  Administered 2022-01-27: 10 g via ORAL
  Filled 2022-01-27: qty 1

## 2022-01-27 MED ORDER — NOREPINEPHRINE 4 MG/250ML-% IV SOLN
2.0000 ug/min | INTRAVENOUS | Status: DC
  Administered 2022-01-27: 2 ug/min via INTRAVENOUS

## 2022-01-27 MED ORDER — VITAMIN K1 10 MG/ML IJ SOLN
5.0000 mg | Freq: Once | INTRAVENOUS | Status: AC
  Administered 2022-01-27: 5 mg via INTRAVENOUS
  Filled 2022-01-27: qty 0.5

## 2022-01-27 MED ORDER — ALBUMIN HUMAN 25 % IV SOLN
87.5000 g | INTRAVENOUS | Status: AC
  Administered 2022-01-27 – 2022-01-28 (×2): 87.5 g via INTRAVENOUS
  Filled 2022-01-27 (×2): qty 350

## 2022-01-27 MED ORDER — NOREPINEPHRINE 4 MG/250ML-% IV SOLN
0.0000 ug/min | INTRAVENOUS | Status: DC
  Administered 2022-01-27: 4 ug/min via INTRAVENOUS

## 2022-01-27 MED ORDER — INSULIN ASPART 100 UNIT/ML IJ SOLN: 10.0000 [IU] | Freq: Once | INTRAMUSCULAR | Status: DC

## 2022-01-27 MED ORDER — DEXTROSE 50 % IV SOLN
25.0000 g | Freq: Once | INTRAVENOUS | Status: AC
  Administered 2022-01-27: 25 g via INTRAVENOUS
  Filled 2022-01-27: qty 50

## 2022-01-27 MED ORDER — HEPARIN SODIUM (PORCINE) 1000 UNIT/ML IJ SOLN: 1000.0000 [IU] | INTRAMUSCULAR | Status: DC | PRN

## 2022-01-27 MED ORDER — PRISMASOL BGK 4/2.5 32-4-2.5 MEQ/L REPLACEMENT SOLN: Status: DC

## 2022-01-27 MED ORDER — SODIUM CHLORIDE 0.9 % FOR CRRT: INTRAVENOUS_CENTRAL | Status: DC | PRN

## 2022-01-27 MED ORDER — PRISMASOL BGK 4/2.5 32-4-2.5 MEQ/L EC SOLN: Status: DC

## 2022-01-27 MED ORDER — ALTEPLASE 2 MG IJ SOLR: 2.0000 mg | Freq: Once | INTRAMUSCULAR | Status: DC | PRN

## 2022-01-27 MED ORDER — SODIUM CHLORIDE 0.9 % IV SOLN
20.0000 ug | Freq: Once | INTRAVENOUS | Status: AC
  Administered 2022-01-27: 20 ug via INTRAVENOUS
  Filled 2022-01-27: qty 5

## 2022-01-27 MED ORDER — DEXTROSE 50 % IV SOLN: 25.0000 g | Freq: Once | INTRAVENOUS | Status: DC

## 2022-01-27 MED ORDER — NOREPINEPHRINE 4 MG/250ML-% IV SOLN
INTRAVENOUS | Status: AC
  Filled 2022-01-27: qty 250

## 2022-01-27 MED ORDER — INSULIN ASPART 100 UNIT/ML IJ SOLN
10.0000 [IU] | Freq: Once | INTRAMUSCULAR | Status: AC
  Administered 2022-01-27: 10 [IU] via INTRAVENOUS

## 2022-01-27 MED ORDER — IOHEXOL 350 MG/ML SOLN
100.0000 mL | Freq: Once | INTRAVENOUS | Status: AC | PRN
  Administered 2022-01-27: 100 mL via INTRAVENOUS

## 2022-01-27 MED ORDER — VITAMIN K1 10 MG/ML IJ SOLN
5.0000 mg | Freq: Once | INTRAVENOUS | Status: DC
  Filled 2022-01-27: qty 0.5

## 2022-01-27 NOTE — Progress Notes (Signed)
Progress Note    Leroy Bell   VQM:[id number]  DOB: [birth year]  DOA: 12/28/2021     3 PCP: Janith Lima, MD  Initial CC: [facility] Course: Leroy Bell is a 61 yo male with PMH alcoholic cirrhosis with ascites, hepatic encephalopathy, DMII, HLD, HTN.  He was recently hospitalized from 01/09/2022 until 01/21/2022 for upper GI bleed. He underwent EGD during hospitalization which showed small varices, erosive esophagitis, and portal gastropathy. Per patient and family he has reportedly been abstinent from alcohol since original hospitalization starting on 01/09/2022. After returning home from the hospital, he developed rectal bleeding associated with bright red blood and was brought back to the hospital. He has a lingering confusion. He had ongoing elevated LFTs including uptrending total bili.  Renal function also showed worsening with associated hyperkalemia. He underwent CTA GI bleed work-up which was negative for active hemorrhage.  Multiple esophageal, paraesophageal, and perigastric varices noted. He was admitted for GI evaluation and further work-up.  Interval History:  Developed rectal bleeding late in the morning; CTA shows probable rectal varices bleeding. Multiple discussions held bedside with patient and family. Unfortunately patient still wishes for full code at this time.  We are supporting with multiple blood products and continuing discussions.  Extremely poor prognosis and patient not expected to survive next 24-48 hours at this rate.   Assessment and Plan: * GI bleeding - Prior hospitalization was considered from upper GIB (found to have grade 1 varices unable to be banded, grade D esophagitis, moderate portal hypertensive gastropathy) -Now presenting with bright red blood per rectum on admission - may be spontaneous from poor synthetic function in setting of liver failure vs further secondary varices/recannualization in lower GI tract -CT angio negative for  active bleeding on 9/30 - RECURRENT rectal bleeding on 10/3; CTA shows rectal varices bleeding; discussed with IR and GI; unfortunately not a candidate for treatment - supporting with blood products as able (FFP, Platelets, PRBC) - starting pressors on 10/3 - VERY poor prognosis; imminent death expected; very clear [place] discussions held; patient very adamant to remain full code unfortunately; family aware and plan to de-escalate care once patient no longer able to make decisions   Decompensated hepatic cirrhosis ([place]) - NH3 55, uptrending LFTs and INR - MELD-Na 40 pts with 71.3% mortality. Maddrey DF 111 pts (also poor prognosis) - start albumin 1g/kg x 2 days and monitor response - steroid on hold in setting of GIB - overall poor prognosis and have relayed this to patient and family; he wishes to remain full code at this time -Continue lactulose and rifaximin -GI following closely as well.  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 - suspect elevated lactic acid from liver disease and hypoperfusion possibly even - suspect leukocytosis from recent steroid use; only 1% bands on diff from 9/30; will add on diff to 10/1 specimen too - d/c abx and monitor off  Hyponatremia - suspected from decompensated cirrhosis - continue trending  Hyperkalemia - presumed due to AKI - okay for lokelma use; discussed with GI - K responded to lokelma and insulin - continue trending  AKI (acute kidney injury) ([place]) - baseline creatinine ~  1 - patient presents with increase in creat >0.3 mg/dL above baseline, creat increase >1.5x baseline presumed to have  occurred within past 7 days PTA - differential includes prerenal from hypoperfusion and GI losses vs contrast injury vs HRS vs combo -Renal ultrasound negative for hydronephrosis.  Chronic medical renal disease appreciated.  Abnormal wall thickening along dome of bladder measuring 1.1 cm (may need eventual cystoscopy to exclude mass) - urine studies consistent with prerenal given FeNa<1% although IVF causing worsening abd distension despite high dose albumin as well - IVF discontinued per nephrology - continue IV albumin for now  -Creatinine continuing to uptrend -Tentative plan is for CRRT if patient survives  Goals of care, counseling/discussion - patient still with decompensated cirrhosis with some worsening clinically since discharge and ongoing GIB concern - extremely high MELD-Na and MDF scores consistent with very poor prognosis - family and patient aware of poor prognosis; patient does wish for full code still but family would help make decisions if he were to decompensate -Unfortunately, patient continues to decline, rather rapidly today with profuse rectal bleeding and ensuing hypotension.  Unfortunately he also continues to endorse wanting to remain full code.  Family present and planning for de-escalating care once patient no longer able to make decisions  Thrombocytopenia ([place]) - PLTC down to 30k on 10/1 - given 1 unit platelets early morning of 10/2; still low this am - developed more bleeding 10/3: more blood products ordered  Alcohol abuse - patient reports abstinence since last admission - cirrhosis etiology due to chronic alcohol abuse   Metabolic acidosis - multifactorial in setting of GI losses, AKI, and decompensated cirrhosis with elevated lactic   Controlled type 2 diabetes mellitus without complication, without long-term current use of insulin ([name]) - Currently hypoglycemic -Continue treating as necessary -Last A1c 5.9% on 01/21/2022  Essential hypertension -  BP remaining stable for now - Hold off on any antihypertensives  Hyperlipidemia LDL goal <70 - statin contraindicated  OSA on CPAP - offer CPAP nightly    Old records reviewed in assessment of this patient  Antimicrobials:   DVT prophylaxis:  SCDs Start: 01/04/2022 1220   Code Status:   Code Status: Full Code  Mobility Assessment (last 72 hours)     Mobility Assessment   No documentation.           Barriers to discharge: none Disposition Plan:  Pending clinical course  Status is: Inpt  Objective: Blood pressure (!) 87/30, pulse 75, temperature 97.8 F (36.6 C), temperature source Axillary, resp. rate (!) 21, height 5\' 7"  (1.702 m), weight 88.5 kg, SpO2 100 %.  Examination:  Physical Exam Constitutional:      Comments: Ill-appearing adult gentleman lying in bed in no distress with some confusion appreciated.  He is grossly jaundiced  HENT:     Head: Normocephalic and atraumatic.     Mouth/Throat:     Mouth: Mucous membranes are moist.  Eyes:     General: Scleral icterus present.     Extraocular Movements: Extraocular movements intact.  Cardiovascular:     Rate and Rhythm: Normal rate and regular rhythm.  Pulmonary:     Effort: Pulmonary effort is normal.     Breath sounds: Normal breath sounds.  Abdominal:     General: Bowel sounds are normal.     Palpations: Abdomen is soft.     Comments: Abdomen further distended.  No overt tenderness to palpation  Musculoskeletal:        General: Normal range  of motion.     Cervical back: Normal range of motion and neck supple.     Comments: Trace LE edema  Skin:    General: Skin is warm and dry.     Coloration: Skin is jaundiced.  Neurological:     Comments: Oriented to name.  Unable to state the year, president, or where he is.  Tremors appreciated in upper extremity but no true asterixis      Consultants:  GI Nephrology  PCCM  Procedures:    Data Reviewed: Results for orders placed or performed during  the hospital encounter of 01/23/2022 (from the past 24 hour(s))  Urinalysis, Routine w reflex microscopic Urine, Clean Catch     Status: Abnormal   Collection Time: 01/26/22  1:42 PM  Result Value Ref Range   Color, Urine AMBER (A) YELLOW   APPearance HAZY (A) CLEAR   Specific Gravity, Urine 1.017 1.005 - 1.030   pH 5.0 5.0 - 8.0   Glucose, UA 50 (A) NEGATIVE mg/dL   Hgb urine dipstick LARGE (A) NEGATIVE   Bilirubin Urine NEGATIVE NEGATIVE   Ketones, ur NEGATIVE NEGATIVE mg/dL   Protein, ur 100 (A) NEGATIVE mg/dL   Nitrite NEGATIVE NEGATIVE   Leukocytes,Ua TRACE (A) NEGATIVE   RBC / HPF >50 (H) 0 - 5 RBC/hpf   WBC, UA 11-20 0 - 5 WBC/hpf   Bacteria, UA RARE (A) NONE SEEN   Squamous Epithelial / LPF 0-5 0 - 5   Mucus PRESENT    Budding Yeast PRESENT   Glucose, capillary     Status: Abnormal   Collection Time: 01/26/22  3:56 PM  Result Value Ref Range   Glucose-Capillary 199 (H) 70 - 99 mg/dL   Comment 1 Notify RN   Comprehensive metabolic panel     Status: Abnormal   Collection Time: 01/26/22  6:04 PM  Result Value Ref Range   Sodium 128 (L) 135 - 145 mmol/L   Potassium 5.2 (H) 3.5 - 5.1 mmol/L   Chloride 100 98 - 111 mmol/L   CO2 19 (L) 22 - 32 mmol/L   Glucose, Bld 192 (H) 70 - 99 mg/dL   BUN 94 (H) 6 - 20 mg/dL   Creatinine, Ser 4.76 (H) 0.61 - 1.24 mg/dL   Calcium 7.1 (L) 8.9 - 10.3 mg/dL   Total Protein 5.0 (L) 6.5 - 8.1 g/dL   Albumin 3.5 3.5 - 5.0 g/dL   AST 137 (H) 15 - 41 U/L   ALT 111 (H) 0 - 44 U/L   Alkaline Phosphatase 62 38 - 126 U/L   Total Bilirubin 30.7 (HH) 0.3 - 1.2 mg/dL   GFR, Estimated 13 (L) >60 mL/min   Anion gap 9 5 - 15  Glucose, capillary     Status: Abnormal   Collection Time: 01/26/22  7:37 PM  Result Value Ref Range   Glucose-Capillary 160 (H) 70 - 99 mg/dL   Comment 1 Notify RN    Comment 2 Document in Chart   Glucose, capillary     Status: Abnormal   Collection Time: 01/26/22 11:09 PM  Result Value Ref Range   Glucose-Capillary 156  (H) 70 - 99 mg/dL   Comment 1 Notify RN    Comment 2 Document in Chart   Comprehensive metabolic panel     Status: Abnormal   Collection Time: 01/27/22 12:24 AM  Result Value Ref Range   Sodium 128 (L) 135 - 145 mmol/L   Potassium 5.5 (H) 3.5 - 5.1 mmol/L  Chloride 100 98 - 111 mmol/L   CO2 18 (L) 22 - 32 mmol/L   Glucose, Bld 176 (H) 70 - 99 mg/dL   BUN 96 (H) 6 - 20 mg/dL   Creatinine, Ser 5.23 (H) 0.61 - 1.24 mg/dL   Calcium 7.2 (L) 8.9 - 10.3 mg/dL   Total Protein 5.1 (L) 6.5 - 8.1 g/dL   Albumin 3.4 (L) 3.5 - 5.0 g/dL   AST 137 (H) 15 - 41 U/L   ALT 115 (H) 0 - 44 U/L   Alkaline Phosphatase 74 38 - 126 U/L   Total Bilirubin 33.1 (HH) 0.3 - 1.2 mg/dL   GFR, Estimated 12 (L) >60 mL/min   Anion gap 10 5 - 15  CBC with Differential/Platelet     Status: Abnormal   Collection Time: 01/27/22 12:24 AM  Result Value Ref Range   WBC 7.6 4.0 - 10.5 K/uL   RBC 2.39 (L) 4.22 - 5.81 MIL/uL   Hemoglobin 7.9 (L) 13.0 - 17.0 g/dL   HCT 23.8 (L) 39.0 - 52.0 %   MCV 99.6 80.0 - 100.0 fL   MCH 33.1 26.0 - 34.0 pg   MCHC 33.2 30.0 - 36.0 g/dL   RDW 17.8 (H) 11.5 - 15.5 %   Platelets 32 (L) 150 - 400 K/uL   nRBC 0.0 0.0 - 0.2 %   Neutrophils Relative % 80 %   Neutro Abs 6.1 1.7 - 7.7 K/uL   Lymphocytes Relative 4 %   Lymphs Abs 0.3 (L) 0.7 - 4.0 K/uL   Monocytes Relative 12 %   Monocytes Absolute 0.9 0.1 - 1.0 K/uL   Eosinophils Relative 3 %   Eosinophils Absolute 0.2 0.0 - 0.5 K/uL   Basophils Relative 0 %   Basophils Absolute 0.0 0.0 - 0.1 K/uL   Immature Granulocytes 1 %   Abs Immature Granulocytes 0.07 0.00 - 0.07 K/uL  Magnesium     Status: Abnormal   Collection Time: 01/27/22 12:24 AM  Result Value Ref Range   Magnesium 3.1 (H) 1.7 - 2.4 mg/dL  Phosphorus     Status: Abnormal   Collection Time: 01/27/22 12:24 AM  Result Value Ref Range   Phosphorus 8.2 (H) 2.5 - 4.6 mg/dL  Protime-INR     Status: Abnormal   Collection Time: 01/27/22 12:24 AM  Result Value Ref Range    Prothrombin Time 36.3 (H) 11.4 - 15.2 seconds   INR 3.7 (H) 0.8 - 1.2  Glucose, capillary     Status: Abnormal   Collection Time: 01/27/22  4:59 AM  Result Value Ref Range   Glucose-Capillary 144 (H) 70 - 99 mg/dL   Comment 1 Notify RN    Comment 2 Document in Chart   Comprehensive metabolic panel     Status: Abnormal   Collection Time: 01/27/22  6:21 AM  Result Value Ref Range   Sodium 129 (L) 135 - 145 mmol/L   Potassium 5.4 (H) 3.5 - 5.1 mmol/L   Chloride 100 98 - 111 mmol/L   CO2 18 (L) 22 - 32 mmol/L   Glucose, Bld 170 (H) 70 - 99 mg/dL   BUN 95 (H) 6 - 20 mg/dL   Creatinine, Ser 5.84 (H) 0.61 - 1.24 mg/dL   Calcium 7.3 (L) 8.9 - 10.3 mg/dL   Total Protein 5.0 (L) 6.5 - 8.1 g/dL   Albumin 3.4 (L) 3.5 - 5.0 g/dL   AST 144 (H) 15 - 41 U/L   ALT 112 (H) 0 - 44  U/L   Alkaline Phosphatase 70 38 - 126 U/L   Total Bilirubin 32.9 (HH) 0.3 - 1.2 mg/dL   GFR, Estimated 10 (L) >60 mL/min   Anion gap 11 5 - 15  Glucose, capillary     Status: Abnormal   Collection Time: 01/27/22  8:47 AM  Result Value Ref Range   Glucose-Capillary 206 (H) 70 - 99 mg/dL  Prepare platelet pheresis     Status: None (Preliminary result)   Collection Time: 01/27/22 10:36 AM  Result Value Ref Range   Unit Number [id number]    Blood Component Type PLTP2 PSORALEN TREATED    Unit division 00    Status of Unit ISSUED    Transfusion Status      OK TO TRANSFUSE Performed at [facility] [address], [place], [place] [address]   Prepare fresh frozen plasma     Status: None (Preliminary result)   Collection Time: 01/27/22 11:35 AM  Result Value Ref Range   Unit Number [id number]    Blood Component Type THW PLS APHR    Unit division 00    Status of Unit ALLOCATED    Transfusion Status OK TO TRANSFUSE    Unit Number [id number]    Blood Component Type THW PLS APHR    Unit division A0    Status of Unit ISSUED    Transfusion Status      OK TO TRANSFUSE Performed at [facility] [address], [place], [place] [address]   Prepare RBC (crossmatch)     Status: None   Collection Time: 01/27/22 11:43 AM  Result Value Ref Range   Order Confirmation      ORDER PROCESSED BY BLOOD BANK Performed at [facility], [place] [address], [place], [place] [address]   Glucose, capillary     Status: Abnormal   Collection Time: 01/27/22 12:45 PM  Result Value Ref Range   Glucose-Capillary 203 (H) 70 - 99 mg/dL   Comment 1 Notify RN    Comment 2 Document in Chart     I have Reviewed nursing notes, Vitals, and Lab results since pt's last encounter. Pertinent lab results : see above I have ordered test including BMP, CBC, Mg I have reviewed the last note from staff over past 24 hours I have discussed pt's care plan and test results with nursing staff, case manager   LOS: 3 days   Dwyane Dee, MD [facility] 01/27/2022, 1:38 PM

## 2022-01-27 NOTE — Procedures (Addendum)
Central Venous Catheter Insertion Procedure Note  Leroy Bell  [id number]  [birth year]  Date:01/27/22  Time:2:35 PM   Provider Performing:Holly Pring Viona Gilmore [place]   Procedure: Insertion of Non-tunneled Central Venous Catheter(36556)with US guidance (40981)    Indication(s) Medication administration and Hemodialysis  Consent Risks of the procedure as well as the alternatives and risks of each were explained to the patient and/or caregiver.  Consent for the procedure was obtained and is signed in the bedside chart  Anesthesia Topical only with 1% lidocaine   Timeout Verified patient identification, verified procedure, site/side was marked, verified correct patient position, special equipment/implants available, medications/allergies/relevant history reviewed, required imaging and test results available.  Sterile Technique Maximal sterile technique including full sterile barrier drape, hand hygiene, sterile gown, sterile gloves, mask, hair covering, sterile ultrasound probe cover (if used).  Procedure Description Area of catheter insertion was cleaned with chlorhexidine and draped in sterile fashion.   With real-time ultrasound guidance a HD catheter was placed into the right internal jugular vein.  Nonpulsatile blood flow and easy flushing noted in all ports.  The catheter was sutured in place and sterile dressing applied.  Complications/Tolerance None; patient tolerated the procedure well. Chest X-ray is ordered to verify placement for internal jugular or subclavian cannulation.  Chest x-ray is not ordered for femoral cannulation.  EBL Minimal  Specimen(s) None       Georgann Housekeeper, AGACNP-BC [facility]  See Amion for personal pager PCCM on call pager [phone] until 7pm. Please call Elink 7p-7a. [phone]  01/27/2022 2:35 PM

## 2022-01-27 NOTE — Progress Notes (Signed)
[facility] Gastroenterology Progress Note  Leroy Bell 61 y.o. 1960/12/23    Subjective: Examined laying in bed, family at bedside, patient awake this morning but provided minimal verbal response to questions. He was scheduled to start CRRT today. family reported 1 bowel movement at noon yesterday with bright red blood and clots, per nursing report patient had large volume bright red blood per rectum at 11 AM, 1 unit of packed red blood cells, 2 units of FFP, 5 mg of vitamin K ordered, stat CT angio GI bleed ordered as well.      Objective: Vital signs in last 24 hours: Vitals:   01/27/22 1107 01/27/22 1147  BP: (!) 152/41 (!) 124/33  Pulse: 78 79  Resp: 20 (!) 27  Temp: 99 F (37.2 C) 98.8 F (37.1 C)  SpO2: 98% 97%    Physical Exam:  General:  Alert, cooperative, no distress, appears stated age, deeply icteric  Head:  Normocephalic, without obvious abnormality, atraumatic  Eyes:  icteric sclera, EOM's intact  Lungs:   Clear to auscultation bilaterally, respirations unlabored  Heart:  Regular rate and rhythm, S1, S2 normal  Abdomen:   Soft, non-tender, distended abdomen, bowel sounds active all four quadrants,  no masses,   Extremities: Extremities normal, atraumatic, no  edema  Pulses: 2+ and symmetric    Lab Results: Recent Labs    01/26/22 0036 01/26/22 0617 01/27/22 0024 01/27/22 0621  NA 125*   < > 128* 129*  K 5.3*   < > 5.5* 5.4*  CL 100   < > 100 100  CO2 16*   < > 18* 18*  GLUCOSE 187*   < > 176* 170*  BUN 85*   < > 96* 95*  CREATININE 4.24*   < > 5.23* 5.84*  CALCIUM 6.9*   < > 7.2* 7.3*  MG 2.8*  --  3.1*  --   PHOS 7.7*  --  8.2*  --    < > = values in this interval not displayed.   Recent Labs    01/27/22 0024 01/27/22 0621  AST 137* 144*  ALT 115* 112*  ALKPHOS 74 70  BILITOT 33.1* 32.9*  PROT 5.1* 5.0*  ALBUMIN 3.4* 3.4*   Recent Labs    01/26/22 0036 01/27/22 0024  WBC 9.1 7.6  NEUTROABS 7.5 6.1  HGB 8.8* 7.9*  HCT 25.6* 23.8*   MCV 98.5 99.6  PLT 30* 32*   Recent Labs    01/25/22 0039 01/27/22 0024  LABPROT 29.9* 36.3*  INR 2.9* 3.7*      Assessment Decompensated alcoholic cirrhosis History of esophageal varices Portal hypertensive gastropathy Hepatic encephalopathy Recurrent ascites Hepatorenal syndrome Painless hematochezia  HGB 7.9 Platelets 32 AST 144 ALT 112  Alkphos 70 TBili 32.9 GFR 10  INR 01/27/2022 3.7  MELD 3.0: 40 at 01/27/2022  6:21 AM MELD-Na: 46 at 01/27/2022  6:21 AM Calculated from: Serum Creatinine: 5.84 mg/dL (Using max of 3 mg/dL) at 75/09/4330  9:51 AM Serum Sodium: 129 mmol/L at 01/27/2022  6:21 AM Total Bilirubin: 32.9 mg/dL at 88/07/1658  6:30 AM Serum Albumin: 3.4 g/dL at 16/0/1093  2:35 AM INR(ratio): 3.7 at 01/27/2022 12:24 AM Age at listing (hypothetical): 61 years Sex: Male at 01/27/2022  6:21 AM  Hemoglobin decreased from 8.8-7.9 overnight, INR increased from 2.9-3.7 overnight.  Recent episode of significant hematochezia.  2 units of FFP, 1 unit packed red blood cells, 5 mg of vitamin K for total of 10 mg vitamin K today ordered.  Plan for stat CT angio GI bleed. T. bili worsened overnight at 32.9  Plan: Continue lactulose 30 g 3 times daily, Xifaxan 550 mg twice daily. Continue pantoprazole 40 mg twice daily Continue octreotide 50 mcg/h Continue midodrine 10 mg 3 times daily Continue supportive care measures Continue to monitor hemoglobin and transfuse if less than 7 [facility] will follow, guarded prognosis  Charlott Rakes PA-C 01/27/2022, 12:39 PM  Contact #  [phone]

## 2022-01-27 NOTE — Progress Notes (Signed)
[facility] Progress Note Patient Name: Leroy Bell DOB: 03-15-1961 MRN: [id number]   Date of Service  01/27/2022  HPI/Events of Note  Hemoglobin 6.4 gm / dl, patient with history of recent bleeding requiring transfusion of blood products, but there is no overt bleeding currently. Acute blood loss anemia.  eICU Interventions  One unit PRBC ordered.        Kerry Kass [name] 01/27/2022, 11:20 PM

## 2022-01-27 NOTE — Plan of Care (Signed)
Discussed with patient in front of immediate family plan of care for the evening, pain management, CPAP and importance of rest with some teach back displayed.  The CRRT and blood products explained to both with teach back displayed by family at this time.  Problem: Education: Goal: Knowledge of General Education information will improve Description: Including pain rating scale, medication(s)/side effects and non-pharmacologic comfort measures Outcome: Progressing   Problem: Health Behavior/Discharge Planning: Goal: Ability to manage health-related needs will improve Outcome: Progressing

## 2022-01-27 NOTE — Progress Notes (Signed)
[facility] Progress Note  Subjective: UOP 600 cc yesterday which is a big improvement. Creat up to day to 5.8.   Vitals:   01/27/22 0500 01/27/22 0505 01/27/22 0600 01/27/22 0700  BP: (!) 139/57  (!) 127/50 (!) 128/52  Pulse: 74 73 70 69  Resp: 16 16 13 12   Temp:      TempSrc:      SpO2: (!) 88% 94% 93% 90%  Weight:      Height:        Exam: Gen alert, RR up/ ^wob, groggy No jvd or bruits Chest clear bilat to bases RRR no MRG Abd firm, distended w/ 2-3+ ascites  Ext diffuse 2-3+ LE/ UE edema Neuro responds to loud voice, mostly somnolent w/ new myoclonic jerking which is mild      Home meds include - carvedilol 3.125 bid, insulin detemir, pantoprazole, prednisone, rifaximin 550 bid, thiamine, prns/ vits/ supps        Date                       Creat               eGFR     2020- 21                  0.78- 0.94                         2021                        0.82     Mar- may 2022        0.77- 0.97     Oct 2022                 0.63     9/15- 01/21/22           1.94 >> 1.04    AKI episode,   39 -  > 60 ml/min     9/30                         1.63, 2.88     10/1                         3.07, 3.64      10/2                        4.24, 4.39               I/O here = 6.0 L in and 225 cc UOP out = 5.7 L+      BPs 90s- 110 1st day here, last 2 days 115- 135/ 50- 70      Did not require pressors here      Alb 1.9 admit >> 2.5 today      Na 126  K+ 5.3  CO2 17  BUN 88  Creat 4.39  Ca 6.9  mg 2.8  phos 7.7        AST 169, AlT 132  tbili 30  eGFR 15 today        wBC 9k  Hb 8.8  plt 30k       UA prot 100, >50 rbc, 11-20wbc, 0-5 epi, rare bact         UNa 11,  UCr 63  CT angio abd/ GI bleed 9/30 > Adrenals/Urinary Tract: The adrenal glands are within normal limits, the kidneys enhance symmetrically. Multiple scattered hypodensities are present in the kidneys, possible cysts. No renal calculus or hydronephrosis.                 Assessment/ Plan: AKI - b/l  creatinine 1.0 from 01/21/22, eGFR > 60 ml/min.  Creat here was 1.6 on admission in setting of acute, recurrent GIB. UA w/ microhematuria, mild proteinuria. UNa low, UCr mid range. BP's soft initially then normal now. +IV contrast on 9/30 for CT angio GIB.  Suspect this is ATN from contrast given on day of admission but we can't r/o some degree of underlying HRS as well. We continued IV albumin and octreotide and added midodrine 10 tid yesterday. Unfortunately, creat is rising at a rapid rate. There is enough chance that this is reversible cause (I/e contrast) that I feel we should offer temporary HD cath at least. I have d/w family/ wife and we will proceed w/ RRT here, trial of 3-5 days and see if there is any improvement. 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 Will follow.  AMS - uremia and/ or hepatic encephalopathy Volume - looks real vol overloaded, dc'd IVF's, attempt UF 75- 125 cc/hr Hyperkalemia - mild, changed diet to renal / clear liquid Cirrhosis/ etoh abuse - decompensated cirrhosis w/ etoh hepatitis and MELD >40. Not liver transplant candidate. Hepatic encephalopathy - per GI/ pmd  Rob Madysin Crisp 01/27/2022, 8:25 AM   Recent Labs  Lab 01/26/22 0036 01/26/22 0617 01/27/22 0024 01/27/22 0621  HGB 8.8*  --  7.9*  --   ALBUMIN 2.6*   < > 3.4* 3.4*  CALCIUM 6.9*   < > 7.2* 7.3*  PHOS 7.7*  --  8.2*  --   CREATININE 4.24*   < > 5.23* 5.84*  K 5.3*   < > 5.5* 5.4*   < > = values in this interval not displayed.   No results for input(s): "IRON", "TIBC", "FERRITIN" in the last 168 hours. Inpatient medications:  Chlorhexidine Gluconate Cloth  6 each Topical Daily   insulin aspart  0-5 Units Subcutaneous QHS   insulin aspart  0-9 Units Subcutaneous TID WC   lactulose  30 g Oral TID   midodrine  10 mg Oral TID WC   pantoprazole (PROTONIX) IV  40 mg Intravenous Q12H   rifaximin  550 mg Oral BID   sodium zirconium cyclosilicate  10 g Oral O4J    albumin  human     octreotide (SANDOSTATIN) 500 mcg in sodium chloride 0.9 % 250 mL (2 mcg/mL) infusion 50 mcg/hr (01/27/22 0700)   phytonadione (VITAMIN K) 5 mg in dextrose 5 % 50 mL IVPB     HYDROmorphone (DILAUDID) injection, mouth rinse

## 2022-01-27 NOTE — Consult Note (Signed)
NAME:  DIVIT STIPP, MRN:  [id number], DOB:  02/05/1961, LOS: 3 ADMISSION DATE:  01/12/2022, CONSULTATION DATE:  01/27/22 REFERRING MD:  Dwyane Dee, MD CHIEF COMPLAINT:  GI Bleeding/Shock   History of Present Illness:  Leroy Bell is a 61 year old male with alcoholic cirrhosis with ascites, hepatic encephalopathy, esophageal varices, portal hypertensive gastropathy, hepatorenal syndrome and significant coagulopathy who was admitted 9/30 for GI bleeding. CTA was negative on admission for active extravasation. He was supported with multiple blood product transfusions. PCCM consulted this morning for HD catheter placement to pursue HD for hepatorenal syndrome but he started to have further GI bleeding with development of hypotension.   He is to have last drank on 01/09/22. MElD-Na 40 points, Maddrey DF 111 points.   Patient wishes to continue aggressive care measures. Family is at the bedside and understands the gravity of the situation.   Pertinent  Medical History   Past Medical History:  Diagnosis Date   Alcohol abuse    Cirrhosis ([place])    Coronary atherosclerosis due to calcified coronary lesion    Diabetes ([place])    Dyspnea    from anemia   Hyperlipidemia    Hypertension    Significant Hospital Events: Including procedures, antibiotic start and stop dates in addition to other pertinent events   9/30 admitted for lower GI bleeding 10/3 PCCM consulted for shock due to recurrent GI bleeding  Interim History / Subjective:  As above  Objective   Blood pressure (!) 95/34, pulse 73, temperature 98.8 F (37.1 C), temperature source Axillary, resp. rate 17, height 5' 7"  (1.702 m), weight 88.5 kg, SpO2 100 %.        Intake/Output Summary (Last 24 hours) at 01/27/2022 1408 Last data filed at 01/27/2022 1340 Gross per 24 hour  Intake 1596.21 ml  Output 550 ml  Net 1046.21 ml   Filed Weights   01/10/2022 1300  Weight: 88.5 kg    Examination: General: ill appearing male,  jaundiced HENT: [place]/AT, moist mucous membranes, sclera icteric Lungs: clear to auscultation, no wheezing Cardiovascular: rrr, no murmurs Abdomen: soft, distended, no tenderness Extremities: warm, trace edema Neuro: alert, oriented GU: foley in place  Resolved Hospital Problem list     Assessment & Plan:  Hemorrhagic Shock Coagulopathy Thrombocytopenia - vasopressor support for MAP 65 or greater - Aggressively transfusing PRBCs, platelets, FFP and cryo - Central line placement - Give vitamin K and DDAVP - Will consider tranexamic acid  Decompensated Alcoholic Liver Cirrhosis Hyperbilirubinemia Hepatic Encephalopathy MELD-Na 40 and Maddrey DF 111 - Continue lactulose and rifaximin - Continue octreotide - Continue albumin  - Continue lactulose and rifaximin  Hepatorenal Syndrome Acute Kidney Injury Hyponatremia Hyperkalemia Metabolic Acidosis - Nephrology is following, considering HD vs CRRT - Continue octreotide and MAP goals above - Continue albumin  DMII - SSI  OSA on CPAP - continue CPAP nightly  Best Practice (right click and "Reselect all SmartList Selections" daily)   Diet/type: NPO w/ oral meds DVT prophylaxis: not indicated GI prophylaxis: PPI Lines: Central line Foley:  Yes, and it is still needed Code Status:  full code Last date of multidisciplinary goals of care discussion [10/3 discussed with patient and family]  Labs   CBC: Recent Labs  Lab 01/06/2022 0411 01/22/2022 2056 01/25/22 0039 01/25/22 1845 01/26/22 0036 01/27/22 0024  WBC 18.2*  --  15.9* 11.7* 9.1 7.6  NEUTROABS 16.5*  --  8.2* 9.3* 7.5 6.1  HGB 10.6* 10.9* 10.8* 9.2* 8.8* 7.9*  HCT  32.3* 33.5* 31.5* 26.6* 25.6* 23.8*  MCV 103.2*  --  97.2 97.4 98.5 99.6  PLT 78*  --  49* 30* 30* 32*    Basic Metabolic Panel: Recent Labs  Lab 01/20/2022 2056 01/25/22 0039 01/26/22 0036 01/26/22 0617 01/26/22 1204 01/26/22 1804 01/27/22 0024 01/27/22 0621  NA 125*   < > 125* 126* 127*  128* 128* 129*  K 5.9*   < > 5.3* 5.3* 4.8 5.2* 5.5* 5.4*  CL 99   < > 100 100 98 100 100 100  CO2 17*   < > 16* 17* 20* 19* 18* 18*  GLUCOSE 174*   < > 187* 198* 197* 192* 176* 170*  BUN 71*   < > 85* 88* 90* 94* 96* 95*  CREATININE 2.88*   < > 4.24* 4.39* 4.40* 4.76* 5.23* 5.84*  CALCIUM 6.9*   < > 6.9* 6.9* 7.2* 7.1* 7.2* 7.3*  MG  --   --  2.8*  --   --   --  3.1*  --   PHOS 8.4*  --  7.7*  --   --   --  8.2*  --    < > = values in this interval not displayed.   GFR: Estimated Creatinine Clearance: 14.5 mL/min (A) (by C-G formula based on SCr of 5.84 mg/dL (H)). Recent Labs  Lab 01/23/2022 0430 01/13/2022 1019 01/25/22 0039 01/25/22 1845 01/26/22 0036 01/27/22 0024  PROCALCITON  --   --  0.98  --   --   --   WBC  --   --  15.9* 11.7* 9.1 7.6  LATICACIDVEN 3.3* 3.8* 2.7*  --   --   --     Liver Function Tests: Recent Labs  Lab 01/26/22 0617 01/26/22 1204 01/26/22 1804 01/27/22 0024 01/27/22 0621  AST 169* 144* 137* 137* 144*  ALT 132* 117* 111* 115* 112*  ALKPHOS 73 67 62 74 70  BILITOT 30.9* 32.5* 30.7* 33.1* 32.9*  PROT 4.4* 5.6* 5.0* 5.1* 5.0*  ALBUMIN 2.5* 3.8 3.5 3.4* 3.4*   No results for input(s): "LIPASE", "AMYLASE" in the last 168 hours. Recent Labs  Lab 12/29/2021 0430 01/25/22 1208  AMMONIA 50* 55*    ABG    Component Value Date/Time   PHART 7.417 08/02/2018 1050   PCO2ART 37.4 08/02/2018 1050   PO2ART 72.2 (L) 08/02/2018 1050   HCO3 20.0 01/07/2022 0411   ACIDBASEDEF 5.2 (H) 12/29/2021 0411   O2SAT 81.6 01/10/2022 0411     Coagulation Profile: Recent Labs  Lab 01/18/2022 0411 01/25/22 0039 01/27/22 0024  INR 2.3* 2.9* 3.7*    Cardiac Enzymes: No results for input(s): "CKTOTAL", "CKMB", "CKMBINDEX", "TROPONINI" in the last 168 hours.  HbA1C: Hgb A1c MFr Bld  Date/Time Value Ref Range Status  01/21/2022 05:45 AM 5.9 (H) 4.8 - 5.6 % Final    Comment:    (NOTE) Pre diabetes:          5.7%-6.4%  Diabetes:               >6.4%  Glycemic control for   <7.0% adults with diabetes   01/29/2021 02:06 PM 5.8 4.6 - 6.5 % Final    Comment:    Glycemic Control Guidelines for People with Diabetes:Non Diabetic:  <6%Goal of Therapy: <7%Additional Action Suggested:  >8%     CBG: Recent Labs  Lab 01/26/22 1937 01/26/22 2309 01/27/22 0459 01/27/22 0847 01/27/22 1245  GLUCAP 160* 156* 144* 206* 203*    Review of Systems:  Review of Systems  Constitutional:  Positive for malaise/fatigue. Negative for chills, fever and weight loss.  HENT:  Negative for congestion, sinus pain and sore throat.   Eyes: Negative.   Respiratory:  Negative for cough, hemoptysis, sputum production, shortness of breath and wheezing.   Cardiovascular:  Negative for chest pain, palpitations, orthopnea, claudication and leg swelling.  Gastrointestinal:  Positive for blood in stool. Negative for abdominal pain, heartburn, nausea and vomiting.  Genitourinary: Negative.   Musculoskeletal:  Negative for joint pain and myalgias.  Skin:  Negative for rash.  Neurological:  Positive for weakness.  Endo/Heme/Allergies:  Bruises/bleeds easily.  Psychiatric/Behavioral: Negative.       Past Medical History:  He,  has a past medical history of Alcohol abuse, Cirrhosis ([place]), Coronary atherosclerosis due to calcified coronary lesion, Diabetes ([place]), Dyspnea, Hyperlipidemia, and Hypertension.   Surgical History:   Past Surgical History:  Procedure Laterality Date   ESOPHAGOGASTRODUODENOSCOPY N/A 01/14/2022   Procedure: ESOPHAGOGASTRODUODENOSCOPY (EGD);  Surgeon: Arta Silence, MD;  Location: [facility];  Service: Gastroenterology;  Laterality: N/A;   NO PAST SURGERIES       Social History:   reports that he has never smoked. His smokeless tobacco use includes snuff. He reports current alcohol use of about 24.0 standard drinks of alcohol per week. He reports that he does not currently use drugs.   Family History:  His family history  includes Cancer in his paternal grandfather; Diabetes in his father, mother, paternal grandfather, paternal grandmother, and sister; Healthy in his daughter and son; Heart attack in his father; Hypertension in his father and mother; Prostate cancer in his father.   Allergies Allergies  Allergen Reactions   Penicillins Hives    Did it involve swelling of the face/tongue/throat, SOB, or low BP? No Did it involve sudden or severe rash/hives, skin peeling, or any reaction on the inside of your mouth or nose? Yes Did you need to seek medical attention at a hospital or doctor's office? Unknown When did it last happen?   childhood    If all above answers are "NO", may proceed with cephalosporin use.       Home Medications  Prior to Admission medications   Medication Sig Start Date End Date Taking? Authorizing Provider  carvedilol (COREG) 3.125 MG tablet Take 1 tablet (3.125 mg total) by mouth 2 (two) times daily with a meal. 01/21/22 04/21/22 Yes [place] Donnamarie Poag, DO  folic acid (FOLVITE) 1 MG tablet Take 1 tablet (1 mg total) by mouth daily. 01/22/22 04/22/22 Yes [place] Eric J, DO  insulin detemir (LEVEMIR) 100 UNIT/ML FlexPen Inject 20 Units into the skin daily. 01/21/22 04/21/22 Yes [place] Donnamarie Poag, DO  Multiple Vitamin (MULTIVITAMIN WITH MINERALS) TABS tablet Take 1 tablet by mouth daily. 01/22/22  Yes [place] Eric J, DO  pantoprazole (PROTONIX) 40 MG tablet Take 1 tablet (40 mg total) by mouth 2 (two) times daily. 01/21/22 04/21/22 Yes [place] Eric J, DO  predniSONE (DELTASONE) 20 MG tablet Take 2 tablets (40 mg total) by mouth daily. 01/23/22 02/22/22 Yes [place] Eric J, DO  rifaximin (XIFAXAN) 550 MG TABS tablet Take 1 tablet (550 mg total) by mouth 2 (two) times daily. 01/21/22 04/21/22 Yes [place] Eric J, DO  blood glucose meter kit and supplies Dispense based on patient and insurance preference. Use up to four times daily as directed. (FOR ICD-10 E10.9, E11.9). 01/21/22   [place] Donnamarie Poag, DO   Insulin Pen Needle (PEN NEEDLES 3/16") 31G X 5 MM MISC Use  as directed with insulin pen 01/21/22   [place] Donnamarie Poag, DO  thiamine ([employer] B-1) 100 MG tablet Take 1 tablet (100 mg total) by mouth daily. Patient not taking: Reported on 01/11/2022 01/21/22 04/21/22  [place] Eric J, DO     Critical care time: 34 minutes    Freda Jackson, MD [facility] Office: [phone]   See Amion for personal pager PCCM on call pager [phone] until 7pm. Please call Elink 7p-7a. [phone]

## 2022-01-28 ENCOUNTER — Inpatient Hospital Stay (HOSPITAL_COMMUNITY): Payer: Managed Care, Other (non HMO)

## 2022-01-28 DIAGNOSIS — K7682 Hepatic encephalopathy: Secondary | ICD-10-CM

## 2022-01-28 DIAGNOSIS — K7031 Alcoholic cirrhosis of liver with ascites: Secondary | ICD-10-CM

## 2022-01-28 DIAGNOSIS — Z7189 Other specified counseling: Secondary | ICD-10-CM | POA: Diagnosis not present

## 2022-01-28 DIAGNOSIS — N179 Acute kidney failure, unspecified: Secondary | ICD-10-CM | POA: Diagnosis not present

## 2022-01-28 LAB — BPAM FFP
Blood Product Expiration Date: 202310082359
Blood Product Expiration Date: 202310082359
ISSUE DATE / TIME: 202310031319
ISSUE DATE / TIME: 202310031743
Unit Type and Rh: 5100
Unit Type and Rh: 9500

## 2022-01-28 LAB — TYPE AND SCREEN
ABO/RH(D): O POS
Antibody Screen: NEGATIVE
Unit division: 0
Unit division: 0
Unit division: 0
Unit division: 0
Unit division: 0
Unit division: 0
Unit division: 0

## 2022-01-28 LAB — DIC (DISSEMINATED INTRAVASCULAR COAGULATION)PANEL
D-Dimer, Quant: 5.38 ug/mL-FEU — ABNORMAL HIGH (ref 0.00–0.50)
Fibrinogen: 104 mg/dL — ABNORMAL LOW (ref 210–475)
INR: 3.6 — ABNORMAL HIGH (ref 0.8–1.2)
Platelets: 24 10*3/uL — CL (ref 150–400)
Prothrombin Time: 35.4 seconds — ABNORMAL HIGH (ref 11.4–15.2)
Smear Review: NONE SEEN
aPTT: 73 seconds — ABNORMAL HIGH (ref 24–36)

## 2022-01-28 LAB — RENAL FUNCTION PANEL
Albumin: 3.1 g/dL — ABNORMAL LOW (ref 3.5–5.0)
Albumin: 4.3 g/dL (ref 3.5–5.0)
Anion gap: 11 (ref 5–15)
Anion gap: 8 (ref 5–15)
BUN: 60 mg/dL — ABNORMAL HIGH (ref 6–20)
BUN: 43 mg/dL — ABNORMAL HIGH (ref 6–20)
CO2: 21 mmol/L — ABNORMAL LOW (ref 22–32)
CO2: 25 mmol/L (ref 22–32)
Calcium: 7.1 mg/dL — ABNORMAL LOW (ref 8.9–10.3)
Calcium: 7.7 mg/dL — ABNORMAL LOW (ref 8.9–10.3)
Chloride: 102 mmol/L (ref 98–111)
Chloride: 103 mmol/L (ref 98–111)
Creatinine, Ser: 3.3 mg/dL — ABNORMAL HIGH (ref 0.61–1.24)
Creatinine, Ser: 2.15 mg/dL — ABNORMAL HIGH (ref 0.61–1.24)
GFR, Estimated: 21 mL/min — ABNORMAL LOW (ref >60)
GFR, Estimated: 35 mL/min — ABNORMAL LOW (ref >60)
Glucose, Bld: 181 mg/dL — ABNORMAL HIGH (ref 70–99)
Glucose, Bld: 176 mg/dL — ABNORMAL HIGH (ref 70–99)
Phosphorus: 4.6 mg/dL (ref 2.5–4.6)
Phosphorus: 3.8 mg/dL (ref 2.5–4.6)
Potassium: 4.6 mmol/L (ref 3.5–5.1)
Potassium: 4.3 mmol/L (ref 3.5–5.1)
Sodium: 134 mmol/L — ABNORMAL LOW (ref 135–145)
Sodium: 136 mmol/L (ref 135–145)

## 2022-01-28 LAB — BPAM RBC
Blood Product Expiration Date: 202311022359
Blood Product Expiration Date: 202311032359
Blood Product Expiration Date: 202311042359
Blood Product Expiration Date: 202311072359
Blood Product Expiration Date: 202311072359
Blood Product Expiration Date: 202311072359
Blood Product Expiration Date: 202311072359
ISSUE DATE / TIME: 202309300917
ISSUE DATE / TIME: 202309301534
ISSUE DATE / TIME: 202310031238
ISSUE DATE / TIME: 202310031453
Unit Type and Rh: 5100
Unit Type and Rh: 5100
Unit Type and Rh: 5100
Unit Type and Rh: 5100
Unit Type and Rh: 5100
Unit Type and Rh: 5100
Unit Type and Rh: 5100

## 2022-01-28 LAB — COMPREHENSIVE METABOLIC PANEL
ALT: 111 U/L — ABNORMAL HIGH (ref 0–44)
AST: 137 U/L — ABNORMAL HIGH (ref 15–41)
Albumin: 3.5 g/dL (ref 3.5–5.0)
Alkaline Phosphatase: 62 U/L (ref 38–126)
Anion gap: 9 (ref 5–15)
BUN: 94 mg/dL — ABNORMAL HIGH (ref 6–20)
CO2: 19 mmol/L — ABNORMAL LOW (ref 22–32)
Calcium: 7.1 mg/dL — ABNORMAL LOW (ref 8.9–10.3)
Chloride: 100 mmol/L (ref 98–111)
Creatinine, Ser: 4.76 mg/dL — ABNORMAL HIGH (ref 0.61–1.24)
GFR, Estimated: 13 mL/min — ABNORMAL LOW (ref >60)
Glucose, Bld: 192 mg/dL — ABNORMAL HIGH (ref 70–99)
Potassium: 5.2 mmol/L — ABNORMAL HIGH (ref 3.5–5.1)
Sodium: 128 mmol/L — ABNORMAL LOW (ref 135–145)
Total Bilirubin: 30.7 mg/dL (ref 0.3–1.2)
Total Protein: 5 g/dL — ABNORMAL LOW (ref 6.5–8.1)

## 2022-01-28 LAB — PREPARE FRESH FROZEN PLASMA: Unit division: 0

## 2022-01-28 LAB — GLUCOSE, CAPILLARY
Glucose-Capillary: 175 mg/dL — ABNORMAL HIGH (ref 70–99)
Glucose-Capillary: 163 mg/dL — ABNORMAL HIGH (ref 70–99)
Glucose-Capillary: 161 mg/dL — ABNORMAL HIGH (ref 70–99)
Glucose-Capillary: 196 mg/dL — ABNORMAL HIGH (ref 70–99)

## 2022-01-28 LAB — PREPARE CRYOPRECIPITATE: Unit division: 0

## 2022-01-28 LAB — MAGNESIUM: Magnesium: 2.7 mg/dL — ABNORMAL HIGH (ref 1.7–2.4)

## 2022-01-28 LAB — HEMOGLOBIN AND HEMATOCRIT, BLOOD
HCT: 22 % — ABNORMAL LOW (ref 39.0–52.0)
HCT: 19.8 % — ABNORMAL LOW (ref 39.0–52.0)
Hemoglobin: 7.6 g/dL — ABNORMAL LOW (ref 13.0–17.0)
Hemoglobin: 6.9 g/dL — CL (ref 13.0–17.0)

## 2022-01-28 LAB — HEPATIC FUNCTION PANEL
ALT: 87 U/L — ABNORMAL HIGH (ref 0–44)
AST: 109 U/L — ABNORMAL HIGH (ref 15–41)
Albumin: 3.5 g/dL (ref 3.5–5.0)
Alkaline Phosphatase: 66 U/L (ref 38–126)
Bilirubin, Direct: 14.9 mg/dL — ABNORMAL HIGH (ref 0.0–0.2)
Indirect Bilirubin: 12.5 mg/dL — ABNORMAL HIGH (ref 0.3–0.9)
Total Bilirubin: 27.4 mg/dL (ref 0.3–1.2)
Total Protein: 4.8 g/dL — ABNORMAL LOW (ref 6.5–8.1)

## 2022-01-28 LAB — CBC WITH DIFFERENTIAL/PLATELET
Abs Immature Granulocytes: 0.13 10*3/uL — ABNORMAL HIGH (ref 0.00–0.07)
Basophils Absolute: 0 10*3/uL (ref 0.0–0.1)
Basophils Relative: 0 %
Eosinophils Absolute: 0.2 10*3/uL (ref 0.0–0.5)
Eosinophils Relative: 3 %
HCT: 21.6 % — ABNORMAL LOW (ref 39.0–52.0)
Hemoglobin: 7.5 g/dL — ABNORMAL LOW (ref 13.0–17.0)
Immature Granulocytes: 2 %
Lymphocytes Relative: 4 %
Lymphs Abs: 0.3 10*3/uL — ABNORMAL LOW (ref 0.7–4.0)
MCH: 32.8 pg (ref 26.0–34.0)
MCHC: 34.7 g/dL (ref 30.0–36.0)
MCV: 94.3 fL (ref 80.0–100.0)
Monocytes Absolute: 1.1 10*3/uL — ABNORMAL HIGH (ref 0.1–1.0)
Monocytes Relative: 14 %
Neutro Abs: 6 10*3/uL (ref 1.7–7.7)
Neutrophils Relative %: 77 %
Platelets: 27 10*3/uL — CL (ref 150–400)
RBC: 2.29 MIL/uL — ABNORMAL LOW (ref 4.22–5.81)
RDW: 16.1 % — ABNORMAL HIGH (ref 11.5–15.5)
WBC: 7.8 10*3/uL (ref 4.0–10.5)
nRBC: 0 % (ref 0.0–0.2)

## 2022-01-28 LAB — BPAM PLATELET PHERESIS
Blood Product Expiration Date: 202310042359
ISSUE DATE / TIME: 202310031130
Unit Type and Rh: 5100

## 2022-01-28 LAB — BODY FLUID CULTURE W GRAM STAIN
Culture: NO GROWTH
Gram Stain: NONE SEEN

## 2022-01-28 LAB — PROTIME-INR
INR: 3.2 — ABNORMAL HIGH (ref 0.8–1.2)
Prothrombin Time: 32.6 seconds — ABNORMAL HIGH (ref 11.4–15.2)

## 2022-01-28 LAB — BPAM CRYOPRECIPITATE
Blood Product Expiration Date: 202310032047
ISSUE DATE / TIME: 202310031545
Unit Type and Rh: 5100

## 2022-01-28 LAB — PREPARE PLATELET PHERESIS: Unit division: 0

## 2022-01-28 LAB — PREPARE RBC (CROSSMATCH)

## 2022-01-28 MED ORDER — HYDROXYZINE HCL 25 MG PO TABS: 25.0000 mg | ORAL_TABLET | Freq: Every evening | ORAL | Status: DC | PRN

## 2022-01-28 MED ORDER — VITAMIN K1 10 MG/ML IJ SOLN
10.0000 mg | Freq: Once | INTRAVENOUS | Status: AC
  Administered 2022-01-28: 10 mg via INTRAVENOUS
  Filled 2022-01-28: qty 1

## 2022-01-28 MED ORDER — INSULIN ASPART 100 UNIT/ML IJ SOLN
0.0000 [IU] | Freq: Three times a day (TID) | INTRAMUSCULAR | Status: DC
  Administered 2022-01-28 – 2022-01-29 (×2): 2 [IU] via SUBCUTANEOUS
  Administered 2022-01-29: 1 [IU] via SUBCUTANEOUS

## 2022-01-28 MED ORDER — ALPRAZOLAM 0.5 MG PO TABS
0.5000 mg | ORAL_TABLET | Freq: Two times a day (BID) | ORAL | Status: DC | PRN
  Administered 2022-01-28: 0.5 mg via ORAL
  Filled 2022-01-28: qty 1

## 2022-01-28 MED ORDER — SODIUM CHLORIDE 0.9% IV SOLUTION: Freq: Once | INTRAVENOUS | Status: AC

## 2022-01-28 NOTE — IPAL (Signed)
  Interdisciplinary Goals of Care Family Meeting   Date carried out: 01/28/2022  Location of the meeting: Bedside  Member's involved: Nurse Practitioner and Family Member or next of kin  Durable Power of Attorney or Loss adjuster, chartered: pt     Discussion: We discussed goals of care for Leroy Bell .  Discussed current critical illness as well as chronic illnesses. Discussed code status and goals of care including current aggressive measures and full code, continuing medical care but allowing natural death in event that pts heart stops, and an intention transition toward symptom focussed care with natural death.   Pt wants to discuss further with his family.   Code status: Full Code  Disposition: Continue current acute care  Time spent for the meeting: 4min    Leroy Bell E Ilana Prezioso, NP 01/28/2022, 11:25 AM

## 2022-01-28 NOTE — Progress Notes (Addendum)
[facility] Progress Note  Subjective: UOP 250 cc yest. I/O +2.3 L on CRRT. Labs stable on CRRT.   Vitals:   01/28/22 0814 01/28/22 0900 01/28/22 1000 01/28/22 1100  BP:  (!) 184/63 (!) 154/57 (!) 174/62  Pulse:  71 64 69  Resp:  (!) 21 12 15   Temp: 98 F (36.7 C)     TempSrc: Oral     SpO2:  100% 98% 98%  Weight:      Height:        Exam: Gen chron ill appearing, no distress No jvd or bruits Chest clear bilat to bases RRR no MRG Abd firm, distended w/ 2-3+ ascites  Ext diffuse 2-3+ LE/ UE edema Foley dark urine Neuro as above      Home meds include - carvedilol 3.125 bid, insulin detemir, pantoprazole, prednisone, rifaximin 550 bid, thiamine, prns/ vits/ supps        Date                       Creat               eGFR     2020- 21                  0.78- 0.94                         2021                        0.82     Mar- may 2022        0.77- 0.97     Oct 2022                 0.63     9/15- 01/21/22           1.94 >> 1.04    AKI episode,   39 -  > 60 ml/min     9/30                         1.63, 2.88     10/1                         3.07, 3.64      10/2                        4.24, 4.39               I/O here = 6.0 L in and 225 cc UOP out = 5.7 L+      BPs 90s- 110 1st day here, last 2 days 115- 135/ 50- 70      Did not require pressors here      Alb 1.9 admit >> 2.5 today      Na 126  K+ 5.3  CO2 17  BUN 88  Creat 4.39  Ca 6.9  mg 2.8  phos 7.7        AST 169, AlT 132  tbili 30  eGFR 15 today        wBC 9k  Hb 8.8  plt 30k       UA prot 100, >50 rbc, 11-20wbc, 0-5 epi, rare bact         UNa 11,  UCr 63  CT angio abd/ GI bleed 9/30 > Adrenals/Urinary Tract: The adrenal glands are within normal limits, the kidneys enhance symmetrically. Multiple scattered hypodensities are present in the kidneys, possible cysts. No renal calculus or hydronephrosis.                 Assessment/ Plan: AKI - b/l creatinine 1.0 from 01/21/22, eGFR > 60 ml/min.   Creat here was 1.6 on admission in setting of acute, recurrent GIB. UA w/ microhematuria, mild proteinuria. UNa low, UCr mid range. BP's soft initially then normal now. +IV contrast on 9/30 for CT angio GIB.  Suspect this is ATN from contrast given on day of admission but we can't r/o some degree of underlying HRS as well. We continued IV albumin and octreotide and added midodrine 10 tid yesterday. Unfortunately, creat is rising at a rapid rate. There is enough chance that this is reversible cause (I/e contrast) that I feel we should offer temporary HD cath at least. I have d/w family/ wife and we will proceed w/ RRT here, trial of 3-5 days and see if there is any improvement. 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 Will follow.  AHRF - 6 L [place] today, pulm edema, will ^UF w/ CRRT to 125- 200cc/ hr AMS - uremia and/ or hepatic encephalopathy, interacting today Volume - significant vol overload on exam, ^UF goal as above Hyperkalemia - resolved Cirrhosis/ etoh abuse - decompensated cirrhosis w/ etoh hepatitis and MELD >40. Not liver transplant candidate. Hepatic encephalopathy - per GI/ pmd  Rob Modest Draeger 01/28/2022, 11:45 AM   Recent Labs  Lab 01/27/22 2158 01/28/22 0621  HGB 6.4* 7.5*  ALBUMIN 3.2* 3.1*  CALCIUM 6.9* 7.1*  PHOS 6.0* 4.6  CREATININE 4.68* 3.30*  K 4.7 4.6    No results for input(s): "IRON", "TIBC", "FERRITIN" in the last 168 hours. Inpatient medications:  Chlorhexidine Gluconate Cloth  6 each Topical Daily   insulin aspart  0-5 Units Subcutaneous QHS   insulin aspart  0-9 Units Subcutaneous TID WC   lactulose  30 g Oral TID   midodrine  10 mg Oral TID WC   pantoprazole (PROTONIX) IV  40 mg Intravenous Q12H   rifaximin  550 mg Oral BID     prismasol BGK 4/2.5 400 mL/hr at 01/28/22 0532    prismasol BGK 4/2.5 400 mL/hr at 01/28/22 0532   norepinephrine (LEVOPHED) Adult infusion Stopped (01/27/22 1907)   octreotide (SANDOSTATIN) 500 mcg in  sodium chloride 0.9 % 250 mL (2 mcg/mL) infusion 50 mcg/hr (01/28/22 1100)   prismasol BGK 4/2.5 1,500 mL/hr at 01/28/22 0942   alteplase, heparin sodium (porcine), HYDROmorphone (DILAUDID) injection, mouth rinse, sodium chloride

## 2022-01-28 NOTE — Progress Notes (Signed)
NAME:  Leroy Bell, MRN:  [id number], DOB:  10/05/1960, LOS: 4 ADMISSION DATE:  12-Feb-2022, CONSULTATION DATE:  01/27/22 REFERRING MD:  Lewie Chamber, MD CHIEF COMPLAINT:  GI Bleeding/Shock   History of Present Illness:  Leroy Bell is a 61 year old male with alcoholic cirrhosis with ascites, hepatic encephalopathy, esophageal varices, portal hypertensive gastropathy, hepatorenal syndrome and significant coagulopathy who was admitted 9/30 for GI bleeding. CTA was negative on admission for active extravasation. He was supported with multiple blood product transfusions. PCCM consulted this morning for HD catheter placement to pursue HD for hepatorenal syndrome but he started to have further GI bleeding with development of hypotension.   He is to have last drank on 01/09/22. MElD-Na 40 points, Maddrey DF 111 points.   Patient wishes to continue aggressive care measures. Family is at the bedside and understands the gravity of the situation.   Pertinent  Medical History   Past Medical History:  Diagnosis Date   Alcohol abuse    Cirrhosis ([name])    Coronary atherosclerosis due to calcified coronary lesion    Diabetes ([name])    Dyspnea    from anemia   Hyperlipidemia    Hypertension    Significant Hospital Events: Including procedures, antibiotic start and stop dates in addition to other pertinent events   9/30 admitted for lower GI bleeding 10/3 PCCM consulted for shock due to recurrent GI bleeding. Vit K, DDAVP, PRBC, FFP, Cryo. HD cath placed for 3-5d trial RRT. got 1 PRBC overnight 10/4 CRRT. Off NE    Interim History / Subjective:  1 PRBC overnight for hgb 6.4  Plt down to 27  INR 3.2    Cr down to 3.3 from 4.68  Weaned of NE with blood product resusc yesterday  Objective   Blood pressure (!) 174/62, pulse 69, temperature 98 F (36.7 C), temperature source Oral, resp. rate 15, height 5\' 7"  (1.702 m), weight 84.9 kg, SpO2 98 %.        Intake/Output Summary (Last 24  hours) at 01/28/2022 1125 Last data filed at 01/28/2022 1100 Gross per 24 hour  Intake 3611.27 ml  Output 2098.4 ml  Net 1512.87 ml   Filed Weights   02/12/22 1300 01/28/22 0500  Weight: 88.5 kg 84.9 kg    Examination: General: Chronically and acutely ill middle aged M HENT: NCAT pink mm. Scleral icterus  Lungs: CTAb even unlabored on [facility] Cardiovascular: rrr s1s2 no rgm  Abdomen: protuberant. + bowel sounds Extremities: no acute joint deformity. Pitting edema  Neuro: lethargic. Following commands GU: foley, dark urine Skin: jaundice. Clean. Dry   Resolved Hospital Problem list     Assessment & Plan:   Acute respiratory failure with hypoxia Hx OSA on CPAP -6L HFNC this morning -- ? TRALI, pulm edema P -qHS CPAP -PRN CXR   Decompensated alcoholic cirrhosis with ascites Hepatic encephalopathy without coma Hemorrhagic shock, improved ABLA due to GIB from rectal varices Coagulopathy Thrombocytopenia Hyperbilirubinemia  -MELD-Na 40 and Maddrey DF 111 - weaned off pressors -s/p Vit K and DDAVP -no further BRBPR P -lactulose, rifaximin -octreotide -follow CBC, INR -- transfuse as needed  -consider txa if hemorrhages -consider additional albumin  -GI following  AKI Hepatorenal syndrome Hyponatremia Hyperkalemia  NAGMA  P - CRRT per nephrology 3-5d trial to see if this is reversible  -foley, strict I/O  DM2 - SSI  OSA on CPAP - qHS CPAP  Best Practice (right click and "Reselect all SmartList Selections" daily)   Diet/type:  NPO w/ oral meds DVT prophylaxis: not indicated GI prophylaxis: PPI Lines: Central line Foley:  Yes, and it is still needed Code Status:  full code Last date of multidisciplinary goals of care discussion [10/3 discussed with patient and family]  Labs   CBC: Recent Labs  Lab 01/25/22 0039 01/25/22 1845 01/26/22 0036 01/27/22 0024 01/27/22 2158 01/28/22 0621  WBC 15.9* 11.7* 9.1 7.6  --  7.8  NEUTROABS 8.2* 9.3* 7.5 6.1   --  6.0  HGB 10.8* 9.2* 8.8* 7.9* 6.4* 7.5*  HCT 31.5* 26.6* 25.6* 23.8* 18.3* 21.6*  MCV 97.2 97.4 98.5 99.6  --  94.3  PLT 49* 30* 30* 32*  --  27*    Basic Metabolic Panel: Recent Labs  Lab 01/19/2022 2056 01/25/22 0039 01/26/22 0036 01/26/22 0617 01/26/22 1804 01/27/22 0024 01/27/22 0621 01/27/22 2158 01/28/22 0621  NA 125*   < > 125*   < > 128* 128* 129* 131* 134*  K 5.9*   < > 5.3*   < > 5.2* 5.5* 5.4* 4.7 4.6  CL 99   < > 100   < > 100 100 100 102 102  CO2 17*   < > 16*   < > 19* 18* 18* 21* 21*  GLUCOSE 174*   < > 187*   < > 192* 176* 170* 152* 181*  BUN 71*   < > 85*   < > 94* 96* 95* 77* 60*  CREATININE 2.88*   < > 4.24*   < > 4.76* 5.23* 5.84* 4.68* 3.30*  CALCIUM 6.9*   < > 6.9*   < > 7.1* 7.2* 7.3* 6.9* 7.1*  MG  --   --  2.8*  --   --  3.1*  --   --  2.7*  PHOS 8.4*  --  7.7*  --   --  8.2*  --  6.0* 4.6   < > = values in this interval not displayed.   GFR: Estimated Creatinine Clearance: 25.1 mL/min (A) (by C-G formula based on SCr of 3.3 mg/dL (H)). Recent Labs  Lab 01/11/2022 0430 01/09/2022 1019 01/25/22 0039 01/25/22 1845 01/26/22 0036 01/27/22 0024 01/28/22 0621  PROCALCITON  --   --  0.98  --   --   --   --   WBC  --   --  15.9* 11.7* 9.1 7.6 7.8  LATICACIDVEN 3.3* 3.8* 2.7*  --   --   --   --     Liver Function Tests: Recent Labs  Lab 01/26/22 0617 01/26/22 1204 01/26/22 1804 01/27/22 0024 01/27/22 0621 01/27/22 2158 01/28/22 0621  AST 169* 144* 137* 137* 144*  --   --   ALT 132* 117* 111* 115* 112*  --   --   ALKPHOS 73 67 62 74 70  --   --   BILITOT 30.9* 32.5* 30.7* 33.1* 32.9*  --   --   PROT 4.4* 5.6* 5.0* 5.1* 5.0*  --   --   ALBUMIN 2.5* 3.8 3.5 3.4* 3.4* 3.2* 3.1*   No results for input(s): "LIPASE", "AMYLASE" in the last 168 hours. Recent Labs  Lab 01/16/2022 0430 01/25/22 1208  AMMONIA 50* 55*    ABG    Component Value Date/Time   PHART 7.417 08/02/2018 1050   PCO2ART 37.4 08/02/2018 1050   PO2ART 72.2 (L) 08/02/2018  1050   HCO3 20.0 01/12/2022 0411   ACIDBASEDEF 5.2 (H) 01/10/2022 0411   O2SAT 81.6 01/01/2022 0411  Coagulation Profile: Recent Labs  Lab 07-Feb-2022 0411 01/25/22 0039 01/27/22 0024 01/28/22 0621  INR 2.3* 2.9* 3.7* 3.2*    Cardiac Enzymes: No results for input(s): "CKTOTAL", "CKMB", "CKMBINDEX", "TROPONINI" in the last 168 hours.  HbA1C: Hgb A1c MFr Bld  Date/Time Value Ref Range Status  01/21/2022 05:45 AM 5.9 (H) 4.8 - 5.6 % Final    Comment:    (NOTE) Pre diabetes:          5.7%-6.4%  Diabetes:              >6.4%  Glycemic control for   <7.0% adults with diabetes   01/29/2021 02:06 PM 5.8 4.6 - 6.5 % Final    Comment:    Glycemic Control Guidelines for People with Diabetes:Non Diabetic:  <6%Goal of Therapy: <7%Additional Action Suggested:  >8%     CBG: Recent Labs  Lab 01/27/22 0847 01/27/22 1245 01/27/22 1611 01/27/22 2004 01/28/22 0740  GLUCAP 206* 203* 163* 137* 175*   CRITICAL CARE Performed by: Lanier Clam   Total critical care time: 38 minutes  Critical care time was exclusive of separately billable procedures and treating other patients. Critical care was necessary to treat or prevent imminent or life-threatening deterioration.  Critical care was time spent personally by me on the following activities: development of treatment plan with patient and/or surrogate as well as nursing, discussions with consultants, evaluation of patient's response to treatment, examination of patient, obtaining history from patient or surrogate, ordering and performing treatments and interventions, ordering and review of laboratory studies, ordering and review of radiographic studies, pulse oximetry and re-evaluation of patient's condition.  Tessie Fass MSN, AGACNP-BC [facility] Pulmonary/Critical Care Medicine Amion for pager  01/28/2022, 11:25 AM

## 2022-01-28 NOTE — Progress Notes (Addendum)
Addendum:  Pt has decided on DNR code status -- wife and daughter at bedside for this conversation.  For now would like to continue all offered aggressive interventions otherwise, but is continuing to discuss possible transition to hospice care with his family  ____________________________________    Per family, pt having varying thoughts regarding course of tx (has both articulated wanting to go home ((ie hospice)) and wanting to continue aggressive care, considering DNR) . For now, continue offered aggressive tx and full code while pt & family continue to discuss.  Consider palliative care consult tomorrow to aid in [name] discussions   Eliseo Gum MSN, AGACNP-BC [place] 01/28/2022, 6:06 PM

## 2022-01-28 NOTE — Progress Notes (Signed)
[facility] Progress Note Patient Name: Leroy Bell DOB: 02-10-1961 MRN: [id number]   Date of Service  01/28/2022  HPI/Events of Note  Patient needs Insulin coverage orders reconciled.  eICU Interventions  Insulin coverage orders reconciled.        Kerry Kass Keilly Fatula 01/28/2022, 9:01 PM

## 2022-01-28 NOTE — Progress Notes (Signed)
[facility] Gastroenterology Progress Note  Leroy Bell 61 y.o. 1960/11/10   Subjective: Patient seen and examined laying in bed. Patient was lethargic but able to give non-verbal answers to questions. Family at bedside. Patient had one bowel movement with streaks of bright red blood this AM but no large volume rectal bleeding overnight. Pt currently on CRRT.  Objective: Vital signs in last 24 hours: Vitals:   01/28/22 1000 01/28/22 1100  BP: (!) 154/57 (!) 174/62  Pulse: 64 69  Resp: 12 15  Temp:    SpO2: 98% 98%   Physical Exam:  General:  no distress, appears stated age, deeply icteric  Head:  Normocephalic, without obvious abnormality, atraumatic  Eyes:  icteric sclera, EOM's intact  Lungs:   Clear to auscultation bilaterally, respirations unlabored, 6 L [place]  Heart:  Regular rate and rhythm, S1, S2 normal  Abdomen:   Soft, non-tender, distended, bowel sounds active all four quadrants,  no masses,   Extremities: Extremities normal, atraumatic, pitting Edema  Pulses: 2+ and symmetric    Lab Results: Recent Labs    01/27/22 0024 01/27/22 0621 01/27/22 2158 01/28/22 0621  NA 128*   < > 131* 134*  K 5.5*   < > 4.7 4.6  CL 100   < > 102 102  CO2 18*   < > 21* 21*  GLUCOSE 176*   < > 152* 181*  BUN 96*   < > 77* 60*  CREATININE 5.23*   < > 4.68* 3.30*  CALCIUM 7.2*   < > 6.9* 7.1*  MG 3.1*  --   --  2.7*  PHOS 8.2*  --  6.0* 4.6   < > = values in this interval not displayed.   Recent Labs    01/27/22 0024 01/27/22 0621 01/27/22 2158 01/28/22 0621  AST 137* 144*  --   --   ALT 115* 112*  --   --   ALKPHOS 74 70  --   --   BILITOT 33.1* 32.9*  --   --   PROT 5.1* 5.0*  --   --   ALBUMIN 3.4* 3.4* 3.2* 3.1*   Recent Labs    01/27/22 0024 01/27/22 2158 01/28/22 0621  WBC 7.6  --  7.8  NEUTROABS 6.1  --  6.0  HGB 7.9* 6.4* 7.5*  HCT 23.8* 18.3* 21.6*  MCV 99.6  --  94.3  PLT 32*  --  27*   Recent Labs    01/27/22 0024 01/28/22 0621  LABPROT 36.3*  32.6*  INR 3.7* 3.2*      Assessment Decompensated alcoholic cirrhosis History of esophageal varices Portal hypertensive gastropathy Hepatic encephalopathy Recurrent ascites Hepatorenal syndrome Painless hematochezia   HGB 7.5(7.9) Platelets 27(32) AST 144 ALT 112  Alkphos 70 TBili 32.9 GFR 21  INR 01/28/2022 3.2  MELD 3.0: 40 at 01/28/2022  6:21 AM MELD-Na: 44 at 01/28/2022  6:21 AM Calculated from: Serum Creatinine: 3.30 mg/dL (Using max of 3 mg/dL) at 41/05/8784  7:67 AM Serum Sodium: 134 mmol/L at 01/28/2022  6:21 AM Total Bilirubin: 32.9 mg/dL at 20/12/4707  6:28 AM Serum Albumin: 3.1 g/dL at 36/09/2945  6:54 AM INR(ratio): 3.2 at 01/28/2022  6:21 AM Age at listing (hypothetical): 61 years Sex: Male at 01/28/2022  6:21 AM  Awaiting hepatic function panel for 10/4 to evaluate LFT and trend T. Bili. Hemoglobin 7.5(6.4) (s/p 2 units of FFP, 3 pRBC, 1 platelet pheresis, 1 cryoprecipitate in the last 24 hours), INR improved 3.2(3.7) overnight.  CT Angio GI bleed 10/3 with findings of bleeding likely from rectal varices or internal hemorrhoids. No significant episodes of hematochezia today.   Patient is now on CRRT.   Plan: Continue lactulose 30 g 3 times daily, Xifaxan 550 mg twice daily. Continue pantoprazole 40 mg twice daily Continue octreotide 50 mcg/h Continue midodrine 10 mg 3 times daily Continue supportive care measures Continue to monitor hemoglobin and transfuse if less than 7 [facility] will follow, guarded prognosis  Charlott Rakes PA-C 01/28/2022, 11:35 AM  Contact #  [phone]

## 2022-01-29 DIAGNOSIS — N179 Acute kidney failure, unspecified: Secondary | ICD-10-CM | POA: Diagnosis not present

## 2022-01-29 DIAGNOSIS — K7031 Alcoholic cirrhosis of liver with ascites: Secondary | ICD-10-CM | POA: Diagnosis not present

## 2022-01-29 DIAGNOSIS — D696 Thrombocytopenia, unspecified: Secondary | ICD-10-CM

## 2022-01-29 DIAGNOSIS — D689 Coagulation defect, unspecified: Secondary | ICD-10-CM | POA: Diagnosis not present

## 2022-01-29 DIAGNOSIS — D62 Acute posthemorrhagic anemia: Secondary | ICD-10-CM

## 2022-01-29 LAB — HEPATIC FUNCTION PANEL
ALT: 73 U/L — ABNORMAL HIGH (ref 0–44)
AST: 90 U/L — ABNORMAL HIGH (ref 15–41)
Albumin: 3.9 g/dL (ref 3.5–5.0)
Alkaline Phosphatase: 63 U/L (ref 38–126)
Bilirubin, Direct: 16.6 mg/dL — ABNORMAL HIGH (ref 0.0–0.2)
Indirect Bilirubin: 10.9 mg/dL — ABNORMAL HIGH (ref 0.3–0.9)
Total Bilirubin: 27.5 mg/dL (ref 0.3–1.2)
Total Protein: 5 g/dL — ABNORMAL LOW (ref 6.5–8.1)

## 2022-01-29 LAB — TYPE AND SCREEN
ABO/RH(D): O POS
Antibody Screen: NEGATIVE
Unit division: 0
Unit division: 0

## 2022-01-29 LAB — RENAL FUNCTION PANEL
Albumin: 3.9 g/dL (ref 3.5–5.0)
Albumin: 3.8 g/dL (ref 3.5–5.0)
Anion gap: 8 (ref 5–15)
Anion gap: 5 (ref 5–15)
BUN: 33 mg/dL — ABNORMAL HIGH (ref 6–20)
BUN: 25 mg/dL — ABNORMAL HIGH (ref 6–20)
CO2: 24 mmol/L (ref 22–32)
CO2: 25 mmol/L (ref 22–32)
Calcium: 7.8 mg/dL — ABNORMAL LOW (ref 8.9–10.3)
Calcium: 7.9 mg/dL — ABNORMAL LOW (ref 8.9–10.3)
Chloride: 104 mmol/L (ref 98–111)
Chloride: 106 mmol/L (ref 98–111)
Creatinine, Ser: 1.52 mg/dL — ABNORMAL HIGH (ref 0.61–1.24)
Creatinine, Ser: 1.15 mg/dL (ref 0.61–1.24)
GFR, Estimated: 52 mL/min — ABNORMAL LOW (ref >60)
GFR, Estimated: >60 mL/min (ref >60)
Glucose, Bld: 150 mg/dL — ABNORMAL HIGH (ref 70–99)
Glucose, Bld: 255 mg/dL — ABNORMAL HIGH (ref 70–99)
Phosphorus: 3.8 mg/dL (ref 2.5–4.6)
Phosphorus: 3.6 mg/dL (ref 2.5–4.6)
Potassium: 4.4 mmol/L (ref 3.5–5.1)
Potassium: 4.5 mmol/L (ref 3.5–5.1)
Sodium: 136 mmol/L (ref 135–145)
Sodium: 136 mmol/L (ref 135–145)

## 2022-01-29 LAB — BPAM RBC
Blood Product Expiration Date: 202310312359
Blood Product Expiration Date: 202311072359
ISSUE DATE / TIME: 202310040126
ISSUE DATE / TIME: 202310041932
Unit Type and Rh: 5100
Unit Type and Rh: 5100

## 2022-01-29 LAB — PROTIME-INR
INR: 3.3 — ABNORMAL HIGH (ref 0.8–1.2)
Prothrombin Time: 33.6 seconds — ABNORMAL HIGH (ref 11.4–15.2)

## 2022-01-29 LAB — HEMOGLOBIN AND HEMATOCRIT, BLOOD
HCT: 22.5 % — ABNORMAL LOW (ref 39.0–52.0)
HCT: 24 % — ABNORMAL LOW (ref 39.0–52.0)
HCT: 24.8 % — ABNORMAL LOW (ref 39.0–52.0)
Hemoglobin: 7.9 g/dL — ABNORMAL LOW (ref 13.0–17.0)
Hemoglobin: 8.3 g/dL — ABNORMAL LOW (ref 13.0–17.0)
Hemoglobin: 8.3 g/dL — ABNORMAL LOW (ref 13.0–17.0)

## 2022-01-29 LAB — GLUCOSE, CAPILLARY
Glucose-Capillary: 129 mg/dL — ABNORMAL HIGH (ref 70–99)
Glucose-Capillary: 166 mg/dL — ABNORMAL HIGH (ref 70–99)
Glucose-Capillary: 224 mg/dL — ABNORMAL HIGH (ref 70–99)

## 2022-01-29 LAB — MAGNESIUM: Magnesium: 2.7 mg/dL — ABNORMAL HIGH (ref 1.7–2.4)

## 2022-01-29 LAB — APTT: aPTT: 74 seconds — ABNORMAL HIGH (ref 24–36)

## 2022-01-29 MED ORDER — MORPHINE SULFATE (PF) 2 MG/ML IV SOLN
2.0000 mg | INTRAVENOUS | Status: DC | PRN
  Administered 2022-01-29 – 2022-01-30 (×4): 2 mg via INTRAVENOUS
  Filled 2022-01-29 (×4): qty 1

## 2022-01-29 MED ORDER — SODIUM CHLORIDE 0.9 % IV SOLN: INTRAVENOUS | Status: DC

## 2022-01-29 MED ORDER — GLYCOPYRROLATE 0.2 MG/ML IJ SOLN: 0.2000 mg | INTRAMUSCULAR | Status: DC | PRN

## 2022-01-29 MED ORDER — SODIUM CHLORIDE 0.9% IV SOLUTION: Freq: Once | INTRAVENOUS | Status: AC

## 2022-01-29 MED ORDER — HYDROMORPHONE HCL 1 MG/ML IJ SOLN: 1.0000 mg | INTRAMUSCULAR | Status: DC | PRN

## 2022-01-29 MED ORDER — HALOPERIDOL LACTATE 5 MG/ML IJ SOLN: 2.5000 mg | INTRAMUSCULAR | Status: DC | PRN

## 2022-01-29 MED ORDER — ONDANSETRON 4 MG PO TBDP: 4.0000 mg | ORAL_TABLET | Freq: Four times a day (QID) | ORAL | Status: DC | PRN

## 2022-01-29 MED ORDER — ONDANSETRON HCL 4 MG/2ML IJ SOLN: 4.0000 mg | Freq: Four times a day (QID) | INTRAMUSCULAR | Status: DC | PRN

## 2022-01-29 MED ORDER — "THROMBI-PAD 3""X3"" EX PADS"
1.0000 | MEDICATED_PAD | Freq: Once | CUTANEOUS | Status: AC | PRN
  Administered 2022-01-29: 1 via TOPICAL
  Filled 2022-01-29: qty 1

## 2022-01-29 MED ORDER — "THROMBI-PAD 3""X3"" EX PADS": 1.0000 | MEDICATED_PAD | CUTANEOUS | Status: DC | PRN

## 2022-01-29 MED ORDER — VITAMIN K1 10 MG/ML IJ SOLN
10.0000 mg | Freq: Once | INTRAVENOUS | Status: AC
  Administered 2022-01-29: 10 mg via INTRAVENOUS
  Filled 2022-01-29: qty 1

## 2022-01-29 MED ORDER — POLYVINYL ALCOHOL 1.4 % OP SOLN: 1.0000 [drp] | Freq: Four times a day (QID) | OPHTHALMIC | Status: DC | PRN

## 2022-01-29 MED ORDER — MIDAZOLAM HCL 2 MG/2ML IJ SOLN: 2.0000 mg | INTRAMUSCULAR | Status: DC | PRN

## 2022-01-29 MED ORDER — GLYCOPYRROLATE 1 MG PO TABS: 1.0000 mg | ORAL_TABLET | ORAL | Status: DC | PRN

## 2022-01-29 MED ORDER — LORAZEPAM 2 MG/ML IJ SOLN
0.5000 mg | INTRAMUSCULAR | Status: DC | PRN
  Administered 2022-01-29: 1 mg via INTRAVENOUS
  Filled 2022-01-29: qty 1

## 2022-01-29 MED ORDER — GLYCOPYRROLATE 0.2 MG/ML IJ SOLN
0.2000 mg | INTRAMUSCULAR | Status: DC | PRN
  Administered 2022-01-30: 0.2 mg via INTRAVENOUS
  Filled 2022-01-29: qty 1

## 2022-01-29 NOTE — Progress Notes (Signed)
[facility] Gastroenterology Progress Note  Leroy Bell 61 y.o. [birth year]  Subjective: Patient seen and examined laying in bed. Patient was able to tell me his name and he was in [facility] hospital. Family at bedside. Patient had one bowel movement with no blood this AM. Pt currently on CRRT.   Objective: Vital signs in last 24 hours: Vitals:   01/29/22 1200 01/29/22 1300  BP: (!) 170/67 (!) 162/72  Pulse: (!) 58 (!) 58  Resp: 16 15  Temp: 98 F (36.7 C)   SpO2: 100% 100%    Physical Exam:  General:  Alert, cooperative, no distress, appears stated age, deeply icteric, ill appearing.   Head:  Normocephalic, without obvious abnormality, atraumatic  Eyes:  icteric sclera, EOM's intact  Lungs:   Clear to auscultation bilaterally, respirations unlabored  Heart:  Regular rate and rhythm, S1, S2 normal  Abdomen:   Soft, non-tender, distended, bowel sounds active all four quadrants,  no masses,   Extremities: Extremities normal, atraumatic, no  edema  Pulses: 2+ and symmetric    Lab Results: Recent Labs    01/28/22 0621 01/28/22 1708 01/29/22 0348 01/29/22 0349  NA 134* 136  --  136  K 4.6 4.3  --  4.4  CL 102 103  --  104  CO2 21* 25  --  24  GLUCOSE 181* 176*  --  150*  BUN 60* 43*  --  33*  CREATININE 3.30* 2.15*  --  1.52*  CALCIUM 7.1* 7.7*  --  7.8*  MG 2.7*  --  2.7*  --   PHOS 4.6 3.8  --  3.8   Recent Labs    01/28/22 1022 01/28/22 1708 01/29/22 0348 01/29/22 0349  AST 109*  --  90*  --   ALT 87*  --  73*  --   ALKPHOS 66  --  63  --   BILITOT 27.4*  --  27.5*  --   PROT 4.8*  --  5.0*  --   ALBUMIN 3.5   < > 3.9 3.9   < > = values in this interval not displayed.   Recent Labs    01/28/22 0621 01/28/22 1208 01/28/22 1600 01/29/22 0000 01/29/22 0348 01/29/22 0632  WBC 7.8  --   --   --  7.2  --   NEUTROABS 6.0  --   --   --  5.5  --   HGB 7.5*   < > 6.9*   < > 8.2* 8.3*  HCT 21.6*   < > 19.8*   < > 23.9* 24.0*  MCV 94.3  --   --   --  93.7  --    PLT 27*  --  24*  --  21*  23*  --    < > = values in this interval not displayed.   Recent Labs    01/28/22 1600 01/29/22 0348  LABPROT 35.4* 34.3*  33.6*  INR 3.6* 3.4*  3.3*      Decompensated alcoholic cirrhosis History of esophageal varices Portal hypertensive gastropathy Hepatic encephalopathy Recurrent ascites Hepatorenal syndrome Painless hematochezia    HGB 8.3(7.9) Platelets 23; 21 AST 90(144) ALT 73(112)  Alkphos 63(70) TBili 27.5(32.9) GFR 52  INR 01/29/2022 3.3; 3.4  MELD 3.0: 37 at 01/29/2022  3:49 AM MELD-Na: 36 at 01/29/2022  3:49 AM Calculated from: Serum Creatinine: 1.52 mg/dL at 01/29/2022  3:49 AM Serum Sodium: 136 mmol/L at 01/29/2022  3:49 AM Total Bilirubin: 27.5 mg/dL at 01/29/2022  3:48 AM Serum Albumin: 3.9 g/dL (Using max of 3.5 g/dL) at 01/29/2022  3:49 AM INR(ratio): 3.3 at 01/29/2022  3:48 AM Age at listing (hypothetical): 61 years Sex: Male at 01/29/2022  3:49 AM   Awaiting hepatic function panel for 10/4 to evaluate LFT and trend T. Bili. Hemoglobin 8.3(8.2) (s/p 2 units of pRBC 10/4)   CT Angio GI bleed 10/3 with findings of bleeding likely from rectal varices or internal hemorrhoids. No significant episodes of hematochezia today.   Patient is now on CRRT.    Plan: Continue lactulose 30 g 3 times daily, Xifaxan 550 mg twice daily. Continue pantoprazole 40 mg twice daily Continue octreotide 50 mcg/h Continue midodrine 10 mg 3 times daily Continue supportive care measures Continue to monitor hemoglobin and transfuse if less than 7 [facility] will follow, guarded prognosis   Charlott Rakes PA-C 01/29/2022, 1:44 PM  Contact #  [phone]

## 2022-01-29 NOTE — Progress Notes (Signed)
[facility] Progress Note Patient Name: Leroy Bell DOB: November 29, 1960 MRN: [id number]   Date of Service  01/29/2022  HPI/Events of Note  Patient with oozing from HD catheter insertion site, INR 3.4, PLT 21 K,  Fibrinogen 92, PTT 73. Recent GI bleeding.  eICU Interventions  Will transfuse a unit of platelets, and a unit of cryoprecipitate.        Leroy Bell 01/29/2022, 6:16 AM

## 2022-01-29 NOTE — Progress Notes (Signed)
[facility] Progress Note  Subjective: 250 cc UOP yesterday, BPs normal to a bit high. UF net neg 2.9 L yest and 2.5 L today so far. Labs stable.   Vitals:   01/29/22 0506 01/29/22 0507 01/29/22 0600 01/29/22 0700  BP:   (!) 149/51 (!) 141/67  Pulse: 67 66 69 65  Resp: 15 16 16 16   Temp:      TempSrc:      SpO2: 98% 98% 98% 98%  Weight:      Height:        Exam: Gen chron ill appearing, no distress No jvd or bruits Chest clear bilat to bases RRR no MRG Abd firm, distended, 2+ ascites  Ext diffuse 2-3+ LE/ UE edema Foley dark urine Neuro as above      Home meds include - carvedilol 3.125 bid, insulin detemir, pantoprazole, prednisone, rifaximin 550 bid, thiamine, prns/ vits/ supps        Date                       Creat               eGFR     2020- 21                  0.78- 0.94                         2021                        0.82     Mar- may 2022        0.77- 0.97     Oct 2022                 0.63     9/15- 01/21/22           1.94 >> 1.04    AKI episode,   39 -  > 60 ml/min     9/30                         1.63, 2.88     10/1                         3.07, 3.64      10/2                        4.24, 4.39               I/O here = 6.0 L in and 225 cc UOP out = 5.7 L+      BPs 90s- 110 1st day here, last 2 days 115- 135/ 50- 70      Did not require pressors here      Alb 1.9 admit >> 2.5 today      Na 126  K+ 5.3  CO2 17  BUN 88  Creat 4.39  Ca 6.9  mg 2.8  phos 7.7        AST 169, AlT 132  tbili 30  eGFR 15 today        wBC 9k  Hb 8.8  plt 30k       UA prot 100, >50 rbc, 11-20wbc, 0-5 epi, rare bact         UNa 11,  UCr 63  CT angio abd/ GI bleed 9/30 > Adrenals/Urinary Tract: The adrenal glands are within normal limits, the kidneys enhance symmetrically. Multiple scattered hypodensities are present in the kidneys, possible cysts. No renal calculus or hydronephrosis.                 Assessment/ Plan: AKI - b/l creatinine 1.0 from 01/21/22,  eGFR > 60 ml/min.  Creat here was 1.6 on admission in setting of acute, recurrent GIB. UA w/ microhematuria, mild proteinuria. UNa low, UCr mid range. BP's soft initially then normal now. +IV contrast on 9/30 for CT angio GIB.  Suspect this is ATN from contrast given on day of admission but we can't r/o some degree of underlying HRS as well. We continued IV albumin and octreotide and added midodrine 10 tid yesterday. Unfortunately, creat continued to worsen and so pt was started on CRRT 10/03 for a trial period of several days. His vol overload and azotemia is much better, he is not really anymore alert and is still very ill. Talked w/ the wife, we will dc CRRT tomorrow morning and if renal function doesn't recover, would not pursue any further RRT and would transition to hospice care. She is agreeable to this plan. Will follow.  AHRF - 6 L > 4 L [place] today, edema much better on exam AMS - stable Volume - still edematous but sig improvement Cirrhosis/ etoh abuse - decompensated cirrhosis w/ etoh hepatitis and MELD >40. Not liver transplant candidate. Hepatic encephalopathy - per GI/ pmd  Rob Lyndsee Casa 01/29/2022, 7:21 AM   Recent Labs  Lab 01/28/22 1708 01/29/22 0000 01/29/22 0348 01/29/22 0349 01/29/22 0632  HGB  --    < > 8.2*  --  8.3*  ALBUMIN 4.3  --  3.9 3.9  --   CALCIUM 7.7*  --   --  7.8*  --   PHOS 3.8  --   --  3.8  --   CREATININE 2.15*  --   --  1.52*  --   K 4.3  --   --  4.4  --    < > = values in this interval not displayed.    No results for input(s): "IRON", "TIBC", "FERRITIN" in the last 168 hours. Inpatient medications:  sodium chloride   Intravenous Once   Chlorhexidine Gluconate Cloth  6 each Topical Daily   insulin aspart  0-9 Units Subcutaneous TID AC & HS   lactulose  30 g Oral TID   midodrine  10 mg Oral TID WC   pantoprazole (PROTONIX) IV  40 mg Intravenous Q12H   rifaximin  550 mg Oral BID     prismasol BGK 4/2.5 400 mL/hr at 01/29/22 0509    prismasol BGK  4/2.5 400 mL/hr at 01/29/22 0509   norepinephrine (LEVOPHED) Adult infusion Stopped (01/27/22 1907)   octreotide (SANDOSTATIN) 500 mcg in sodium chloride 0.9 % 250 mL (2 mcg/mL) infusion 50 mcg/hr (01/29/22 0700)   prismasol BGK 4/2.5 1,500 mL/hr at 01/29/22 0509   ALPRAZolam, alteplase, heparin sodium (porcine), HYDROmorphone (DILAUDID) injection, hydrOXYzine, mouth rinse, sodium chloride

## 2022-01-29 NOTE — Progress Notes (Signed)
NAME:  Leroy Bell, MRN:  [id number], DOB:  Jun 20, 1960, LOS: 5 ADMISSION DATE:  01/02/2022, CONSULTATION DATE:  01/27/22 REFERRING MD:  Dwyane Dee, MD CHIEF COMPLAINT:  GI Bleeding/Shock   History of Present Illness:  Leroy Bell is a 61 year old male with alcoholic cirrhosis with ascites, hepatic encephalopathy, esophageal varices, portal hypertensive gastropathy, hepatorenal syndrome and significant coagulopathy who was admitted 9/30 for GI bleeding. CTA was negative on admission for active extravasation. He was supported with multiple blood product transfusions. PCCM consulted this morning for HD catheter placement to pursue HD for hepatorenal syndrome but he started to have further GI bleeding with development of hypotension.   He is to have last drank on 01/09/22. MElD-Na 40 points, Maddrey DF 111 points.   Patient wishes to continue aggressive care measures. Family is at the bedside and understands the gravity of the situation.   Pertinent  Medical History   Past Medical History:  Diagnosis Date   Alcohol abuse    Cirrhosis ([place])    Coronary atherosclerosis due to calcified coronary lesion    Diabetes ([place])    Dyspnea    from anemia   Hyperlipidemia    Hypertension    Significant Hospital Events: Including procedures, antibiotic start and stop dates in addition to other pertinent events   9/30 admitted for lower GI bleeding 10/3 PCCM consulted for shock due to recurrent GI bleeding. Vit K, DDAVP, PRBC, FFP, Cryo. HD cath placed for 3-5d trial RRT. got 1 PRBC overnight 10/4 CRRT. Off NE. 1 PRBC. DNR 10/5 1 plt 1 cryo   Interim History / Subjective:  1 PRBC late shift yesterday Early this morning 1 plt 1 cryo  Objective   Blood pressure (!) 157/78, pulse 63, temperature 97.7 F (36.5 C), temperature source Axillary, resp. rate (!) 26, height 5\' 7"  (1.702 m), weight 84.5 kg, SpO2 100 %.        Intake/Output Summary (Last 24 hours) at 01/29/2022 1108 Last data  filed at 01/29/2022 1047 Gross per 24 hour  Intake 2112.34 ml  Output 7035.4 ml  Net -4923.06 ml   Filed Weights   01/13/2022 1300 01/28/22 0500 01/29/22 0500  Weight: 88.5 kg 84.9 kg 84.5 kg    Examination: General: Chronically and critically ill middle aged M HENT: NCAT. Scleral icterus. Tacky mm  Lungs:Even unlabored, symmetrical chest expansion  Cardiovascular: rrr s1s2 no rgm  Abdomen: protuberant, firm Extremities: Pitting edema  Neuro: drowsy. No focal deficit  GU: Foley, dark urine  Skin: Jaundice   Resolved Hospital Problem list   Hemorrhagic shock   Assessment & Plan:   Acute respiratory failure with hypoxia Hx OSA on CPAP -6L HFNC this morning -- ? TRALI, pulm edema P -qHS CPAP -PRN CXR  -IS  Decompensated alcoholic cirrhosis with ascites Hepatic encephalopathy without coma ABLA due to GIB from rectal varices Coagulopathy Thrombocytopenia Hyperbilirubinemia  Jaundice  -MELD-Na 40 and Maddrey DF 111 -s/p Vit K and DDAVP, PRBC FFP cryo plt  -no further BRBPR -weaned off NE  P -cont rifaximin, lactulose, octreotide, protonix -GI following -not a transplant candidate   AKI Hepatorenal syndrome vs ATN  P - CRRT per nephrology 3-5d trial to see if this is reversible  -foley, strict I/O -follow renal indices  -in context of underlying liver dz, think it would be ok to liberalize diet to essentially pleasure feeds instead of a renal diet   DM2 - SSI  Anxiety Sleep disturbance P -PRN atarax, ativan  Goals of care DNR status -Code status discussed 10/4 and changed to DNR -patient is contemplating goals of care. He is coming to terms with severity and prognosis of his liver disease, which is of course difficult. For now wishes to continue aggressive supportive care but has vocalized some sentiments re palliation as well. -Open to palliative care consult for add'l support, will place consult  Best Practice (right click and "Reselect all SmartList  Selections" daily)   Diet/type: Regular consistency (see orders) DVT prophylaxis: not indicated GI prophylaxis: PPI Lines: Central line Foley:  Yes, and it is still needed Code Status:  DNR Last date of multidisciplinary goals of care discussion [10/4]  Labs   CBC: Recent Labs  Lab 01/25/22 1845 01/26/22 0036 01/27/22 0024 01/27/22 2158 01/28/22 0621 01/28/22 1208 01/28/22 1600 01/29/22 0000 01/29/22 0348 01/29/22 0632  WBC 11.7* 9.1 7.6  --  7.8  --   --   --  7.2  --   NEUTROABS 9.3* 7.5 6.1  --  6.0  --   --   --  5.5  --   HGB 9.2* 8.8* 7.9*   < > 7.5* 7.6* 6.9* 7.9* 8.2* 8.3*  HCT 26.6* 25.6* 23.8*   < > 21.6* 22.0* 19.8* 22.5* 23.9* 24.0*  MCV 97.4 98.5 99.6  --  94.3  --   --   --  93.7  --   PLT 30* 30* 32*  --  27*  --  24*  --  21*  23*  --    < > = values in this interval not displayed.    Basic Metabolic Panel: Recent Labs  Lab 01/26/22 0036 01/26/22 0617 01/27/22 0024 01/27/22 6789 01/27/22 2158 01/28/22 3810 01/28/22 1708 01/29/22 0348 01/29/22 0349  NA 125*   < > 128* 129* 131* 134* 136  --  136  K 5.3*   < > 5.5* 5.4* 4.7 4.6 4.3  --  4.4  CL 100   < > 100 100 102 102 103  --  104  CO2 16*   < > 18* 18* 21* 21* 25  --  24  GLUCOSE 187*   < > 176* 170* 152* 181* 176*  --  150*  BUN 85*   < > 96* 95* 77* 60* 43*  --  33*  CREATININE 4.24*   < > 5.23* 5.84* 4.68* 3.30* 2.15*  --  1.52*  CALCIUM 6.9*   < > 7.2* 7.3* 6.9* 7.1* 7.7*  --  7.8*  MG 2.8*  --  3.1*  --   --  2.7*  --  2.7*  --   PHOS 7.7*  --  8.2*  --  6.0* 4.6 3.8  --  3.8   < > = values in this interval not displayed.   GFR: Estimated Creatinine Clearance: 54.4 mL/min (A) (by C-G formula based on SCr of 1.52 mg/dL (H)). Recent Labs  Lab 01/02/2022 0430 01/03/2022 1019 01/25/22 0039 01/25/22 1845 01/26/22 0036 01/27/22 0024 01/28/22 0621 01/29/22 0348  PROCALCITON  --   --  0.98  --   --   --   --   --   WBC  --   --  15.9*   < > 9.1 7.6 7.8 7.2  LATICACIDVEN 3.3* 3.8* 2.7*   --   --   --   --   --    < > = values in this interval not displayed.    Liver Function Tests: Recent Labs  Lab  01/26/22 1804 01/27/22 0024 01/27/22 2956 01/27/22 2158 01/28/22 0621 01/28/22 1022 01/28/22 1708 01/29/22 0348 01/29/22 0349  AST 137* 137* 144*  --   --  109*  --  90*  --   ALT 111* 115* 112*  --   --  87*  --  73*  --   ALKPHOS 62 74 70  --   --  66  --  63  --   BILITOT 30.7* 33.1* 32.9*  --   --  27.4*  --  27.5*  --   PROT 5.0* 5.1* 5.0*  --   --  4.8*  --  5.0*  --   ALBUMIN 3.5 3.4* 3.4*   < > 3.1* 3.5 4.3 3.9 3.9   < > = values in this interval not displayed.   No results for input(s): "LIPASE", "AMYLASE" in the last 168 hours. Recent Labs  Lab 01/05/2022 0430 01/25/22 1208  AMMONIA 50* 55*    ABG    Component Value Date/Time   PHART 7.417 08/02/2018 1050   PCO2ART 37.4 08/02/2018 1050   PO2ART 72.2 (L) 08/02/2018 1050   HCO3 20.0 12/29/2021 0411   ACIDBASEDEF 5.2 (H) 01/22/2022 0411   O2SAT 81.6 01/06/2022 0411     Coagulation Profile: Recent Labs  Lab 01/25/22 0039 01/27/22 0024 01/28/22 0621 01/28/22 1600 01/29/22 0348  INR 2.9* 3.7* 3.2* 3.6* 3.4*  3.3*    Cardiac Enzymes: No results for input(s): "CKTOTAL", "CKMB", "CKMBINDEX", "TROPONINI" in the last 168 hours.  HbA1C: Hgb A1c MFr Bld  Date/Time Value Ref Range Status  01/21/2022 05:45 AM 5.9 (H) 4.8 - 5.6 % Final    Comment:    (NOTE) Pre diabetes:          5.7%-6.4%  Diabetes:              >6.4%  Glycemic control for   <7.0% adults with diabetes   01/29/2021 02:06 PM 5.8 4.6 - 6.5 % Final    Comment:    Glycemic Control Guidelines for People with Diabetes:Non Diabetic:  <6%Goal of Therapy: <7%Additional Action Suggested:  >8%     CBG: Recent Labs  Lab 01/28/22 0740 01/28/22 1125 01/28/22 1547 01/28/22 2019 01/29/22 0804  GLUCAP 175* 163* 161* 196* 129*   CRITICAL CARE Performed by: Lanier Clam   Total critical care time: 37 minutes  Critical care  time was exclusive of separately billable procedures and treating other patients. Critical care was necessary to treat or prevent imminent or life-threatening deterioration.  Critical care was time spent personally by me on the following activities: development of treatment plan with patient and/or surrogate as well as nursing, discussions with consultants, evaluation of patient's response to treatment, examination of patient, obtaining history from patient or surrogate, ordering and performing treatments and interventions, ordering and review of laboratory studies, ordering and review of radiographic studies, pulse oximetry and re-evaluation of patient's condition.  Tessie Fass MSN, AGACNP-BC [facility] Pulmonary/Critical Care Medicine [place] for pager  01/29/2022, 11:08 AM

## 2022-01-29 NOTE — Progress Notes (Signed)
Nurse was notified by a family member that patient was bleeding from an unknown source. By the time staff arrived in the room the patients blood had already began running off the foot of the bed, forming a puddle in the floor. Bleeding appears to be coming from his rectum, as it had a few times prior during this admission. Patient had no other signs of bleeding throughout shift. During this time PCCM entered the room and the choice was made by the family to began comfort care, as patient condition was worsening. All interventions not focused on comfort of the patient was discontinued at this time. HD Cath was saline once blood was returned from CRRT. Nursing staff cleaned patient, but bleeding has been continuous. I discussed placing a flexi seal with the family for patient comfort, that decision will be made if it is still needed.

## 2022-01-29 NOTE — Progress Notes (Signed)
[place] Progress Note Patient Name: ALEXIZ SUSTAITA DOB: July 19, 1960 MRN: [id number]   Date of Service  01/29/2022  HPI/Events of Note  Patient with some oozing at HD catheter insertion site.  eICU Interventions  Thrombin pad pressure dressing ordered, will check PT, PTT, CBC.        Kerry Kass Vencil Basnett 01/29/2022, 4:11 AM

## 2022-01-29 NOTE — Progress Notes (Signed)
CCM Interval progress note  Alerted to abrupt onset GIB. On arrival pt has copious blood per rectum, is encephalopathic and lethargic, belly breathing, increased RR.  SBP down-trending from earlier today though still normotensive.   Discussed with family. 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 Discussed that mass transfusion would likely impact his respiratory status, may not stop this active bleed, and will not reverse his underlying liver disease.  I recommended transition to comfort care, to which family agrees.   Emotional support provided  P -DNR -Comfort care with focus on dignity at end of life.  -PRN morphine and versed are ordered. These can be increased in dose/frequency, or changed to an infusion based on symptoms.  -if tolerated,  can consider a flexiseal  -dc labs, imaging, medications and interventions not aimed at comfort including CRRT      Eliseo Gum MSN, AGACNP-BC [place] for pager  01/29/2022, 5:40 PM

## 2022-01-30 DIAGNOSIS — K625 Hemorrhage of anus and rectum: Secondary | ICD-10-CM | POA: Diagnosis not present

## 2022-01-30 DIAGNOSIS — Z515 Encounter for palliative care: Secondary | ICD-10-CM | POA: Diagnosis not present

## 2022-01-30 DIAGNOSIS — D689 Coagulation defect, unspecified: Secondary | ICD-10-CM | POA: Diagnosis not present

## 2022-01-30 DIAGNOSIS — K7031 Alcoholic cirrhosis of liver with ascites: Secondary | ICD-10-CM | POA: Diagnosis not present

## 2022-01-30 LAB — BPAM CRYOPRECIPITATE
Blood Product Expiration Date: 202310051323
ISSUE DATE / TIME: 202310051055
Unit Type and Rh: 5100

## 2022-01-30 LAB — PREPARE CRYOPRECIPITATE: Unit division: 0

## 2022-01-30 LAB — BPAM PLATELET PHERESIS
Blood Product Expiration Date: 202310062359
ISSUE DATE / TIME: 202310050725
Unit Type and Rh: 5100

## 2022-01-30 LAB — PREPARE PLATELET PHERESIS: Unit division: 0

## 2022-01-30 MED ORDER — MORPHINE 100MG IN NS 100ML (1MG/ML) PREMIX INFUSION
0.0000 mg/h | INTRAVENOUS | Status: DC
  Administered 2022-01-30: 5 mg/h via INTRAVENOUS
  Administered 2022-01-30: 10 mg/h via INTRAVENOUS
  Administered 2022-01-30: 15 mg/h via INTRAVENOUS
  Filled 2022-01-30: qty 100

## 2022-01-30 MED ORDER — MORPHINE BOLUS VIA INFUSION
5.0000 mg | INTRAVENOUS | Status: DC | PRN
  Administered 2022-01-30 (×3): 5 mg via INTRAVENOUS

## 2022-01-30 MED ORDER — HALOPERIDOL LACTATE 5 MG/ML IJ SOLN: 2.5000 mg | INTRAMUSCULAR | Status: DC | PRN

## 2022-01-30 MED ORDER — SODIUM CHLORIDE 0.9 % IV SOLN: INTRAVENOUS | Status: DC

## 2022-01-30 MED ORDER — LORAZEPAM 2 MG/ML IJ SOLN: 2.0000 mg | INTRAMUSCULAR | Status: DC | PRN

## 2022-01-30 MED ORDER — MORPHINE SULFATE (PF) 2 MG/ML IV SOLN: 2.0000 mg | INTRAVENOUS | Status: DC | PRN

## 2022-01-30 MED ORDER — MORPHINE SULFATE (PF) 4 MG/ML IV SOLN
4.0000 mg | INTRAVENOUS | Status: DC | PRN
  Administered 2022-01-30 (×2): 4 mg via INTRAVENOUS
  Filled 2022-01-30 (×2): qty 1

## 2022-02-02 ENCOUNTER — Telehealth: Payer: Self-pay

## 2022-02-02 NOTE — Telephone Encounter (Signed)
Pat is calling from the funeral home asking if Dr.Jones will sign off on the death certificate in [facility].  

## 2022-02-03 ENCOUNTER — Telehealth: Payer: Self-pay | Admitting: Internal Medicine

## 2022-02-03 NOTE — Telephone Encounter (Signed)
There is a death certificate in the [place] death certificate system that you need to fill out.

## 2022-02-06 LAB — DIC (DISSEMINATED INTRAVASCULAR COAGULATION)PANEL
D-Dimer, Quant: 6.93 ug/mL-FEU — ABNORMAL HIGH (ref 0.00–0.50)
Fibrinogen: 92 mg/dL — CL (ref 210–475)
INR: 3.4 — ABNORMAL HIGH (ref 0.8–1.2)
Platelets: 21 10*3/uL — CL (ref 150–400)
Prothrombin Time: 34.3 seconds — ABNORMAL HIGH (ref 11.4–15.2)
Smear Review: NONE SEEN
aPTT: 72 seconds — ABNORMAL HIGH (ref 24–36)

## 2022-02-06 LAB — CBC WITH DIFFERENTIAL/PLATELET
Abs Immature Granulocytes: 0.1 10*3/uL — ABNORMAL HIGH (ref 0.00–0.07)
Basophils Absolute: 0 10*3/uL (ref 0.0–0.1)
Basophils Relative: 0 %
Eosinophils Absolute: 0.2 10*3/uL (ref 0.0–0.5)
Eosinophils Relative: 3 %
HCT: 23.9 % — ABNORMAL LOW (ref 39.0–52.0)
Hemoglobin: 8.2 g/dL — ABNORMAL LOW (ref 13.0–17.0)
Immature Granulocytes: 1 %
Lymphocytes Relative: 6 %
Lymphs Abs: 0.4 10*3/uL — ABNORMAL LOW (ref 0.7–4.0)
MCH: 32.2 pg (ref 26.0–34.0)
MCHC: 34.3 g/dL (ref 30.0–36.0)
MCV: 93.7 fL (ref 80.0–100.0)
Monocytes Absolute: 1 10*3/uL (ref 0.1–1.0)
Monocytes Relative: 14 %
Neutro Abs: 5.5 10*3/uL (ref 1.7–7.7)
Neutrophils Relative %: 76 %
Platelets: 23 10*3/uL — CL (ref 150–400)
RBC: 2.55 MIL/uL — ABNORMAL LOW (ref 4.22–5.81)
RDW: 16.5 % — ABNORMAL HIGH (ref 11.5–15.5)
WBC: 7.2 10*3/uL (ref 4.0–10.5)
nRBC: 0 % (ref 0.0–0.2)

## 2022-02-25 NOTE — Progress Notes (Signed)
Pt expired at 2055, MD notified by charge nurse, family and spouse at bedside. [place] donor services notified, Bed placement notified. Belongings with family with exceptions of rings a wedding band on right hand and ring on left hand. Unable to remove due to swelling. Family wants rings to stay with patient until they are able to obtain them.  ICU charge nurse assisted with removal of HD cath.  Pt had excessive bleeding with removal. Rectal left in place due excessive bleeding.  AC Lori made aware.

## 2022-02-25 NOTE — Progress Notes (Signed)
Upon chart review and discussion with nursing staff we are notified patient has been transitioned to full comfort care measures.  [facility] will sign off, thank you for including [place] in his care.  Charlott Rakes, PA-C

## 2022-02-25 NOTE — Progress Notes (Incomplete)
Pt expired at 2055, MD notified by charge nurse, family and spouse at bedside. [place] donor services notified, Bed placement notified. Belongings with family with exceptions of rings a wedding band on right hand and ring on left hand. Unable to remove due to swelling. Family wants rings to stay with patient until they are able to obtain them.  ICU charge nurse assisted with removal of HD cath.  Pt had excessive bleeding with removal. Rectal left in place due excessive bleeding.  Cecille Rubin

## 2022-02-25 NOTE — Progress Notes (Signed)
NAME:  Leroy Bell, MRN:  [id number], DOB:  1961/03/05, LOS: 6 ADMISSION DATE:  01/23/2022, CONSULTATION DATE:  01/27/22 REFERRING MD:  Dwyane Dee, MD CHIEF COMPLAINT:  GI Bleeding/Shock   History of Present Illness:  Leroy Bell is a 61 year old male with alcoholic cirrhosis with ascites, hepatic encephalopathy, esophageal varices, portal hypertensive gastropathy, hepatorenal syndrome and significant coagulopathy who was admitted 9/30 for GI bleeding. CTA was negative on admission for active extravasation. He was supported with multiple blood product transfusions. PCCM consulted this morning for HD catheter placement to pursue HD for hepatorenal syndrome but he started to have further GI bleeding with development of hypotension.   He is to have last drank on 01/09/22. MElD-Na 40 points, Maddrey DF 111 points.   Patient wishes to continue aggressive care measures. Family is at the bedside and understands the gravity of the situation.   Pertinent  Medical History   Past Medical History:  Diagnosis Date   Alcohol abuse    Cirrhosis ([place])    Coronary atherosclerosis due to calcified coronary lesion    Diabetes ([place])    Dyspnea    from anemia   Hyperlipidemia    Hypertension    Significant Hospital Events: Including procedures, antibiotic start and stop dates in addition to other pertinent events   9/30 admitted for lower GI bleeding 10/3 PCCM consulted for shock due to recurrent GI bleeding. Vit K, DDAVP, PRBC, FFP, Cryo. HD cath placed for 3-5d trial RRT. got 1 PRBC overnight 10/4 CRRT. Off NE. 1 PRBC. DNR 10/5 1 plt 1 cryo. Started hemorrhaging again late in shift. Transitioned to comfort care  10/6 comfort care   Interim History / Subjective:   BP continues to down-trend  Flexiseal collecting blood   Wife, son and daughter at bedside    Objective   Blood pressure 118/60, pulse 72, temperature 98.2 F (36.8 C), temperature source Axillary, resp. rate 12, height 5'  7" (1.702 m), weight 84.5 kg, SpO2 100 %.        Intake/Output Summary (Last 24 hours) at 02/21/2022 0713 Last data filed at 01/29/2022 1812 Gross per 24 hour  Intake 1491.18 ml  Output 3366 ml  Net -1874.82 ml   Filed Weights   01/22/2022 1300 01/28/22 0500 01/29/22 0500  Weight: 88.5 kg 84.9 kg 84.5 kg    Examination: General: Ill appearing middle aged M  HENT: NCAT pink tacky mm  Lungs: abdominal muscle recruitment. Cardiovascular: rrr  GI : distended abdomen. Flexiseal with bloody output  Extremities: Pitting edema  Neuro: somnolent  GU: Foley, dark urine  Skin: Jaundice   Resolved Hospital Problem list     Assessment & Plan:   Goals of care / encounter for palliative care DNR  Decompensated alcoholic cirrhosis with ascites Hepatic encephalopathy without coma Hemorrhagic shock  Acute blood loss anemia due to GIB, with rectal varices Coagulopathy Thrombocytopenia Fibrinogenemia  Hyperbilirubinemia Jaundice Acute renal failure -- Hepatorenal syndrome, ATN  DM2 Anxiety Sleep disturbance Hx OSA  P -DNR status -Transitioned to comfort care 10/5 evening with recurrence of GI hemorrhage -flexiseal placed for blood collection -- think this is actually tamponading a bit of his bleeding. Would continue this to prevent distress for pt/family from visual appearance of hemorrhage  -maintain foley  -ok to keep HD cath for med administration  -Cont meds for comfort optimization -- currently PRN morphine PRN versed and PRN dilaudid are ordered. These can be increased in frequency/dose, or changed to gtt as needed  -  will transfer to palliative floor  -palliative care consult -- I anticipate in-hospital death in the coming day or so, but would be useful for consult should he somewhat stabilize and be appropriate for residential hospice     Best Practice (right click and "Reselect all SmartList Selections" daily)   Diet/type: Regular consistency (see orders) DVT  prophylaxis: not indicated GI prophylaxis: N/A and PPI Lines: Central line Foley:  Yes, and it is still needed Code Status:  DNR Last date of multidisciplinary goals of care discussion [10/5]  Labs   CBC: Recent Labs  Lab 01/25/22 1845 01/26/22 0036 01/27/22 0024 01/27/22 2158 01/28/22 0621 01/28/22 1208 01/28/22 1600 01/29/22 0000 01/29/22 0348 01/29/22 0632 01/29/22 1409  WBC 11.7* 9.1 7.6  --  7.8  --   --   --  7.2  --   --   NEUTROABS 9.3* 7.5 6.1  --  6.0  --   --   --  5.5  --   --   HGB 9.2* 8.8* 7.9*   < > 7.5*   < > 6.9* 7.9* 8.2* 8.3* 8.3*  HCT 26.6* 25.6* 23.8*   < > 21.6*   < > 19.8* 22.5* 23.9* 24.0* 24.8*  MCV 97.4 98.5 99.6  --  94.3  --   --   --  93.7  --   --   PLT 30* 30* 32*  --  27*  --  24*  --  21*  23*  --   --    < > = values in this interval not displayed.    Basic Metabolic Panel: Recent Labs  Lab 01/26/22 0036 01/26/22 [id number] 01/27/22 0024 01/27/22 [id number] 01/27/22 2158 01/28/22 [id number] 01/28/22 1708 01/29/22 0348 01/29/22 0349 01/29/22 1633  NA 125*   < > 128*   < > 131* 134* 136  --  136 136  K 5.3*   < > 5.5*   < > 4.7 4.6 4.3  --  4.4 4.5  CL 100   < > 100   < > 102 102 103  --  104 106  CO2 16*   < > 18*   < > 21* 21* 25  --  24 25  GLUCOSE 187*   < > 176*   < > 152* 181* 176*  --  150* 255*  BUN 85*   < > 96*   < > 77* 60* 43*  --  33* 25*  CREATININE 4.24*   < > 5.23*   < > 4.68* 3.30* 2.15*  --  1.52* 1.15  CALCIUM 6.9*   < > 7.2*   < > 6.9* 7.1* 7.7*  --  7.8* 7.9*  MG 2.8*  --  3.1*  --   --  2.7*  --  2.7*  --   --   PHOS 7.7*  --  8.2*  --  6.0* 4.6 3.8  --  3.8 3.6   < > = values in this interval not displayed.   GFR: Estimated Creatinine Clearance: 71.9 mL/min (by C-G formula based on SCr of 1.15 mg/dL). Recent Labs  Lab 01/02/2022 0430 12/27/2021 1019 01/25/22 0039 01/25/22 1845 01/26/22 0036 01/27/22 0024 01/28/22 0621 01/29/22 0348  PROCALCITON  --   --  0.98  --   --   --   --   --   WBC  --   --  15.9*   < > 9.1  7.6 7.8 7.2  LATICACIDVEN 3.3* 3.8* 2.7*  --   --   --   --   --    < > =  values in this interval not displayed.    Liver Function Tests: Recent Labs  Lab 01/26/22 1804 01/27/22 0024 01/27/22 0621 01/27/22 2158 01/28/22 1022 01/28/22 1708 01/29/22 0348 01/29/22 0349 01/29/22 1633  AST 137* 137* 144*  --  109*  --  90*  --   --   ALT 111* 115* 112*  --  87*  --  73*  --   --   ALKPHOS 62 74 70  --  66  --  63  --   --   BILITOT 30.7* 33.1* 32.9*  --  27.4*  --  27.5*  --   --   PROT 5.0* 5.1* 5.0*  --  4.8*  --  5.0*  --   --   ALBUMIN 3.5 3.4* 3.4*   < > 3.5 4.3 3.9 3.9 3.8   < > = values in this interval not displayed.   No results for input(s): "LIPASE", "AMYLASE" in the last 168 hours. Recent Labs  Lab 02/16/2022 0430 01/25/22 1208  AMMONIA 50* 55*    ABG    Component Value Date/Time   PHART 7.417 08/02/2018 1050   PCO2ART 37.4 08/02/2018 1050   PO2ART 72.2 (L) 08/02/2018 1050   HCO3 20.0 Feb 16, 2022 0411   ACIDBASEDEF 5.2 (H) 02/16/2022 0411   O2SAT 81.6 02-16-2022 0411     Coagulation Profile: Recent Labs  Lab 01/25/22 0039 01/27/22 0024 01/28/22 0621 01/28/22 1600 01/29/22 0348  INR 2.9* 3.7* 3.2* 3.6* 3.4*  3.3*    Cardiac Enzymes: No results for input(s): "CKTOTAL", "CKMB", "CKMBINDEX", "TROPONINI" in the last 168 hours.  HbA1C: Hgb A1c MFr Bld  Date/Time Value Ref Range Status  01/21/2022 05:45 AM 5.9 (H) 4.8 - 5.6 % Final    Comment:    (NOTE) Pre diabetes:          5.7%-6.4%  Diabetes:              >6.4%  Glycemic control for   <7.0% adults with diabetes   01/29/2021 02:06 PM 5.8 4.6 - 6.5 % Final    Comment:    Glycemic Control Guidelines for People with Diabetes:Non Diabetic:  <6%Goal of Therapy: <7%Additional Action Suggested:  >8%     CBG: Recent Labs  Lab 01/28/22 1547 01/28/22 2019 01/29/22 0804 01/29/22 1147 01/29/22 1715  GLUCAP 161* 196* 129* 166* 224*   CCT - n/a   Eliseo Gum MSN, AGACNP-BC [place] for pager  02/07/2022, 8:45 AM

## 2022-02-25 NOTE — Death Summary Note (Signed)
DEATH SUMMARY   Patient Details  Name: Leroy Bell MRN: [id number] DOB: October 22, 1960  Admission/Discharge Information   Admit Date:  01-29-22  Date of Death: Date of Death: 2022/02/04  Time of Death: Time of Death: 08-04-53  Length of Stay: 6  Referring Physician: Janith Lima, MD   Reason(s) for Hospitalization  Gastrointestinal bleeding  Diagnoses  Preliminary cause of death:  Gastrointestinal Bleeding Liver Failure  Secondary Diagnoses (including complications and co-morbidities):  Principal Problem:   GI bleeding Active Problems:   Alcohol abuse   OSA on CPAP   Decompensated hepatic cirrhosis ([name])   Hyperlipidemia LDL goal <70   Essential hypertension   Acute kidney injury (nontraumatic) ([name])   Thrombocytopenia ([name])   Controlled type 2 diabetes mellitus without complication, without long-term current use of insulin ([name])   SIRS (systemic inflammatory response syndrome) ([name])   Hyperkalemia   Hyponatremia   Metabolic acidosis   Goals of care, counseling/discussion   Alcoholic cirrhosis of liver with ascites ([place])   Hepatic encephalopathy ([place])   DNR (do not resuscitate) discussion   Coagulopathy ([place])   Hyperbilirubinemia   Brief Hospital Course (including significant findings, care, treatment, and services provided and events leading to death)  Leroy Bell is a 61 y.o. year old male with alcoholic cirrhosis with ascites, hepatic encephalopathy, esophageal varices, portal hypertensive gastropathy, hepatorenal syndrome and significant coagulopathy who was admitted 01/30/23 for GI bleeding. CTA was negative on admission for active extravasation. He was supported with multiple blood product transfusions. PCCM was consulted 10/3 for placement of hemodialysis catheter to perform CRRT. His condition declined with frank bleeding from his recturm with development of shock. CTA abdommen/pelvis was repeated with extravasation noted in the rectum with no safe embolization  procedure available by IR and no GI procedure available. Patient was started on vasopressor support and provided appropriate transfusion products. He was started on CRRT for renal failure. On 10/4 patient and family decided to change code status to DNR about on going discussions on his prognosis. Patient developed acute GI bleeding again on 10/5 with increasing encephalopathy, hypotension, and work of breathing. Discussions were held with the family with the decision to transition to comfort care due to poor overall prognosis and limited medical interventions available. Patient passed away 04-Feb-2022 at 08/04/53.   Pertinent Labs and Studies  Significant Diagnostic Studies DG CHEST PORT 1 VIEW  Result Date: 01/27/2022 CLINICAL DATA:  Central line placement EXAM: PORTABLE CHEST 1 VIEW COMPARISON:  None Available. FINDINGS: RIGHT central venous line with tip in the distal SVC. No pneumothorax. Stable cardiac silhouette. Moderate LEFT effusion. Mild venous congestion centrally. IMPRESSION: 1. RIGHT central venous line proper position. 2. No pneumothorax. 3. Mild congestion/edema pattern Electronically Signed   By: Suzy Bouchard M.D.   On: 01/27/2022 15:33   CT ANGIO GI BLEED  Addendum Date: 01/27/2022   ADDENDUM REPORT: 01/27/2022 13:04 ADDENDUM: These results were called by telephone on 01/27/2022 at 1:04 pm to Dr Dwyane Dee who verbally acknowledged these results. Electronically Signed   By: Valentino Saxon M.D.   On: 01/27/2022 13:04   Result Date: 01/27/2022 CLINICAL DATA:  lower GI bleed EXAM: CTA ABDOMEN AND PELVIS WITHOUT AND WITH CONTRAST TECHNIQUE: Multidetector CT imaging of the abdomen and pelvis was performed using the standard protocol during bolus administration of intravenous contrast. Multiplanar reconstructed images and MIPs were obtained and reviewed to evaluate the vascular anatomy. RADIATION DOSE REDUCTION: This exam was performed according to the departmental dose-optimization program  which includes automated exposure control, adjustment of the mA and/or kV according to patient size and/or use of iterative reconstruction technique. CONTRAST:  154mL OMNIPAQUE IOHEXOL 350 MG/ML SOLN COMPARISON:  January 24, 2022 FINDINGS: VASCULAR Aorta: Normal caliber aorta without aneurysm, dissection, vasculitis or significant stenosis. Celiac: Celiac and SMV branch from the same origin trunk. Patent. Atherosclerotic calcifications at its origin. SMA: Celiac and SMV branch from the same origin trunk. Patent. Atherosclerotic calcifications at its origin. Renals: Patent. IMA: Patent. Inflow: Patent.  Minimal atherosclerotic calcifications. Proximal Outflow: Patent.  Minimal atherosclerotic calcifications. Veins: No obvious venous abnormality of the IVC. Review of the MIP images confirms the above findings. NON-VASCULAR Lower chest: Small LEFT pleural effusion. Near complete LEFT lower lobe atelectasis. Hepatobiliary: Nodular contours of the liver consistent with underlying cirrhosis. Excreted contrast within the gallbladder. No extrahepatic biliary ductal dilation. Pancreas: Mildly increased fluid tracking adjacent to the pancreas, nonspecific in the setting of ascites. Spleen: Splenomegaly.  Multiple splenic collaterals. Adrenals/Urinary Tract: Adrenal glands are unremarkable. No hydronephrosis. Subcentimeter hypodense lesions are too small to accurately characterize. No obstructing nephrolithiasis. Bladder is completely decompressed around a Foley catheter. Stomach/Bowel: Evaluation is limited by intrinsically hyperdense material throughout the stomach and colon. There is serpiginous contrast noted in the region of the rectum and anus. This progressively fills on delayed images and likely reflects bleeding from internal hemorrhoids/rectal collaterals. Multiple paraesophageal and gastric varices. Bowel wall thickening of the sigmoid colon, likely a nonspecific colitis. No evidence of bowel obstruction. No  evidence of appendicitis. Lymphatic: No new lymphadenopathy. Reproductive: Prostate is unremarkable. Other: Moderate volume ascites. Musculoskeletal: No acute osseous abnormality. IMPRESSION: VASCULAR 1. There is serpiginous contrast noted in the region of the rectum and anus. The rectum lumen progressively fills on delayed images and likely reflects bleeding from internal hemorrhoids/rectal varices. NON-VASCULAR 1. Cirrhosis with sequela of portal venous hypertension including splenomegaly, moderate volume ascites, paraesophageal and gastric varices. 2. Mild bowel wall thickening of the sigmoid colon could reflect a nonspecific colitis in the appropriate clinical setting. Recommend correlation with colon cancer screening history. 3. Small LEFT pleural effusion with near complete LEFT lower lobe atelectasis. 4. Mildly increased fluid adjacent to the pancreas, nonspecific in the setting of ascites. Recommend correlation with lipase levels. PRA system was activated at the time of interpretation on 01/27/2022 at 12:48 pm. Addendum will be issued upon telephonic communication. Electronically Signed: By: Valentino Saxon M.D. On: 01/27/2022 12:55   US Paracentesis  Result Date: 01/25/2022 INDICATION: Patient with history of alcohol abuse presents with abdominal distension, jaundice, previous CT showed ascites. Request for therapeutic and diagnostic paracentesis. EXAM: ULTRASOUND GUIDED  PARACENTESIS MEDICATIONS: 10 mL 1% lidocaine COMPLICATIONS: None immediate. PROCEDURE: Informed written consent was obtained from the patient after a discussion of the risks, benefits and alternatives to treatment. A timeout was performed prior to the initiation of the procedure. Initial ultrasound scanning demonstrates a large amount of ascites within the right lower abdominal quadrant. The right lower abdomen was prepped and draped in the usual sterile fashion. 1% lidocaine was used for local anesthesia. Following this, a 19 gauge,  7-cm, Yueh catheter was introduced. An ultrasound image was saved for documentation purposes. The paracentesis was performed. The catheter was removed and a dressing was applied. The patient tolerated the procedure well without immediate post procedural complication. FINDINGS: A total of approximately 5.1 L of hazy, golden colored fluid was removed. Samples were sent to the laboratory as requested by the clinical team. IMPRESSION: Successful ultrasound-guided paracentesis yielding 5.1  liters of peritoneal fluid. PLAN: If the patient eventually requires >/=2 paracenteses in a 30 day period, candidacy for formal evaluation by the [facility] will be assessed. Read by: Durenda Guthrie, PA-C Electronically Signed   By: Corrie Mckusick D.O.   On: 01/25/2022 10:56   CT ANGIO GI BLEED  Result Date: 01/18/2022 CLINICAL DATA:  Hematochezia, coffee-ground emesis. Prior bloody stool and abdominal distention. EXAM: CTA ABDOMEN AND PELVIS WITHOUT AND WITH CONTRAST TECHNIQUE: Multidetector CT imaging of the abdomen and pelvis was performed using the standard protocol during bolus administration of intravenous contrast. Multiplanar reconstructed images and MIPs were obtained and reviewed to evaluate the vascular anatomy. RADIATION DOSE REDUCTION: This exam was performed according to the departmental dose-optimization program which includes automated exposure control, adjustment of the mA and/or kV according to patient size and/or use of iterative reconstruction technique. CONTRAST:  167mL OMNIPAQUE IOHEXOL 350 MG/ML SOLN COMPARISON:  06/29/2020. FINDINGS: VASCULAR Aorta: Aortic atherosclerosis. Normal caliber aorta without aneurysm, dissection, vasculitis or significant stenosis. Celiac: The celiac artery and SMA branch from the same trunk which is a normal variant. The arteries are patent without evidence of aneurysm, dissection, vasculitis, or significant stenosis. SMA: The celiac  artery and SMA branch from the same trunk which is a normal variant. The arteries are patent without evidence of aneurysm, dissection, vasculitis, or significant stenosis. Renals: Both renal arteries are patent without evidence of aneurysm, dissection, vasculitis, fibromuscular dysplasia or significant stenosis. IMA: Patent without evidence of aneurysm, dissection, vasculitis or significant stenosis. Inflow: Patent without evidence of aneurysm, dissection, vasculitis or significant stenosis. Proximal Outflow: Bilateral common femoral and visualized portions of the superficial and profunda femoral arteries are patent without evidence of aneurysm, dissection, vasculitis or significant stenosis. Veins: Multiple esophageal, periesophageal and perigastric varices are noted in the upper abdomen. The portal vein, splenic vein, and superior mesenteric vein are patent. Review of the MIP images confirms the above findings. NON-VASCULAR Lower chest: Heart is enlarged and there is a small pericardial effusion. Multi-vessel coronary artery calcifications are noted. There is a small left pleural effusion and trace right pleural effusion with atelectasis at the lung bases bilaterally. Multiple varices are noted in the periesophageal region in the lower thorax. Hepatobiliary: No focal abnormality. The liver has a nodular contour compatible with cirrhosis. No biliary ductal dilatation. The gallbladder is contracted. Pancreas: Unremarkable. No pancreatic ductal dilatation or surrounding inflammatory changes. Spleen: The spleen is enlarged at 13.4 cm. Adrenals/Urinary Tract: The adrenal glands are within normal limits. The kidneys enhance symmetrically. Multiple scattered hypodensities are present in the kidneys, possible cysts. No renal calculus or hydronephrosis. Stomach/Bowel: Multiple esophageal and perigastric varices. There is thickening of the gastric rugae. Hyperdense material is present at the gastric fundus and proximal  small bowel, possible contrast or other ingested material. No obvious hemorrhage is seen. No bowel obstruction, free air, or pneumatosis. The appendix is not seen. Lymphatic: No abdominal or pelvic lymphadenopathy. Reproductive: Prostate is unremarkable. Other: Moderate ascites. Musculoskeletal: Mild anasarca is noted. No acute or suspicious osseous abnormality. IMPRESSION: VASCULAR 1. No evidence of active hemorrhage. 2. Multiple esophageal, paraesophageal and perigastric varices. 3. Aortic atherosclerosis. NON-VASCULAR 1. Hyperdense material in the stomach and proximal small bowel, which may represent ingested material. Clinical correlation is recommended. No obvious hemorrhage is identified. 2. Morphologic changes of cirrhosis and portal hypertension. 3. Moderate ascites and anasarca. 4. Small left pleural effusion and trace right pleural effusion with atelectasis at the lung bases. 5. Scattered hypodensities in  the kidneys bilaterally, previously characterized as cysts. Electronically Signed   By: Brett Fairy M.D.   On: 12/30/2021 21:05   US RENAL  Result Date: 12/31/2021 CLINICAL DATA:  Acute kidney injury. EXAM: RENAL / URINARY TRACT ULTRASOUND COMPLETE COMPARISON:  None Available. FINDINGS: Right Kidney: Renal measurements: 11.7 x 5.0 x 6.5 cm = volume: 195.5 mL. Increased parenchymal echogenicity. No mass or hydronephrosis visualized. Left Kidney: Renal measurements: 10.4 x 5.4 x 6.3 cm = volume: 182.9 mL. Echogenicity within normal limits. No mass or hydronephrosis visualized. Bladder: The ureteral jets are not visualized. There is abnormal wall thickening identified along the dome of the bladder measuring 1.1 cm. Other: None. IMPRESSION: 1. No hydronephrosis. 2. Increased parenchymal echogenicity of the right kidney suggestive of chronic medical renal disease. 3. Abnormal wall thickening along the dome of the bladder measuring 1.1 cm. Cannot exclude a bladder mass. Consider further evaluation with  cystoscopy. Electronically Signed   By: Kerby Moors M.D.   On: 01/06/2022 13:34    Microbiology No results found for this or any previous visit (from the past 240 hour(s)).  Lab Basic Metabolic Panel: No results for input(s): "NA", "K", "CL", "CO2", "GLUCOSE", "BUN", "CREATININE", "CALCIUM", "MG", "PHOS" in the last 168 hours. Liver Function Tests: No results for input(s): "AST", "ALT", "ALKPHOS", "BILITOT", "PROT", "ALBUMIN" in the last 168 hours. No results for input(s): "LIPASE", "AMYLASE" in the last 168 hours. No results for input(s): "AMMONIA" in the last 168 hours. CBC: No results for input(s): "WBC", "NEUTROABS", "HGB", "HCT", "MCV", "PLT" in the last 168 hours. Cardiac Enzymes: No results for input(s): "CKTOTAL", "CKMB", "CKMBINDEX", "TROPONINI" in the last 168 hours. Sepsis Labs: No results for input(s): "PROCALCITON", "WBC", "LATICACIDVEN" in the last 168 hours.  Procedures/Operations  [employer] CRRT   Freddi Starr 02/16/2022, 8:43 AM

## 2022-02-25 NOTE — Consult Note (Signed)
                                                                                   Consultation Note Date: 02/16/2022   Patient Name: Leroy Bell  DOB: 1961-01-14  MRN: [id number]  Age / Sex: 61 y.o., male  PCP: Janith Lima, MD Referring Physician: Maryjane Hurter, MD  Reason for Consultation:  terminal care  HPI/Patient Profile: 61 y.o. male  with past medical history of alcoholic cirrhosis with ascites, hepatic encephalopathy, esophageal varices, portal hypertensive gastropathy, hepatorenal syndrome and coagulopathy admitted on 02/04/22 with GI bleeding. He required multiple transfusions and was being considered for HD, however he subsequently developed further GI bleeding with rectal hemorrhaging, and was transitioned to full comfort by attending team.     Primary Decision Maker NEXT OF KIN- spouse  Discussion: Chart reviewed including labs, progress notes, imaging. Patient is minimally responsive. His spouse and children are at bedside.  Discussed comfort measures and dying process. Educated re: nonverbal signs to look for and let nursing know so appropriate medications can be administered.  Discussed possible transfer to inpatient hospice. He appears stable for transfer, but not likely to survive more than a few days. Family would prefer to avoid transfer and keep him in the hospital for his transition.    SUMMARY OF RECOMMENDATIONS -Continue comfort measures -Anticipate hospital death -Appeared to be using accessory muscles s/p morphine 2mg  IV- will increase morphine IV to 4mg  q15 min prn    Code Status/Advance Care Planning: DNR   Prognosis:   Hours - Days  Discharge Planning: Anticipated Hospital Death  Primary Diagnoses: Present on Admission:  GI bleeding  Alcohol abuse  Hyperlipidemia LDL goal <70  Essential hypertension  Thrombocytopenia ([name])   Review of Systems  Physical Exam  Vital Signs: BP 124/61 (BP Location: Right Arm)   Pulse 69   Temp  98.2 F (36.8 C) (Axillary)   Resp 11   Ht 5\' 7"  (1.702 m)   Wt 84.5 kg   SpO2 100%   BMI 29.18 kg/m  Pain Scale: CPOT   Pain Score: Asleep   SpO2: SpO2: 100 % O2 Device:SpO2: 100 % O2 Flow Rate: .O2 Flow Rate (L/min): 4 L/min  IO: Intake/output summary:  Intake/Output Summary (Last 24 hours) at 02/23/2022 1048 Last data filed at 01/29/2022 1812 Gross per 24 hour  Intake 881.15 ml  Output 2400 ml  Net -1518.85 ml    LBM: Last BM Date : 01/29/22 Baseline Weight: Weight: 88.5 kg Most recent weight: Weight: 84.5 kg       Thank you for this consult. Palliative medicine will continue to follow and assist as needed.   Greater than 50%  of this time was spent counseling and coordinating care related to the above assessment and plan.  Signed by: Mariana Kaufman, AGNP-C Palliative Medicine    Please contact Palliative Medicine Team phone at [phone] for questions and concerns.  For individual provider: See Shea Evans

## 2022-02-25 DEATH — deceased

## 2022-07-26 IMAGING — MR MR ABDOMEN WO/W CM MRCP
13 of 19 series · 31 of 48 positions shown · IV contrast (multihance)
Comparison: 05/29/2020 abdominal ultrasound.

CLINICAL DATA: Elevated liver function tests. Abnormal ultrasound
demonstrating hepatic and gallbladder abnormalities. Indeterminate
right renal lesion.

EXAM:
MRI ABDOMEN WITHOUT AND WITH CONTRAST (INCLUDING MRCP)
TECHNIQUE: Multiplanar multisequence MR imaging of the abdomen was performed
both before and after the administration of intravenous contrast.
Heavily T2-weighted images of the biliary and pancreatic ducts were
obtained, and three-dimensional MRCP images were rendered by post
processing.
CONTRAST:  16mL MULTIHANCE GADOBENATE DIMEGLUMINE 529 MG/ML IV SOLN

[Series 3: T2 · coronal · 5.0mm · 1.56mm/px · 2 of 36 slices shown (1 of 3)]
[im 1/36]
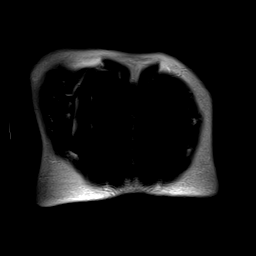
[im 36/36]
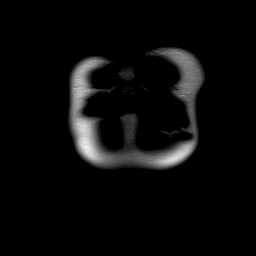

[Series 4: T2 · axial · 6.0mm · 1.56mm/px · z∈[-187,+61]mm · 2 of 37 slices shown (2 of 3)]
[im 1/37]
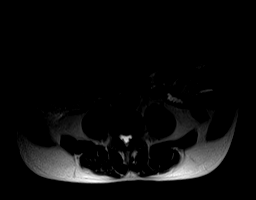
[im 37/37]
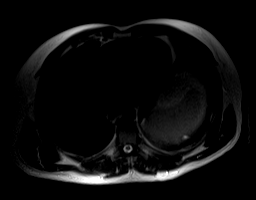

[Series 7: T2 · axial · 6.0mm · 0.78mm/px · z∈[-184,+85]mm · 2 of 40 slices shown (3 of 3)]
[im 1/40]
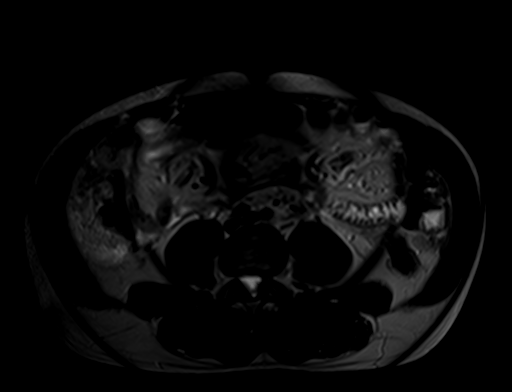
[im 40/40]
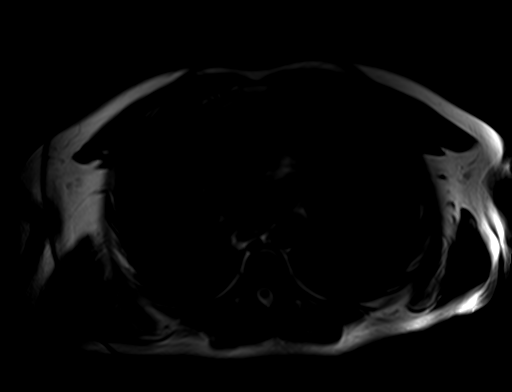

[Series 13: ep2d_[id number]_500_800_p2 · axial · 6.0mm · 2.08mm/px · z∈[-184,+85]mm · 4 of 120 slices shown]
[im 1/120]
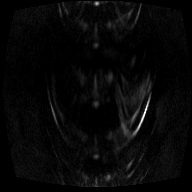
[im 40/120]
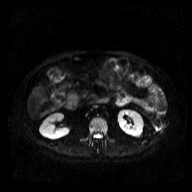
[im 80/120]
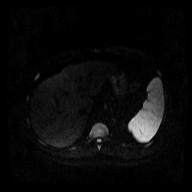
[im 120/120]
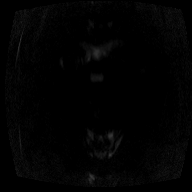

[Series 14: ep2d_[id number]_adc · axial · 6.0mm · 2.08mm/px · 1 of 40 slices shown]
[im 1/40]
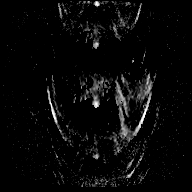

[Series 15: axial tru fisp · axial · 5.0mm · 1.56mm/px · z∈[-192,+66]mm · 2 of 44 slices shown]
[im 1/44]
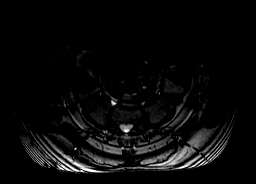
[im 44/44]
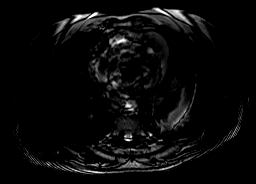

[Series 16: axial in out · axial · 6.0mm · 0.78mm/px · z∈[-194,+68]mm · 3 of 78 slices shown]
[im 1/78]
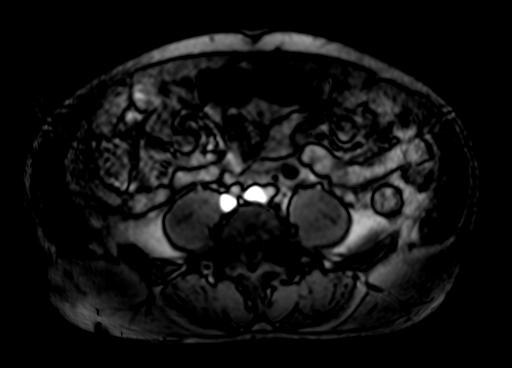
[im 39/78]
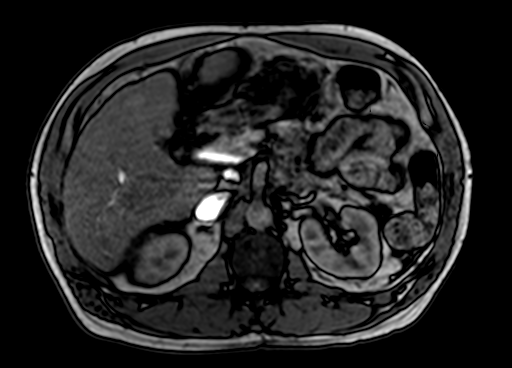
[im 78/78]
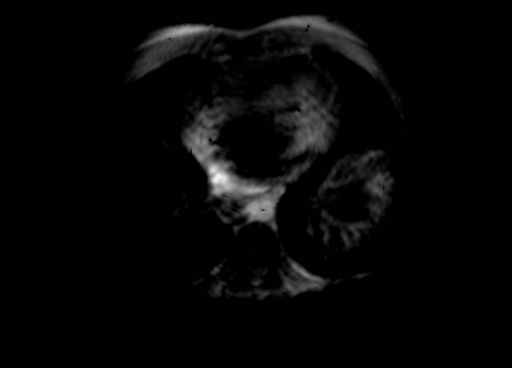

[Series 17: cor haste fs · coronal · 3.0mm · 0.70mm/px · 1 of 21 slices shown]
[im 1/21]
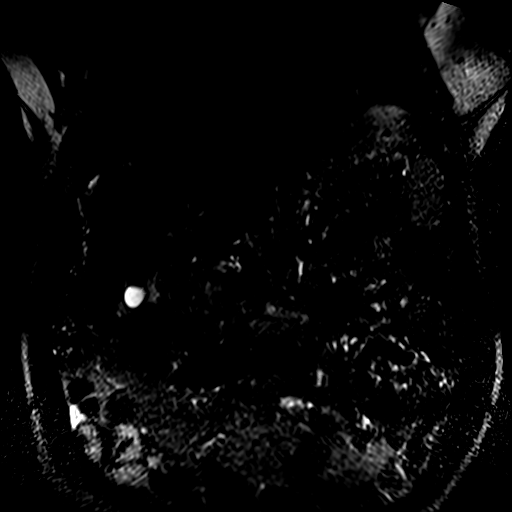

[Series 18: T1 dynamic · axial · non-contrast · 2.5mm · 0.78mm/px · z∈[-182,+56]mm · 3 of 96 slices shown]
[im 1/96]
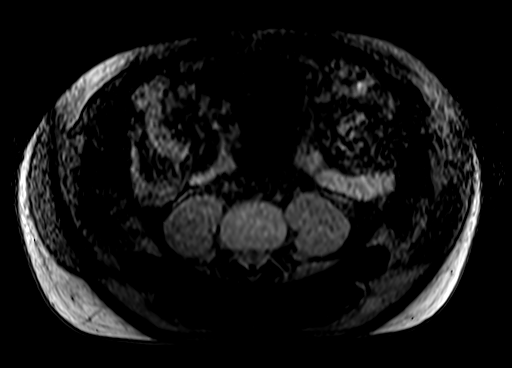
[im 48/96]
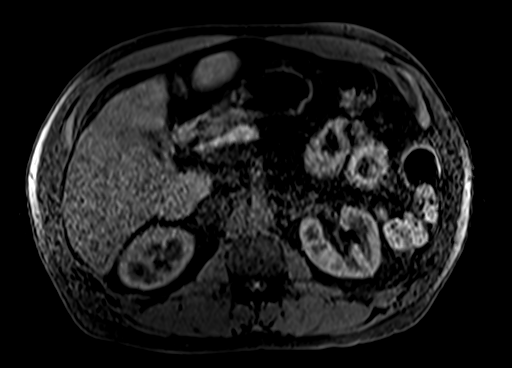
[im 96/96]
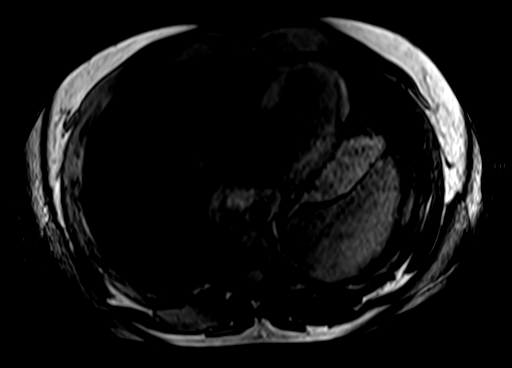

[Series 19: post 25 sec · axial · 2.5mm · 0.78mm/px · z∈[-182,+56]mm · 3 of 96 slices shown]
[im 1/96]
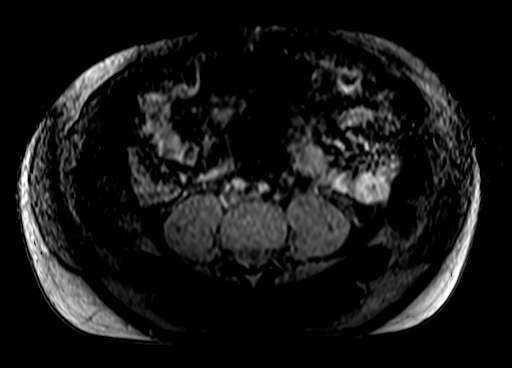
[im 48/96]
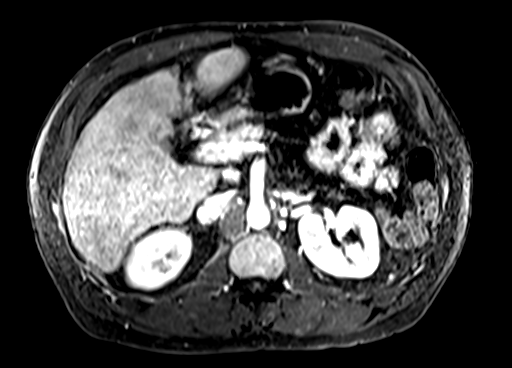
[im 96/96]
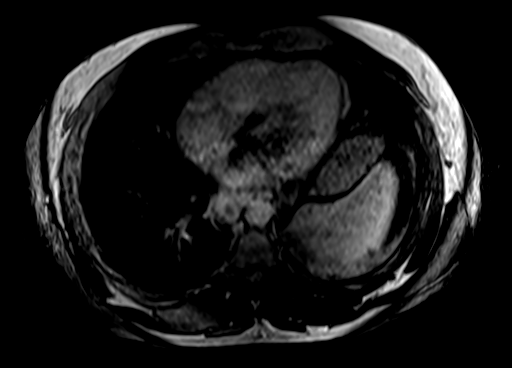

[Series 20: post 25 sec_sub · axial · 2.5mm · 0.78mm/px · z∈[-182,+56]mm · 3 of 96 slices shown]
[im 1/96]
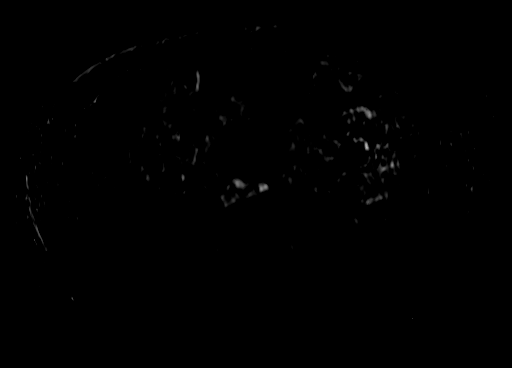
[im 48/96]
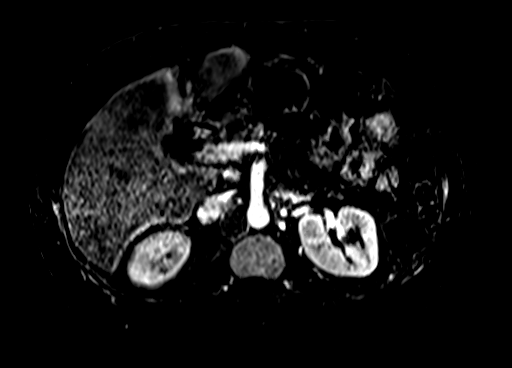
[im 96/96]
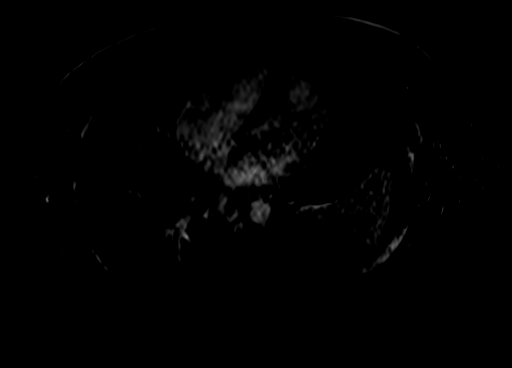

[Series 21: post 45 sec · axial · 2.5mm · 0.78mm/px · z∈[-182,+56]mm · 3 of 96 slices shown]
[im 1/96]
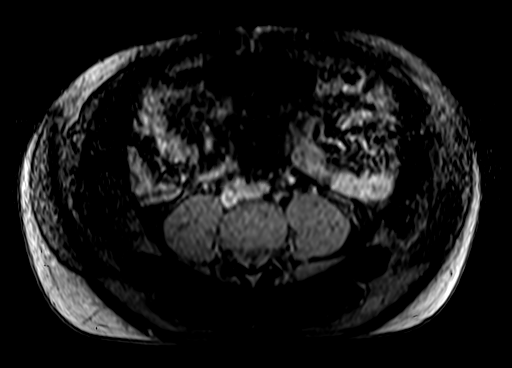
[im 48/96]
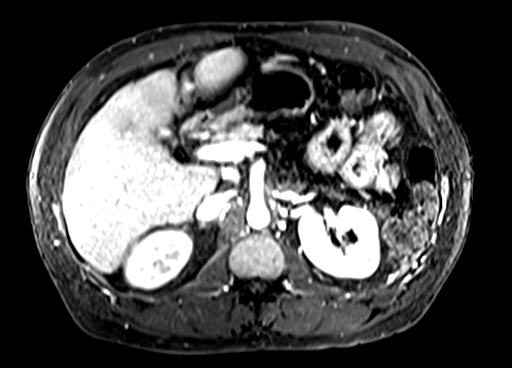
[im 96/96]
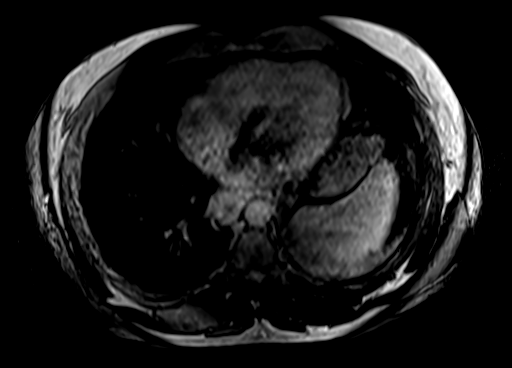

[Series 22: post 45 sec_sub · axial · 2.5mm · 0.78mm/px · z∈[-182,-64]mm · 2 of 96 slices shown]
[im 1/96]
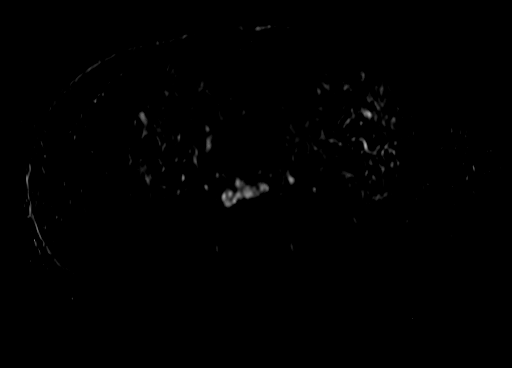
[im 48/96]
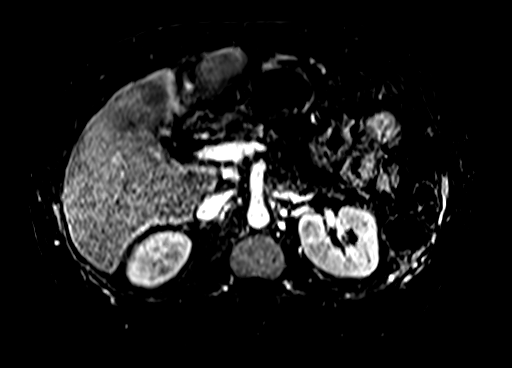

[31 of 48 positions shown; findings below may reference images not displayed]

FINDINGS: Portions of exam are mildly motion degraded.

Lower chest: Moderate left hemidiaphragm elevation. Normal heart
size. No pericardial or pleural effusion. Periesophageal varices.

Hepatobiliary: Moderate cirrhosis. No suspicious liver lesion. No
gallbladder abnormality or dominant mass given above limitations. No
biliary duct dilatation or choledocholithiasis.

Pancreas: No evidence of acute pancreatitis. At least 1 cystic
lesion identified within the pancreatic head and neck, including at
3 mm on [DATE] and 5 mm on 48/8. No suspicious postcontrast
characteristics.

Spleen:  Measures 12.2 x 13.5 x 7.5 cm (volume = 650 cm^3)

Adrenals/Urinary Tract: Normal adrenal glands. Bilateral small renal
cyst cysts. No suspicious renal lesion or correlate for the
ultrasound abnormality. No hydronephrosis.

Stomach/Bowel: Normal stomach and colon. Apparent jejunal fold
thickening including on 65/23, mild.

Vascular/Lymphatic: Aortic atherosclerosis. No splenic artery
aneurysm. Portal venous hypertension, with portosystemic collaterals
in the left upper quadrant on 41/23. Patent portal and splenic
veins. Patent SMV and hepatic veins.

No abdominal adenopathy.

Other:  Trace perihepatic and perisplenic ascites.

Musculoskeletal: No acute osseous abnormality.
IMPRESSION: 1. Mildly motion degraded exam.
2. Cirrhosis and portal venous hypertension, without hepatocellular
carcinoma.
3. Given motion degradation, no gallbladder mass or acute process.
4. At least 1 cystic lesion within the pancreatic head neck. Most
likely a pseudocyst versus indolent cystic neoplasm. This could be
re-evaluated on routine follow-up imaging for hepatocellular
carcinoma surveillance. If this is not performed, follow-up with pre
and post contrast abdominal MRI/MRCP at 1 year is recommended. This
recommendation follows ACR consensus guidelines: Management of
Incidental Pancreatic Cysts: A White Paper of the ACR Incidental
Findings Committee. [HOSPITAL] 7873;[DATE].
5. Splenomegaly.
6.  Aortic Atherosclerosis ([id number]-IH1.1).
7. Small-bowel fold thickening could simply be related to
underdistention or portal venous hypertension. Recommend clinical
correlation for enteritis.

## 2022-11-11 ENCOUNTER — Ambulatory Visit: Payer: Managed Care, Other (non HMO) | Admitting: Cardiology
